# Patient Record
Sex: Male | Born: 1960
Health system: Southern US, Community
[De-identification: ages and names within clinical notes are randomized; demographics above are authoritative.]

## PROBLEM LIST (undated history)

## (undated) DIAGNOSIS — I10 Essential (primary) hypertension: Secondary | ICD-10-CM

## (undated) DIAGNOSIS — E785 Hyperlipidemia, unspecified: Secondary | ICD-10-CM

## (undated) HISTORY — PX: REVISION TOTAL HIP ARTHROPLASTY: SHX766

---

## 2013-06-14 DIAGNOSIS — M169 Osteoarthritis of hip, unspecified: Secondary | ICD-10-CM | POA: Insufficient documentation

## 2013-08-02 ENCOUNTER — Ambulatory Visit: Payer: Self-pay

## 2013-08-02 ENCOUNTER — Ambulatory Visit: Payer: BC Managed Care – PPO | Admitting: Physical Therapy

## 2013-08-10 ENCOUNTER — Ambulatory Visit: Payer: BC Managed Care – PPO | Attending: Orthopaedic Surgery | Admitting: Rehabilitation

## 2013-08-10 DIAGNOSIS — IMO0001 Reserved for inherently not codable concepts without codable children: Secondary | ICD-10-CM | POA: Insufficient documentation

## 2013-08-10 DIAGNOSIS — R262 Difficulty in walking, not elsewhere classified: Secondary | ICD-10-CM | POA: Insufficient documentation

## 2013-08-10 DIAGNOSIS — M25659 Stiffness of unspecified hip, not elsewhere classified: Secondary | ICD-10-CM | POA: Insufficient documentation

## 2013-08-10 DIAGNOSIS — M25559 Pain in unspecified hip: Secondary | ICD-10-CM | POA: Insufficient documentation

## 2013-08-15 ENCOUNTER — Ambulatory Visit: Payer: BC Managed Care – PPO | Admitting: Physical Therapy

## 2013-08-18 ENCOUNTER — Ambulatory Visit: Payer: BC Managed Care – PPO | Admitting: Rehabilitation

## 2013-08-22 ENCOUNTER — Ambulatory Visit: Payer: BC Managed Care – PPO | Admitting: Rehabilitation

## 2013-08-24 ENCOUNTER — Ambulatory Visit: Payer: BC Managed Care – PPO | Admitting: Rehabilitation

## 2013-08-25 ENCOUNTER — Ambulatory Visit: Payer: BC Managed Care – PPO | Admitting: Rehabilitation

## 2013-08-26 ENCOUNTER — Encounter: Payer: BC Managed Care – PPO | Admitting: Rehabilitation

## 2013-08-29 ENCOUNTER — Ambulatory Visit: Payer: BC Managed Care – PPO | Admitting: Rehabilitation

## 2013-08-31 ENCOUNTER — Ambulatory Visit: Payer: BC Managed Care – PPO | Admitting: Rehabilitation

## 2013-09-02 ENCOUNTER — Ambulatory Visit: Payer: BC Managed Care – PPO | Admitting: Rehabilitation

## 2013-09-02 ENCOUNTER — Encounter: Payer: BC Managed Care – PPO | Admitting: Rehabilitation

## 2013-09-05 ENCOUNTER — Ambulatory Visit: Payer: BC Managed Care – PPO | Attending: Orthopaedic Surgery | Admitting: Rehabilitation

## 2013-09-05 DIAGNOSIS — R262 Difficulty in walking, not elsewhere classified: Secondary | ICD-10-CM | POA: Insufficient documentation

## 2013-09-05 DIAGNOSIS — M25659 Stiffness of unspecified hip, not elsewhere classified: Secondary | ICD-10-CM | POA: Insufficient documentation

## 2013-09-05 DIAGNOSIS — IMO0001 Reserved for inherently not codable concepts without codable children: Secondary | ICD-10-CM | POA: Insufficient documentation

## 2013-09-05 DIAGNOSIS — M25559 Pain in unspecified hip: Secondary | ICD-10-CM | POA: Insufficient documentation

## 2013-09-07 ENCOUNTER — Ambulatory Visit: Payer: BC Managed Care – PPO | Admitting: Rehabilitation

## 2013-09-07 DIAGNOSIS — M25559 Pain in unspecified hip: Secondary | ICD-10-CM | POA: Diagnosis not present

## 2013-09-07 DIAGNOSIS — M25659 Stiffness of unspecified hip, not elsewhere classified: Secondary | ICD-10-CM | POA: Diagnosis not present

## 2013-09-07 DIAGNOSIS — R262 Difficulty in walking, not elsewhere classified: Secondary | ICD-10-CM | POA: Diagnosis not present

## 2013-09-07 DIAGNOSIS — IMO0001 Reserved for inherently not codable concepts without codable children: Secondary | ICD-10-CM | POA: Diagnosis present

## 2013-09-09 ENCOUNTER — Encounter: Payer: BC Managed Care – PPO | Admitting: Rehabilitation

## 2013-09-20 ENCOUNTER — Ambulatory Visit: Payer: BC Managed Care – PPO | Admitting: Rehabilitation

## 2013-09-20 DIAGNOSIS — IMO0001 Reserved for inherently not codable concepts without codable children: Secondary | ICD-10-CM | POA: Diagnosis not present

## 2013-09-22 ENCOUNTER — Ambulatory Visit: Payer: BC Managed Care – PPO | Admitting: Rehabilitation

## 2013-09-22 DIAGNOSIS — IMO0001 Reserved for inherently not codable concepts without codable children: Secondary | ICD-10-CM | POA: Diagnosis not present

## 2013-09-27 ENCOUNTER — Ambulatory Visit: Payer: BC Managed Care – PPO | Admitting: Rehabilitation

## 2013-09-27 DIAGNOSIS — IMO0001 Reserved for inherently not codable concepts without codable children: Secondary | ICD-10-CM | POA: Diagnosis not present

## 2013-09-29 ENCOUNTER — Ambulatory Visit: Payer: BC Managed Care – PPO | Admitting: Rehabilitation

## 2013-09-29 DIAGNOSIS — IMO0001 Reserved for inherently not codable concepts without codable children: Secondary | ICD-10-CM | POA: Diagnosis not present

## 2013-10-20 DIAGNOSIS — Z96642 Presence of left artificial hip joint: Secondary | ICD-10-CM

## 2017-04-21 DIAGNOSIS — I10 Essential (primary) hypertension: Secondary | ICD-10-CM | POA: Diagnosis present

## 2017-05-19 DIAGNOSIS — E7800 Pure hypercholesterolemia, unspecified: Secondary | ICD-10-CM | POA: Diagnosis present

## 2017-11-09 ENCOUNTER — Emergency Department (HOSPITAL_BASED_OUTPATIENT_CLINIC_OR_DEPARTMENT_OTHER)
Admission: EM | Admit: 2017-11-09 | Discharge: 2017-11-09 | Disposition: A | Discharge status: Home / Self Care | Payer: BLUE CROSS/BLUE SHIELD | Attending: Emergency Medicine | Admitting: Emergency Medicine

## 2017-11-09 ENCOUNTER — Emergency Department (HOSPITAL_BASED_OUTPATIENT_CLINIC_OR_DEPARTMENT_OTHER): Payer: BLUE CROSS/BLUE SHIELD

## 2017-11-09 ENCOUNTER — Encounter (HOSPITAL_BASED_OUTPATIENT_CLINIC_OR_DEPARTMENT_OTHER): Payer: Self-pay | Admitting: Emergency Medicine

## 2017-11-09 ENCOUNTER — Other Ambulatory Visit: Payer: Self-pay

## 2017-11-09 DIAGNOSIS — R079 Chest pain, unspecified: Secondary | ICD-10-CM | POA: Diagnosis not present

## 2017-11-09 DIAGNOSIS — R0602 Shortness of breath: Secondary | ICD-10-CM | POA: Diagnosis present

## 2017-11-09 DIAGNOSIS — I10 Essential (primary) hypertension: Secondary | ICD-10-CM | POA: Insufficient documentation

## 2017-11-09 DIAGNOSIS — Z79899 Other long term (current) drug therapy: Secondary | ICD-10-CM | POA: Diagnosis not present

## 2017-11-09 HISTORY — DX: Essential (primary) hypertension: I10

## 2017-11-09 LAB — CBC WITH DIFFERENTIAL/PLATELET
Basophils Absolute: 0 10*3/uL (ref 0.0–0.1)
Basophils Relative: 0 %
EOS ABS: 0 10*3/uL (ref 0.0–0.7)
EOS PCT: 0 %
HCT: 42.6 % (ref 39.0–52.0)
HEMOGLOBIN: 14.5 g/dL (ref 13.0–17.0)
Lymphocytes Relative: 18 %
Lymphs Abs: 2.5 10*3/uL (ref 0.7–4.0)
MCH: 29.5 pg (ref 26.0–34.0)
MCHC: 34 g/dL (ref 30.0–36.0)
MCV: 86.6 fL (ref 78.0–100.0)
MONOS PCT: 10 %
Monocytes Absolute: 1.3 10*3/uL — ABNORMAL HIGH (ref 0.1–1.0)
NEUTROS PCT: 72 %
Neutro Abs: 9.7 10*3/uL — ABNORMAL HIGH (ref 1.7–7.7)
PLATELETS: 253 10*3/uL (ref 150–400)
RBC: 4.92 MIL/uL (ref 4.22–5.81)
RDW: 13.7 % (ref 11.5–15.5)
WBC: 13.6 10*3/uL — AB (ref 4.0–10.5)

## 2017-11-09 LAB — BASIC METABOLIC PANEL
Anion gap: 10 (ref 5–15)
BUN: 17 mg/dL (ref 6–20)
CALCIUM: 9.3 mg/dL (ref 8.9–10.3)
CHLORIDE: 101 mmol/L (ref 98–111)
CO2: 24 mmol/L (ref 22–32)
CREATININE: 1.25 mg/dL — AB (ref 0.61–1.24)
GFR calc Af Amer: >60 mL/min (ref >60)
Glucose, Bld: 123 mg/dL — ABNORMAL HIGH (ref 70–99)
Potassium: 3.8 mmol/L (ref 3.5–5.1)
SODIUM: 135 mmol/L (ref 135–145)

## 2017-11-09 LAB — TROPONIN I

## 2017-11-09 MED ORDER — OMEPRAZOLE 20 MG PO CPDR: 20.0000 mg | DELAYED_RELEASE_CAPSULE | Freq: Every day | ORAL | 0 refills | Status: DC

## 2017-11-09 MED FILL — OMEPRAZOLE 20 MG CPDR: 20 | 14 days supply | Qty: 14 | Fill #0

## 2017-11-09 NOTE — ED Provider Notes (Signed)
[facility] EMERGENCY DEPARTMENT Provider Note   CSN: [id number] Arrival date & time: 11/09/17  [id number]     History   Chief Complaint Chief Complaint  Patient presents with  . Shortness of Breath  . Heartburn    HPI Darren Wood is a 57 y.o. male.  HPI Patient presents with chest pain.  Comes from work.  States he was climbing a ladder began to have pain in his chest going up to his neck.  Was nauseous.  Has had nausea on and off today.  States this is his first day back at work in 6 months.  States he had been on light work before this.  Has not done much activity before this.  States where he is working as hot was sweaty.  Pain lasted 10 minutes and is resolved now.  Was a pressure.  States he has had pains like this in the past when he is eaten food that he now does not eat.  States he did not eat that food today.  History of hypertension.  No previous cardiac work-up.  No family history of early cardiac disease. Past Medical History:  Diagnosis Date  . Hypertension     There are no active problems to display for this patient.   Past Surgical History:  Procedure Laterality Date  . REVISION TOTAL HIP ARTHROPLASTY          Home Medications    Prior to Admission medications   Medication Sig Start Date End Date Taking? Authorizing Provider  hydrochlorothiazide (HYDRODIURIL) 12.5 MG tablet Take 12.5 mg by mouth daily. 09/16/17   [provider]  losartan (COZAAR) 50 MG tablet Take 50 mg by mouth daily. 09/16/17   [provider]  omeprazole (PRILOSEC) 20 MG capsule Take 1 capsule (20 mg total) by mouth daily. 11/09/17   Benjiman CorePickering, Keiko Myricks, MD    Family History No family history on file.  Social History Social History   Tobacco Use  . Smoking status: Never Smoker  . Smokeless tobacco: Never Used  Substance Use Topics  . Alcohol use: Not on file  . Drug use: Not on file     Allergies   Patient has no known allergies.   Review of  Systems Review of Systems  Constitutional: Negative for fever.  HENT: Negative for congestion.   Respiratory: Positive for shortness of breath.   Cardiovascular: Positive for chest pain.  Gastrointestinal: Positive for nausea. Negative for vomiting.  Genitourinary: Negative for flank pain.  Musculoskeletal: Negative for back pain.  Neurological: Negative for weakness.  Hematological: Negative for adenopathy.  Psychiatric/Behavioral: Negative for confusion.     Physical Exam Updated Vital Signs BP 125/82   Pulse 80   Temp 98.2 F (36.8 C)   Resp (!) 22   Ht 6' (1.829 m)   Wt 120.2 kg (265 lb)   SpO2 100%   BMI 35.94 kg/m   Physical Exam  Constitutional: He appears well-developed.  HENT:  Head: Normocephalic.  Eyes: Pupils are equal, round, and reactive to light.  Cardiovascular: Normal rate and regular rhythm.  Pulmonary/Chest: Effort normal. He exhibits no tenderness.  Abdominal: There is no tenderness.  Neurological: He is alert.  Skin: Skin is warm. Capillary refill takes less than 2 seconds.  Psychiatric: He has a normal mood and affect.     ED Treatments / Results  Labs (all labs ordered are listed, but only abnormal results are displayed) Labs Reviewed  CBC WITH DIFFERENTIAL/PLATELET - Abnormal; Notable for  the following components:      Result Value   WBC 13.6 (*)    Neutro Abs 9.7 (*)    Monocytes Absolute 1.3 (*)    All other components within normal limits  BASIC METABOLIC PANEL - Abnormal; Notable for the following components:   Glucose, Bld 123 (*)    Creatinine, Ser 1.25 (*)    All other components within normal limits  TROPONIN I  TROPONIN I    EKG EKG Interpretation  Date/Time:  Monday November 09 2017 09:04:44 EDT Ventricular Rate:  98 PR Interval:    QRS Duration: 92 QT Interval:  331 QTC Calculation: 423 R Axis:   122 Text Interpretation:  Sinus rhythm Probable anteroseptal infarct, old No old tracing to compare Confirmed by  Benjiman Core [id number]) on 11/09/2017 9:09:08 AM   Radiology Dg Chest 2 View  Result Date: 11/09/2017 CLINICAL DATA:  Shortness of breath.  Chest pain. EXAM: CHEST - 2 VIEW COMPARISON:  None. FINDINGS: The heart size and mediastinal contours are within normal limits. Both lungs are clear. No pneumothorax or pleural effusion is noted. The visualized skeletal structures are unremarkable. IMPRESSION: No active cardiopulmonary disease. Electronically Signed   By: Lupita Raider, M.D.   On: 11/09/2017 09:33    Procedures Procedures (including critical care time)  Medications Ordered in ED Medications - No data to display   Initial Impression / Assessment and Plan / ED Course  I have reviewed the triage vital signs and the nursing notes.  Pertinent labs & imaging results that were available during my care of the patient were reviewed by me and considered in my medical decision making (see chart for details).     Patient with chest pain while at work.  Began with some exertion but did also feel like previous reflux he has had with certain foods.  EKG reassuring.  Enzymes negative x2.  Has primary care follow-up.  I think he will benefit from outpatient work-up but does not need to come in the hospital for it.  Final Clinical Impressions(s) / ED Diagnoses   Final diagnoses:  Chest pain, unspecified type    ED Discharge Orders        Ordered    omeprazole (PRILOSEC) 20 MG capsule  Daily     11/09/17 1259       Benjiman Core, MD 11/09/17 1559

## 2017-11-09 NOTE — ED Triage Notes (Signed)
Pt states he had been out to work for 6 months and went back to work today.  Pt states he was climbing up a ladder when he started feeling a burning sensation in his esophagus, some sob and some intermittent nausea.  Pt state he has some discomfort in his abdomen when nausea occurs.

## 2017-11-09 NOTE — ED Notes (Signed)
ED Provider at bedside. 

## 2017-11-18 DIAGNOSIS — E669 Obesity, unspecified: Secondary | ICD-10-CM | POA: Diagnosis present

## 2020-02-13 ENCOUNTER — Inpatient Hospital Stay (HOSPITAL_COMMUNITY)
Admission: EM | Admit: 2020-02-13 | Discharge: 2020-02-26 | DRG: 177 | Disposition: A | Discharge status: Home / Self Care | Payer: BC Managed Care – PPO | Attending: Internal Medicine | Admitting: Internal Medicine

## 2020-02-13 ENCOUNTER — Other Ambulatory Visit: Payer: Self-pay

## 2020-02-13 ENCOUNTER — Emergency Department (HOSPITAL_COMMUNITY): Payer: BC Managed Care – PPO

## 2020-02-13 ENCOUNTER — Encounter (HOSPITAL_COMMUNITY): Payer: Self-pay

## 2020-02-13 DIAGNOSIS — R0609 Other forms of dyspnea: Secondary | ICD-10-CM | POA: Diagnosis present

## 2020-02-13 DIAGNOSIS — R0602 Shortness of breath: Secondary | ICD-10-CM

## 2020-02-13 DIAGNOSIS — B370 Candidal stomatitis: Secondary | ICD-10-CM | POA: Diagnosis not present

## 2020-02-13 DIAGNOSIS — J9601 Acute respiratory failure with hypoxia: Secondary | ICD-10-CM | POA: Diagnosis present

## 2020-02-13 DIAGNOSIS — I10 Essential (primary) hypertension: Secondary | ICD-10-CM | POA: Diagnosis present

## 2020-02-13 DIAGNOSIS — J690 Pneumonitis due to inhalation of food and vomit: Secondary | ICD-10-CM | POA: Diagnosis not present

## 2020-02-13 DIAGNOSIS — J1282 Pneumonia due to coronavirus disease 2019: Secondary | ICD-10-CM | POA: Diagnosis present

## 2020-02-13 DIAGNOSIS — Z6832 Body mass index (BMI) 32.0-32.9, adult: Secondary | ICD-10-CM | POA: Diagnosis not present

## 2020-02-13 DIAGNOSIS — E669 Obesity, unspecified: Secondary | ICD-10-CM | POA: Diagnosis present

## 2020-02-13 DIAGNOSIS — Z96642 Presence of left artificial hip joint: Secondary | ICD-10-CM

## 2020-02-13 DIAGNOSIS — E7800 Pure hypercholesterolemia, unspecified: Secondary | ICD-10-CM | POA: Diagnosis present

## 2020-02-13 DIAGNOSIS — U099 Post covid-19 condition, unspecified: Secondary | ICD-10-CM | POA: Diagnosis present

## 2020-02-13 DIAGNOSIS — U071 COVID-19: Secondary | ICD-10-CM | POA: Diagnosis present

## 2020-02-13 DIAGNOSIS — E785 Hyperlipidemia, unspecified: Secondary | ICD-10-CM | POA: Diagnosis present

## 2020-02-13 DIAGNOSIS — R0902 Hypoxemia: Secondary | ICD-10-CM

## 2020-02-13 LAB — CBC WITH DIFFERENTIAL/PLATELET
Abs Immature Granulocytes: 0.05 10*3/uL (ref 0.00–0.07)
Basophils Absolute: 0 10*3/uL (ref 0.0–0.1)
Basophils Relative: 0 %
Eosinophils Absolute: 0 10*3/uL (ref 0.0–0.5)
Eosinophils Relative: 0 %
HCT: 43.3 % (ref 39.0–52.0)
Hemoglobin: 14.2 g/dL (ref 13.0–17.0)
Immature Granulocytes: 1 %
Lymphocytes Relative: 17 %
Lymphs Abs: 1.5 10*3/uL (ref 0.7–4.0)
MCH: 28.7 pg (ref 26.0–34.0)
MCHC: 32.8 g/dL (ref 30.0–36.0)
MCV: 87.7 fL (ref 80.0–100.0)
Monocytes Absolute: 1 10*3/uL (ref 0.1–1.0)
Monocytes Relative: 10 %
Neutro Abs: 6.6 10*3/uL (ref 1.7–7.7)
Neutrophils Relative %: 72 %
Platelets: 256 10*3/uL (ref 150–400)
RBC: 4.94 MIL/uL (ref 4.22–5.81)
RDW: 14.3 % (ref 11.5–15.5)
WBC: 9.1 10*3/uL (ref 4.0–10.5)
nRBC: 0 % (ref 0.0–0.2)

## 2020-02-13 LAB — PROCALCITONIN: Procalcitonin: 0.13 ng/mL

## 2020-02-13 LAB — COMPREHENSIVE METABOLIC PANEL
ALT: 35 U/L (ref 0–44)
AST: 59 U/L — ABNORMAL HIGH (ref 15–41)
Albumin: 3.4 g/dL — ABNORMAL LOW (ref 3.5–5.0)
Alkaline Phosphatase: 59 U/L (ref 38–126)
Anion gap: 13 (ref 5–15)
BUN: 32 mg/dL — ABNORMAL HIGH (ref 8–23)
CO2: 24 mmol/L (ref 22–32)
Calcium: 8.2 mg/dL — ABNORMAL LOW (ref 8.9–10.3)
Chloride: 100 mmol/L (ref 98–111)
Creatinine, Ser: 1.09 mg/dL (ref 0.61–1.24)
GFR, Estimated: >60 mL/min (ref >60)
Glucose, Bld: 117 mg/dL — ABNORMAL HIGH (ref 70–99)
Potassium: 3.9 mmol/L (ref 3.5–5.1)
Sodium: 137 mmol/L (ref 135–145)
Total Bilirubin: 0.8 mg/dL (ref 0.3–1.2)
Total Protein: 7.4 g/dL (ref 6.5–8.1)

## 2020-02-13 LAB — RESPIRATORY PANEL BY RT PCR (FLU A&B, COVID)
Influenza A by PCR: NEGATIVE
Influenza B by PCR: NEGATIVE
SARS Coronavirus 2 by RT PCR: POSITIVE — AB

## 2020-02-13 LAB — URINALYSIS, ROUTINE W REFLEX MICROSCOPIC
Bilirubin Urine: NEGATIVE
Glucose, UA: NEGATIVE mg/dL
Hgb urine dipstick: NEGATIVE
Ketones, ur: NEGATIVE mg/dL
Leukocytes,Ua: NEGATIVE
Nitrite: NEGATIVE
Protein, ur: NEGATIVE mg/dL
Specific Gravity, Urine: 1.029 (ref 1.005–1.030)
pH: 5 (ref 5.0–8.0)

## 2020-02-13 LAB — PROTIME-INR
INR: 1.1 (ref 0.8–1.2)
Prothrombin Time: 13.5 seconds (ref 11.4–15.2)

## 2020-02-13 LAB — C-REACTIVE PROTEIN: CRP: 16.5 mg/dL — ABNORMAL HIGH (ref <1.0)

## 2020-02-13 LAB — TRIGLYCERIDES: Triglycerides: 151 mg/dL — ABNORMAL HIGH (ref <150)

## 2020-02-13 LAB — FERRITIN: Ferritin: 1485 ng/mL — ABNORMAL HIGH (ref 24–336)

## 2020-02-13 LAB — FIBRINOGEN: Fibrinogen: 603 mg/dL — ABNORMAL HIGH (ref 210–475)

## 2020-02-13 LAB — D-DIMER, QUANTITATIVE: D-Dimer, Quant: 0.28 ug/mL-FEU (ref 0.00–0.50)

## 2020-02-13 LAB — LACTIC ACID, PLASMA: Lactic Acid, Venous: 1.4 mmol/L (ref 0.5–1.9)

## 2020-02-13 LAB — LACTATE DEHYDROGENASE: LDH: 446 U/L — ABNORMAL HIGH (ref 98–192)

## 2020-02-13 MED ORDER — ENOXAPARIN SODIUM 40 MG/0.4ML ~~LOC~~ SOLN
40.0000 mg | SUBCUTANEOUS | Status: DC
  Administered 2020-02-13 – 2020-02-21 (×9): 40 mg via SUBCUTANEOUS
  Filled 2020-02-13 (×9): qty 0.4

## 2020-02-13 MED ORDER — DEXAMETHASONE SODIUM PHOSPHATE 10 MG/ML IJ SOLN
10.0000 mg | Freq: Once | INTRAMUSCULAR | Status: AC
  Administered 2020-02-13: 10 mg via INTRAVENOUS
  Filled 2020-02-13: qty 1

## 2020-02-13 MED ORDER — SODIUM CHLORIDE 0.9 % IV SOLN
100.0000 mg | Freq: Every day | INTRAVENOUS | Status: AC
  Administered 2020-02-14 – 2020-02-17 (×4): 100 mg via INTRAVENOUS
  Filled 2020-02-13 (×4): qty 20

## 2020-02-13 MED ORDER — LACTATED RINGERS IV SOLN: INTRAVENOUS | Status: DC

## 2020-02-13 MED ORDER — DEXAMETHASONE SODIUM PHOSPHATE 10 MG/ML IJ SOLN: 6.0000 mg | INTRAMUSCULAR | Status: DC

## 2020-02-13 MED ORDER — ONDANSETRON HCL 4 MG PO TABS: 4.0000 mg | ORAL_TABLET | Freq: Four times a day (QID) | ORAL | Status: DC | PRN

## 2020-02-13 MED ORDER — HYDROCOD POLST-CPM POLST ER 10-8 MG/5ML PO SUER
5.0000 mL | Freq: Two times a day (BID) | ORAL | Status: DC | PRN
  Administered 2020-02-13 – 2020-02-17 (×3): 5 mL via ORAL
  Filled 2020-02-13 (×3): qty 5

## 2020-02-13 MED ORDER — GUAIFENESIN-DM 100-10 MG/5ML PO SYRP
10.0000 mL | ORAL_SOLUTION | ORAL | Status: DC | PRN
  Administered 2020-02-16: 10 mL via ORAL
  Filled 2020-02-13: qty 10

## 2020-02-13 MED ORDER — ENSURE ENLIVE PO LIQD
237.0000 mL | Freq: Two times a day (BID) | ORAL | Status: DC
  Administered 2020-02-14 – 2020-02-25 (×22): 237 mL via ORAL

## 2020-02-13 MED ORDER — SODIUM CHLORIDE 0.9 % IV SOLN
200.0000 mg | Freq: Once | INTRAVENOUS | Status: AC
  Administered 2020-02-13: 200 mg via INTRAVENOUS
  Filled 2020-02-13: qty 200

## 2020-02-13 MED ORDER — ACETAMINOPHEN 325 MG PO TABS
650.0000 mg | ORAL_TABLET | Freq: Four times a day (QID) | ORAL | Status: DC | PRN
  Filled 2020-02-13: qty 2

## 2020-02-13 MED ORDER — ONDANSETRON HCL 4 MG/2ML IJ SOLN
4.0000 mg | Freq: Four times a day (QID) | INTRAMUSCULAR | Status: DC | PRN
  Administered 2020-02-13: 4 mg via INTRAVENOUS
  Filled 2020-02-13: qty 2

## 2020-02-13 NOTE — H&P (Signed)
History and Physical    Darren Wood RFF:[id number] DOB: 1960-08-04 DOA: 02/13/2020  PCP: Loyal Jacobson, MD   Patient coming from: Home/EMS  Chief Complaint  Patient presents with  . Covid Positive  . Shortness of Breath     HPI: Darren Wood is a 59 y.o. male with medical history significant for hypertension, morbid obesity BMI 32, hypercholesterolemia, DJD, who reports he tested positive for Covid on 02/07/2020 and was managing at home brought to the ED by EMS with complaint of fatigue, body aches, malaise, shortness of breath along with mild cough and fever.  Symptom onset October 1 initially mild, dilated COVID-19/5, has been taking Tylenol for fever, has been having dyspnea mostly with activity.  Patient denies any chest pain neck pain abdominal pain lightheadedness dizziness but has been having mild nausea and few episodes of loose stool which are intermittent and non-bloody. He was given 1 L normal saline by EMS  ED Course: Blood pressure stable, tachypneic 35-47 upon presentation, pulse ox 72-83% on RA, HR 90s, Tmax 98.2.  Chest x-ray showed hazy and patchy opacities in the mid to lower lungs and..  The left lung base with some atelectatic changes, concerning for developing pneumonia in the setting of COVID-19 positivity.Routine labs showed elevated BUN 32 lactic acid normal 1.4, normal CBC. COVID-19 RT-PCR positive in ED.Blood culture were sent.  Patient was placed on nonrebreather and saturating in 97s, D1 remdesivir and Decadron.  Tachypnea improving in 22, no use of accessory muscle. Admission is requested for further management. On exam in ED, aaox4, not in distress,resting comfortably.  Of note he is unvaccinated for COVID-19 He is thinking of getting vaccine this time  Review of Systems: All systems were reviewed and were negative except as mentioned in HPI above. Negative for ABDOMEN PAIN Negative for chest pain Negative for Focal weakness  Past Medical History:    Diagnosis Date  . Hypertension     Past Surgical History:  Procedure Laterality Date  . REVISION TOTAL HIP ARTHROPLASTY       reports that he has never smoked. He has never used smokeless tobacco. No history on file for alcohol use and drug use.  No Known Allergies  History reviewed. No pertinent family history.   Prior to Admission medications   Medication Sig Start Date End Date Taking? Authorizing Provider  hydrochlorothiazide (HYDRODIURIL) 12.5 MG tablet Take 12.5 mg by mouth daily. 09/16/17   [provider]  losartan (COZAAR) 50 MG tablet Take 50 mg by mouth daily. 09/16/17   [provider]  omeprazole (PRILOSEC) 20 MG capsule Take 1 capsule (20 mg total) by mouth daily. 11/09/17   Benjiman Core, MD    Physical Exam: Vitals:   02/13/20 1730 02/13/20 1745 02/13/20 1811 02/13/20 1815  BP: 110/74 108/67  115/76  Pulse: 88 88 86 88  Resp: (!) 26 (!) 42 (!) 32 (!) 35  Temp:      TempSrc:      SpO2: 92% 96% 94% 90%  Weight:      Height:        General exam: AAOx3 ,Obese,NAD, weak appearing. HEENT:Oral mucosa moist, Ear/Nose WNL grossly, dentition normal. Respiratory system: bilaterally clear,no wheezing or crackles,no use of accessory muscle Cardiovascular system: S1 & S2 +, No JVD,. Gastrointestinal system: Abdomen soft, NT,ND, BS+ Nervous System:Alert, awake, moving extremities and grossly nonfocal Extremities: No edema, distal peripheral pulses palpable.  Skin: No rashes,no icterus. MSK: Normal muscle bulk,tone, power   Labs on Admission: I have  personally reviewed following labs and imaging studies  CBC: Recent Labs  Lab 02/13/20 1453  WBC 9.1  NEUTROABS 6.6  HGB 14.2  HCT 43.3  MCV 87.7  PLT 256   Basic Metabolic Panel: Recent Labs  Lab 02/13/20 1453  NA 137  K 3.9  CL 100  CO2 24  GLUCOSE 117*  BUN 32*  CREATININE 1.09  CALCIUM 8.2*   GFR: Estimated Creatinine Clearance: 91.2 mL/min (by C-G formula based on SCr of  1.09 mg/dL). Liver Function Tests: Recent Labs  Lab 02/13/20 1453  AST 59*  ALT 35  ALKPHOS 59  BILITOT 0.8  PROT 7.4  ALBUMIN 3.4*   No results for input(s): LIPASE, AMYLASE in the last 168 hours. No results for input(s): AMMONIA in the last 168 hours. Coagulation Profile: Recent Labs  Lab 02/13/20 1453  INR 1.1   Cardiac Enzymes: No results for input(s): CKTOTAL, CKMB, CKMBINDEX, TROPONINI in the last 168 hours. BNP (last 3 results) No results for input(s): PROBNP in the last 8760 hours. HbA1C: No results for input(s): HGBA1C in the last 72 hours. CBG: No results for input(s): GLUCAP in the last 168 hours. Lipid Profile: No results for input(s): CHOL, HDL, LDLCALC, TRIG, CHOLHDL, LDLDIRECT in the last 72 hours. Thyroid Function Tests: No results for input(s): TSH, T4TOTAL, FREET4, T3FREE, THYROIDAB in the last 72 hours. Anemia Panel: No results for input(s): VITAMINB12, FOLATE, FERRITIN, TIBC, IRON, RETICCTPCT in the last 72 hours. Urine analysis: No results found for: COLORURINE, APPEARANCEUR, LABSPEC, PHURINE, GLUCOSEU, HGBUR, BILIRUBINUR, KETONESUR, PROTEINUR, UROBILINOGEN, NITRITE, LEUKOCYTESUR  Radiological Exams on Admission: DG Chest Port 1 View  Result Date: 02/13/2020 CLINICAL DATA:  COVID-19 positive on 02/07/2020, cough, no nausea EXAM: PORTABLE CHEST 1 VIEW COMPARISON:  Radiograph 11/09/2017 FINDINGS: Some hazy and patchy opacities are present in the mid to lower lungs and periphery of the left lung base likely with some atelectatic changes as well given low lung volumes. No pneumothorax or visible effusion. The cardiomediastinal contours are unremarkable. No acute osseous or soft tissue abnormality. Degenerative changes are present in the imaged spine and shoulders. Telemetry leads overlie the chest. IMPRESSION: 1. Features concerning for developing pneumonia, in the setting of COVID-19 positivity. 2. Additionally, the lung volumes are low with atelectatic  changes as well. Electronically Signed   By: Kreg Shropshire M.D.   On: 02/13/2020 15:19     Assessment/Plan  Pneumonia due to COVID-19 virus/Acute respiratory failure with hypoxia: We will admit the patient, start her on receiving Decadron, DVT prophylaxis, supplemental oxygen.  Inflammatory markers including CRP D-dimer LDH are pending and monitor serially including lfts.  Keep on airborne precaution.  In case of further worsening of hypoxia/clinical status will consider additional therapies. He agrees with current treatment plan. COVID-19 Labs-pending inflammatory markers No results for input(s): DDIMER, FERRITIN, LDH, CRP in the last 72 hours.  Lab Results  Component Value Date   SARSCOV2NAA POSITIVE (A) 02/13/2020   Essential hypertension: BP is controlled we will continue to hold his HCTZ and losartan.  Nausea and mild episode of diarrhea:Add gentle IV fluids OVERNIGHT as he has elevated BUN but renal function overall stable. hctz on hold  DJD/History of total hip arthroplasty, left:prn tylenol  Obesity W/ BMI 32:will benefit with weight loss once acute issues resolve.  Pure hypercholesterolemia:not on meds  The treatment plan and use of medications and known side effects were discussed with patient/family, they were clearly explained that there is no proven definitive treatment for COVID-19 infection, any  medications used here are based on published clinical articles/anecdotal data which are not peer-reviewed or randomized control trials.  Complete risks and long-term side effects are unknown, however in the best clinical judgment they seem to be of some clinical benefit rather than medical risks.  Patient/family agree with the treatment plan and want to receive the given medications.  Severity of Illness: * I certify that at the point of admission it is my clinical judgment that the patient will require inpatient hospital care spanning beyond 2 midnights from the point of admission  due to high intensity of service, high risk for further deterioration and high frequency of surveillance required in the setting of acute hypoxic respiratory failure and COVID-19 infection    DVT prophylaxis: enoxaparin (LOVENOX) injection 40 mg Start: 02/13/20 1830 Code Status:   Code Status: Full Code  Family Communication: Admission, patients condition and plan of care including tests being ordered have been discussed with the patient who indicate understanding and agree with the plan and Code Status.  Consults called:   Lanae Boast MD [facility]  If 7PM-7AM, please contact night-coverage [email or web address]  02/13/2020, 6:18 PM

## 2020-02-13 NOTE — ED Provider Notes (Signed)
[facility]-EMERGENCY DEPT Provider Note   CSN: [id number] Arrival date & time: 02/13/20  1415     History Chief Complaint  Patient presents with  . Covid Positive  . Shortness of Breath    Darren Wood is a 59 y.o. male.  HPI  Patient is a 59 year old male with past medical history of hypertension on hydrochlorothiazide and today with fatigue, malaise, shortness of breath, body aches, mild cough and fevers.  He states that his symptoms began on October 1 relatively mild seem to have worsened since that time.  He states no abrupt changes.  He denies any chest pain states that he does not feel significantly short of breath unless he exerts himself.  He has been taking Tylenol for fever which has been useful last dose was 2 hours ago.  He has never taking hydrochlorothiazide at the direction of his primary care doctor.  He states he was tested +10/5 however states he has no way to show this test result because he does not have his phone with him because he was brought by EMS.  Denies any lightheadedness, dizziness, chest pain, neck pain, abdominal pain.  He has had some mild nausea and a couple loose stools however he states that these have been intermittent and nonbloody.  No unilateral leg swelling or history of VTE.     Past Medical History:  Diagnosis Date  . Hypertension     There are no problems to display for this patient.   Past Surgical History:  Procedure Laterality Date  . REVISION TOTAL HIP ARTHROPLASTY         History reviewed. No pertinent family history.  Social History   Tobacco Use  . Smoking status: Never Smoker  . Smokeless tobacco: Never Used  Substance Use Topics  . Alcohol use: Not on file  . Drug use: Not on file    Home Medications Prior to Admission medications   Medication Sig Start Date End Date Taking? Authorizing Provider  hydrochlorothiazide (HYDRODIURIL) 12.5 MG tablet Take 12.5 mg by mouth daily. 09/16/17    [provider]  losartan (COZAAR) 50 MG tablet Take 50 mg by mouth daily. 09/16/17   [provider]  omeprazole (PRILOSEC) 20 MG capsule Take 1 capsule (20 mg total) by mouth daily. 11/09/17   Benjiman Core, MD    Allergies    Patient has no known allergies.  Review of Systems   Review of Systems  Constitutional: Positive for fatigue and fever. Negative for chills.  HENT: Negative for congestion.   Eyes: Negative for pain.  Respiratory: Positive for cough and shortness of breath.   Cardiovascular: Negative for chest pain and leg swelling.  Gastrointestinal: Positive for diarrhea and nausea. Negative for abdominal pain and vomiting.  Genitourinary: Negative for dysuria.  Musculoskeletal: Positive for myalgias.  Skin: Negative for rash.  Neurological: Negative for dizziness and headaches.    Physical Exam Updated Vital Signs BP 121/82 (BP Location: Right Arm)   Pulse 97   Temp 98.2 F (36.8 C) (Oral)   Resp (!) 35   SpO2 91%   Physical Exam Vitals and nursing note reviewed.  Constitutional:      General: He is not in acute distress.    Appearance: He is obese.  HENT:     Head: Normocephalic and atraumatic.     Nose: Nose normal.  Eyes:     General: No scleral icterus. Cardiovascular:     Rate and Rhythm: Normal rate and regular  rhythm.     Pulses: Normal pulses.     Heart sounds: Normal heart sounds.     Comments: Pulses bilateral radial and DP pulses are symmetric.  No significant tachycardia heart rate is approximately 80-90. Pulmonary:     Effort: Pulmonary effort is normal. No respiratory distress.     Breath sounds: Rales present. No wheezing.     Comments: Mild tachypnea with respiratory rate of approximately 30.  No significant accessory muscle usage.  Crackles diffusely auscultated in all fields. Pt SpO2 88% on 6L [place]. Changed to 10L NRB. SpO2 now 92-94%. Abdominal:     Palpations: Abdomen is soft.     Tenderness: There is no abdominal  tenderness. There is no guarding or rebound.  Musculoskeletal:     Cervical back: Normal range of motion.     Right lower leg: No edema.     Left lower leg: No edema.  Skin:    General: Skin is warm and dry.     Capillary Refill: Capillary refill takes less than 2 seconds.  Neurological:     Mental Status: He is alert. Mental status is at baseline.  Psychiatric:        Mood and Affect: Mood normal.        Behavior: Behavior normal.     ED Results / Procedures / Treatments   Labs (all labs ordered are listed, but only abnormal results are displayed) Labs Reviewed  CULTURE, BLOOD (ROUTINE X 2)  CULTURE, BLOOD (ROUTINE X 2)  RESPIRATORY PANEL BY RT PCR (FLU A&B, COVID)  CBC WITH DIFFERENTIAL/PLATELET  PROTIME-INR  COMPREHENSIVE METABOLIC PANEL  LACTIC ACID, PLASMA  LACTIC ACID, PLASMA  URINALYSIS, ROUTINE W REFLEX MICROSCOPIC    EKG None  Radiology DG Chest Port 1 View  Result Date: 02/13/2020 CLINICAL DATA:  COVID-19 positive on 02/07/2020, cough, no nausea EXAM: PORTABLE CHEST 1 VIEW COMPARISON:  Radiograph 11/09/2017 FINDINGS: Some hazy and patchy opacities are present in the mid to lower lungs and periphery of the left lung base likely with some atelectatic changes as well given low lung volumes. No pneumothorax or visible effusion. The cardiomediastinal contours are unremarkable. No acute osseous or soft tissue abnormality. Degenerative changes are present in the imaged spine and shoulders. Telemetry leads overlie the chest. IMPRESSION: 1. Features concerning for developing pneumonia, in the setting of COVID-19 positivity. 2. Additionally, the lung volumes are low with atelectatic changes as well. Electronically Signed   By: Kreg Shropshire M.D.   On: 02/13/2020 15:19    Procedures .Critical Care Performed by: Gailen Shelter, PA Authorized by: Gailen Shelter, PA   Critical care provider statement:    Critical care time (minutes):  35   Critical care time was  exclusive of:  Separately billable procedures and treating other patients and teaching time   Critical care was necessary to treat or prevent imminent or life-threatening deterioration of the following conditions:  Respiratory failure   Critical care was time spent personally by me on the following activities:  Discussions with consultants, evaluation of patient's response to treatment, examination of patient, review of old charts, re-evaluation of patient's condition, pulse oximetry, ordering and review of radiographic studies, ordering and review of laboratory studies and ordering and performing treatments and interventions   I assumed direction of critical care for this patient from another provider in my specialty: no   Comments:     Hypoxic respiratory failure secondary to COVID-19 infection.   (including critical care time)  Medications  Ordered in ED Medications  dexamethasone (DECADRON) injection 10 mg (has no administration in time range)    ED Course  I have reviewed the triage vital signs and the nursing notes.  Pertinent labs & imaging results that were available during my care of the patient were reviewed by me and considered in my medical decision making (see chart for details).  Patient is unvaccinated 59 year old male with past medical history hypertension presented today for increasing worsening shortness of breath.  He also has fatigue and exertional fatigue and weakness.  Physical exam is notable for significant crackles in all fields.  He is also significantly hypoxic.  Requiring significant oxygen.  His chest x-ray shows evidence of Covid pneumonia.  No focal infiltrate and he has no leukocytosis and is afebrile although he did take Tylenol earlier today.  Procalcitonin has not resulted yet.  I communicated my work-up and results to hospitalist who admit patient for hypoxic respiratory failure secondary to COVID-19.  Clinical Course as of Feb 12 1726  Mon Feb 13, 2020  1702  CMP notable for mildly elevated BUN likely secondary to poor p.o. intake.  Calcium mildly low at 8.2.  Very mild AST transaminitis.  Comprehensive metabolic panel(!) [WF]  1703 CBC without leukocytosis or anemia or leukopenia.  CBC with Differential [WF]  1703 Lactic acid within normal notes.  Coags within normal notes.   [WF]  1703 EKG without any significant changes from prior.   [WF]  1703 Chest x-ray reviewed by myself agree with radiology read is consistent with COVID-19.  Has had positive test at outside facility.   [WF]  1725 Discussed with Dr. Jonathon Bellows of hospitalist service who will admit patient for hypoxic respiratory failure.  Patient has already received remdesivir and Decadron.   [WF]    Clinical Course User Index [WF] Gailen Shelter, [place]   Patient initially found to be hypoxic in the mid 80s on 6 L nasal cannula.  I placed patient on nonrebreather at 10 L.  Now seems to be satting in the mid to high 90s improvement in tachypnea.  MDM Rules/Calculators/A&P                          Darren Wood was evaluated in Emergency Department on 02/13/2020 for the symptoms described in the history of present illness. He was evaluated in the context of the global COVID-19 pandemic, which necessitated consideration that the patient might be at risk for infection with the SARS-CoV-2 virus that causes COVID-19. Institutional protocols and algorithms that pertain to the evaluation of patients at risk for COVID-19 are in a state of rapid change based on information released by regulatory bodies including the CDC and federal and state organizations. These policies and algorithms were followed during the patient's care in the ED.  Final Clinical Impression(s) / ED Diagnoses Final diagnoses:  SOB (shortness of breath)    Rx / DC Orders ED Discharge Orders    None       Gailen Shelter, [place] 02/13/20 1729    Benjiman Core, MD 02/14/20 0010

## 2020-02-13 NOTE — ED Triage Notes (Signed)
Pt arrived via [facility]. Pt tested COVID+ on 10/5. Pt has had a mild cough. Pt reports nausea, but no vomiting for the past 3 days. Pt also reports feeling more weak when sitting and standing. EMS checked orthostatics but were negative.   Vitals: O2: 72-83% on RA Currently on 15L NRB at 99% CBG: 134 RR: 30-40  EMS gave 1000 mL NS  20G right hand

## 2020-02-13 NOTE — ED Notes (Signed)
ED TO INPATIENT HANDOFF REPORT  Name/Age/Gender Darren Wood 59 y.o. male  Code Status    Code Status Orders  (From admission, onward)         Start     Ordered   02/13/20 1817  Full code  Continuous        02/13/20 1817        Code Status History    This patient has a current code status but no historical code status.   Advance Care Planning Activity      Home/SNF/Other Home  Chief Complaint Pneumonia due to COVID-19 virus [U07.1, J12.82]  Level of Care/Admitting Diagnosis ED Disposition    ED Disposition Condition Comment   Admit  Hospital Area: [facility] [[id number]]  Level of Care: Progressive [102]  Admit to Progressive based on following criteria: RESPIRATORY PROBLEMS hypoxemic/hypercapnic respiratory failure that is responsive to NIPPV (BiPAP) or High Flow Nasal Cannula (6-80 lpm). Frequent assessment/intervention, no > Q2 hrs < Q4 hrs, to maintain oxygenation and pulmonary hygiene.  May admit patient to [facility] or [facility] if equivalent level of care is available:: No  Covid Evaluation: Confirmed COVID Positive  Diagnosis: Pneumonia due to COVID-19 virus [[id number]]  Admitting Physician: Lanae Boast [[id number]]  Attending Physician: Lanae Boast [[id number]]  Estimated length of stay: 3 - 4 days  Certification:: I certify this patient will need inpatient services for at least 2 midnights       Medical History Past Medical History:  Diagnosis Date  . Hypertension     Allergies No Known Allergies  IV Location/Drains/Wounds Patient Lines/Drains/Airways Status    Active Line/Drains/Airways    Name Placement date Placement time Site Days   Peripheral IV 02/13/20 Left Antecubital 02/13/20  1452  Antecubital  less than 1   Peripheral IV 02/13/20 Right Hand 02/13/20  --  Hand  less than 1          Labs/Imaging Results for orders placed or performed during the hospital encounter of 02/13/20 (from the past 48 hour(s))   Comprehensive metabolic panel     Status: Abnormal   Collection Time: 02/13/20  2:53 PM  Result Value Ref Range   Sodium 137 135 - 145 mmol/L   Potassium 3.9 3.5 - 5.1 mmol/L   Chloride 100 98 - 111 mmol/L   CO2 24 22 - 32 mmol/L   Glucose, Bld 117 (H) 70 - 99 mg/dL    Comment: Glucose reference range applies only to samples taken after fasting for at least 8 hours.   BUN 32 (H) 8 - 23 mg/dL   Creatinine, Ser 2.13 0.61 - 1.24 mg/dL   Calcium 8.2 (L) 8.9 - 10.3 mg/dL   Total Protein 7.4 6.5 - 8.1 g/dL   Albumin 3.4 (L) 3.5 - 5.0 g/dL   AST 59 (H) 15 - 41 U/L   ALT 35 0 - 44 U/L   Alkaline Phosphatase 59 38 - 126 U/L   Total Bilirubin 0.8 0.3 - 1.2 mg/dL   GFR, Estimated >08 >65 mL/min   Anion gap 13 5 - 15    Comment: Performed at [facility], [address] [place], [place] [address]  CBC with Differential     Status: None   Collection Time: 02/13/20  2:53 PM  Result Value Ref Range   WBC 9.1 4.0 - 10.5 K/uL   RBC 4.94 4.22 - 5.81 MIL/uL   Hemoglobin 14.2 13.0 - 17.0 g/dL   HCT 62.9 39 -  52 %   MCV 87.7 80.0 - 100.0 fL   MCH 28.7 26.0 - 34.0 pg   MCHC 32.8 30.0 - 36.0 g/dL   RDW 68.1 27.5 - 17.0 %   Platelets 256 150 - 400 K/uL   nRBC 0.0 0.0 - 0.2 %   Neutrophils Relative % 72 %   Neutro Abs 6.6 1.7 - 7.7 K/uL   Lymphocytes Relative 17 %   Lymphs Abs 1.5 0.7 - 4.0 K/uL   Monocytes Relative 10 %   Monocytes Absolute 1.0 0.1 - 1.0 K/uL   Eosinophils Relative 0 %   Eosinophils Absolute 0.0 0 - 0 K/uL   Basophils Relative 0 %   Basophils Absolute 0.0 0 - 0 K/uL   Immature Granulocytes 1 %   Abs Immature Granulocytes 0.05 0.00 - 0.07 K/uL    Comment: Performed at [facility], [address], [place], [place] [address]  Protime-INR     Status: None   Collection Time: 02/13/20  2:53 PM  Result Value Ref Range   Prothrombin Time 13.5 11.4 - 15.2 seconds   INR 1.1 0.8 - 1.2    Comment: (NOTE) INR goal varies based on device and  disease states. Performed at [facility], [address] [place], [place] [address]   Lactic acid, plasma     Status: None   Collection Time: 02/13/20  2:55 PM  Result Value Ref Range   Lactic Acid, Venous 1.4 0.5 - 1.9 mmol/L    Comment: Performed at [facility], [address], [place], [place] [address]  Respiratory Panel by RT PCR (Flu A&B, Covid) - Nasopharyngeal Swab     Status: Abnormal   Collection Time: 02/13/20  4:16 PM   Specimen: Nasopharyngeal Swab  Result Value Ref Range   SARS Coronavirus 2 by RT PCR POSITIVE (A) NEGATIVE    Comment: RESULT CALLED TO, READ BACK BY AND VERIFIED WITH: ZULETTA,K RN @1731  ON 02/13/20 JACKSON,K (NOTE) SARS-CoV-2 target nucleic acids are DETECTED.  SARS-CoV-2 RNA is generally detectable in upper respiratory specimens  during the acute phase of infection. Positive results are indicative of the presence of the identified virus, but do not rule out bacterial infection or co-infection with other pathogens not detected by the test. Clinical correlation with patient history and other diagnostic information is necessary to determine patient infection status. The expected result is Negative.  Fact Sheet for Patients:  04/14/20  Fact Sheet for Healthcare Providers: https://www.moore.com/  This test is not yet approved or cleared by the https://www.young.biz/ FDA and  has been authorized for detection and/or diagnosis of SARS-CoV-2 by FDA under an Emergency Use Authorization (EUA).  This EUA will remain in effect (meaning this test c an be used) for the duration of  the COVID-19 declaration under Section 564(b)(1) of the Act, 21 U.S.C. section 360bbb-3(b)(1), unless the authorization is terminated or revoked sooner.      Influenza A by PCR NEGATIVE NEGATIVE   Influenza B by PCR NEGATIVE NEGATIVE    Comment: (NOTE) The Xpert Xpress SARS-CoV-2/FLU/RSV assay  is intended as an aid in  the diagnosis of influenza from Nasopharyngeal swab specimens and  should not be used as a sole basis for treatment. Nasal washings and  aspirates are unacceptable for Xpert Xpress SARS-CoV-2/FLU/RSV  testing.  Fact Sheet for Patients: [place]  Fact Sheet for Healthcare Providers: https://www.moore.com/  This test is not yet approved or cleared by the https://www.young.biz/ and  has been authorized for detection and/or diagnosis of SARS-CoV-2 by  FDA under an Emergency Use Authorization (EUA). This EUA will remain  in effect (meaning this test can be used) for the duration of the  Covid-19 declaration under Section 564(b)(1) of the Act, 21  U.S.C. section 360bbb-3(b)(1), unless the authorization is  terminated or revoked. Performed at [facility], [address] [place], [place] [address]   C-reactive protein     Status: Abnormal   Collection Time: 02/13/20  5:09 PM  Result Value Ref Range   CRP 16.5 (H) <1.0 mg/dL    Comment: Performed at [facility], [address] [place], [place] [address]  D-dimer, quantitative     Status: None   Collection Time: 02/13/20  5:09 PM  Result Value Ref Range   D-Dimer, Quant 0.28 0.00 - 0.50 ug/mL-FEU    Comment: (NOTE) At the manufacturer cut-off value of 0.5 g/mL FEU, this assay has a negative predictive value of 95-100%.This assay is intended for use in conjunction with a clinical pretest probability (PTP) assessment model to exclude pulmonary embolism (PE) and deep venous thrombosis (DVT) in outpatients suspected of PE or DVT. Results should be correlated with clinical presentation. Performed at [facility], [address], [place], [place] [address]   Ferritin     Status: Abnormal   Collection Time: 02/13/20  5:09 PM  Result Value Ref Range   Ferritin 1,485 (H) 24 - 336 ng/mL    Comment: Performed  at [facility], [address], [place], [place] [address]  Fibrinogen     Status: Abnormal   Collection Time: 02/13/20  5:09 PM  Result Value Ref Range   Fibrinogen 603 (H) 210 - 475 mg/dL    Comment: Performed at [facility], [address] [place], [place] [address]  Procalcitonin     Status: None   Collection Time: 02/13/20  5:09 PM  Result Value Ref Range   Procalcitonin 0.13 ng/mL    Comment:        Interpretation: PCT (Procalcitonin) <= 0.5 ng/mL: Systemic infection (sepsis) is not likely. Local bacterial infection is possible. (NOTE)       Sepsis PCT Algorithm           Lower Respiratory Tract                                      Infection PCT Algorithm    ----------------------------     ----------------------------         PCT < 0.25 ng/mL                PCT < 0.10 ng/mL          Strongly encourage             Strongly discourage   discontinuation of antibiotics    initiation of antibiotics    ----------------------------     -----------------------------       PCT 0.25 - 0.50 ng/mL            PCT 0.10 - 0.25 ng/mL               OR       >80% decrease in PCT            Discourage initiation of  antibiotics      Encourage discontinuation           of antibiotics    ----------------------------     -----------------------------         PCT >= 0.50 ng/mL              PCT 0.26 - 0.50 ng/mL               AND        <80% decrease in PCT             Encourage initiation of                                             antibiotics       Encourage continuation           of antibiotics    ----------------------------     -----------------------------        PCT >= 0.50 ng/mL                  PCT > 0.50 ng/mL               AND         increase in PCT                  Strongly encourage                                      initiation of antibiotics    Strongly encourage escalation           of  antibiotics                                     -----------------------------                                           PCT <= 0.25 ng/mL                                                 OR                                        > 80% decrease in PCT                                      Discontinue / Do not initiate                                             antibiotics  Performed at [facility], [address] [place], [place] [id number]   Lactate dehydrogenase     Status: Abnormal   Collection  Time: 02/13/20  5:50 PM  Result Value Ref Range   LDH 446 (H) 98 - 192 U/L    Comment: Performed at [facility], [address], [place], [place] [address]  Urinalysis, Routine w reflex microscopic Urine, Clean Catch     Status: None   Collection Time: 02/13/20  6:00 PM  Result Value Ref Range   Color, Urine YELLOW YELLOW   APPearance CLEAR CLEAR   Specific Gravity, Urine 1.029 1.005 - 1.030   pH 5.0 5.0 - 8.0   Glucose, UA NEGATIVE NEGATIVE mg/dL   Hgb urine dipstick NEGATIVE NEGATIVE   Bilirubin Urine NEGATIVE NEGATIVE   Ketones, ur NEGATIVE NEGATIVE mg/dL   Protein, ur NEGATIVE NEGATIVE mg/dL   Nitrite NEGATIVE NEGATIVE   Leukocytes,Ua NEGATIVE NEGATIVE    Comment: Performed at [facility], [address], [place], [place] [address]   DG Chest Port 1 View  Result Date: 02/13/2020 CLINICAL DATA:  COVID-19 positive on 02/07/2020, cough, no nausea EXAM: PORTABLE CHEST 1 VIEW COMPARISON:  Radiograph 11/09/2017 FINDINGS: Some hazy and patchy opacities are present in the mid to lower lungs and periphery of the left lung base likely with some atelectatic changes as well given low lung volumes. No pneumothorax or visible effusion. The cardiomediastinal contours are unremarkable. No acute osseous or soft tissue abnormality. Degenerative changes are present in the imaged spine and shoulders. Telemetry leads overlie the chest. IMPRESSION:  1. Features concerning for developing pneumonia, in the setting of COVID-19 positivity. 2. Additionally, the lung volumes are low with atelectatic changes as well. Electronically Signed   By: Kreg Shropshire M.D.   On: 02/13/2020 15:19    Pending Labs Unresulted Labs (From admission, onward)          Start     Ordered   02/20/20 0500  Creatinine, serum  (enoxaparin (LOVENOX)    CrCl >/= 30 ml/min)  Weekly,   R     Comments: while on enoxaparin therapy    02/13/20 1817   02/14/20 0500  HIV Antibody (routine testing w rflx)  (HIV Antibody (Routine testing w reflex) panel)  Tomorrow morning,   R        02/13/20 1817   02/14/20 0500  Comprehensive metabolic panel  Daily,   R      02/13/20 1817   02/14/20 0500  C-reactive protein  Daily,   R      02/13/20 1817   02/14/20 0500  D-dimer, quantitative (not at [facility])  Daily,   R      02/13/20 1817   02/14/20 0500  Ferritin  Daily,   R      02/13/20 1817   02/13/20 1710  Triglycerides  Add-on,   AD        02/13/20 1710   02/13/20 1453  Culture, blood (Routine x 2)  BLOOD CULTURE X 2,   STAT      02/13/20 1452          Vitals/Pain Today's Vitals   02/13/20 1830 02/13/20 1845 02/13/20 1900 02/13/20 1915  BP: 108/68 120/83 111/70 111/70  Pulse: 88 89 90 89  Resp: (!) 46 (!) 45 (!) 42 (!) 41  Temp:      TempSrc:      SpO2: 95% 97% 97% 96%  Weight:      Height:        Isolation Precautions Airborne and Contact precautions  Medications Medications  remdesivir 200 mg in sodium chloride 0.9% 250  mL IVPB (0 mg Intravenous Stopped 02/13/20 1748)    Followed by  remdesivir 100 mg in sodium chloride 0.9 % 100 mL IVPB (has no administration in time range)  enoxaparin (LOVENOX) injection 40 mg (has no administration in time range)  lactated ringers infusion (has no administration in time range)  dexamethasone (DECADRON) injection 6 mg (has no administration in time range)  guaiFENesin-dextromethorphan (ROBITUSSIN DM) 100-10 MG/5ML syrup 10  mL (has no administration in time range)  chlorpheniramine-HYDROcodone (TUSSIONEX) 10-8 MG/5ML suspension 5 mL (has no administration in time range)  acetaminophen (TYLENOL) tablet 650 mg (has no administration in time range)  ondansetron (ZOFRAN) tablet 4 mg (has no administration in time range)    Or  ondansetron (ZOFRAN) injection 4 mg (has no administration in time range)  dexamethasone (DECADRON) injection 10 mg (10 mg Intravenous Given 02/13/20 1610)    Mobility walks

## 2020-02-13 NOTE — Plan of Care (Signed)
  Knowledge of General Education information will improve (Education:)    Ability to manage health-related needs will improve (Health Behavior/Discharge Planning:)    Ability to maintain clinical measurements within normal limits will improve (Clinical Measurements:)    Will remain free from infection (Clinical Measurements:)    Diagnostic test results will improve (Clinical Measurements:)    Respiratory complications will improve (Clinical Measurements:)    Cardiovascular complication will be avoided (Clinical Measurements:)    Risk for activity intolerance will decrease (Activity:)    Adequate nutrition will be maintained (Nutrition:)    Will not experience complications related to urinary retention (Elimination:)    General experience of comfort will improve (Pain Managment:)    Ability to remain free from injury will improve (Safety:)    Risk for impaired skin integrity will decrease (Skin Integrity:)

## 2020-02-14 DIAGNOSIS — U071 COVID-19: Secondary | ICD-10-CM | POA: Diagnosis not present

## 2020-02-14 DIAGNOSIS — J1282 Pneumonia due to coronavirus disease 2019: Secondary | ICD-10-CM | POA: Diagnosis not present

## 2020-02-14 LAB — COMPREHENSIVE METABOLIC PANEL
ALT: 40 U/L (ref 0–44)
AST: 57 U/L — ABNORMAL HIGH (ref 15–41)
Albumin: 3 g/dL — ABNORMAL LOW (ref 3.5–5.0)
Alkaline Phosphatase: 55 U/L (ref 38–126)
Anion gap: 13 (ref 5–15)
BUN: 29 mg/dL — ABNORMAL HIGH (ref 8–23)
CO2: 22 mmol/L (ref 22–32)
Calcium: 8.4 mg/dL — ABNORMAL LOW (ref 8.9–10.3)
Chloride: 102 mmol/L (ref 98–111)
Creatinine, Ser: 0.88 mg/dL (ref 0.61–1.24)
GFR, Estimated: >60 mL/min (ref >60)
Glucose, Bld: 139 mg/dL — ABNORMAL HIGH (ref 70–99)
Potassium: 4.4 mmol/L (ref 3.5–5.1)
Sodium: 137 mmol/L (ref 135–145)
Total Bilirubin: 0.8 mg/dL (ref 0.3–1.2)
Total Protein: 7.1 g/dL (ref 6.5–8.1)

## 2020-02-14 LAB — FERRITIN: Ferritin: 1483 ng/mL — ABNORMAL HIGH (ref 24–336)

## 2020-02-14 LAB — CULTURE, BLOOD (ROUTINE X 2): Culture: NO GROWTH

## 2020-02-14 LAB — C-REACTIVE PROTEIN: CRP: 16.9 mg/dL — ABNORMAL HIGH (ref <1.0)

## 2020-02-14 LAB — D-DIMER, QUANTITATIVE: D-Dimer, Quant: 0.43 ug/mL-FEU (ref 0.00–0.50)

## 2020-02-14 LAB — HIV ANTIBODY (ROUTINE TESTING W REFLEX): HIV Screen 4th Generation wRfx: NONREACTIVE

## 2020-02-14 MED ORDER — PROSOURCE PLUS PO LIQD
30.0000 mL | Freq: Two times a day (BID) | ORAL | Status: DC
  Administered 2020-02-14 – 2020-02-25 (×20): 30 mL via ORAL
  Filled 2020-02-14 (×20): qty 30

## 2020-02-14 MED ORDER — BARICITINIB 2 MG PO TABS: 4.0000 mg | ORAL_TABLET | Freq: Every day | ORAL | Status: DC

## 2020-02-14 MED ORDER — FUROSEMIDE 10 MG/ML IJ SOLN
20.0000 mg | Freq: Once | INTRAMUSCULAR | Status: AC
  Administered 2020-02-14: 20 mg via INTRAVENOUS
  Filled 2020-02-14: qty 2

## 2020-02-14 MED ORDER — METHYLPREDNISOLONE SODIUM SUCC 125 MG IJ SOLR
55.0000 mg | Freq: Two times a day (BID) | INTRAMUSCULAR | Status: DC
  Administered 2020-02-14: 55 mg via INTRAVENOUS
  Filled 2020-02-14: qty 2

## 2020-02-14 MED ORDER — TOCILIZUMAB 400 MG/20ML IV SOLN
800.0000 mg | Freq: Once | INTRAVENOUS | Status: AC
  Administered 2020-02-14: 800 mg via INTRAVENOUS
  Filled 2020-02-14: qty 40

## 2020-02-14 MED ORDER — METHYLPREDNISOLONE SODIUM SUCC 125 MG IJ SOLR
1.0000 mg/kg | Freq: Two times a day (BID) | INTRAMUSCULAR | Status: DC
  Administered 2020-02-14 – 2020-02-22 (×16): 109.375 mg via INTRAVENOUS
  Filled 2020-02-14 (×16): qty 2

## 2020-02-14 NOTE — Plan of Care (Signed)
  Problem: Education: Goal: Knowledge of General Education information will improve Description: Including pain rating scale, medication(s)/side effects and non-pharmacologic comfort measures Outcome: Progressing   Problem: Health Behavior/Discharge Planning: Goal: Ability to manage health-related needs will improve Outcome: Progressing   Problem: Clinical Measurements: Goal: Ability to maintain clinical measurements within normal limits will improve Outcome: Progressing Goal: Will remain free from infection Outcome: Progressing Goal: Diagnostic test results will improve Outcome: Progressing Goal: Respiratory complications will improve Outcome: Progressing Goal: Cardiovascular complication will be avoided Outcome: Progressing   Problem: Activity: Goal: Risk for activity intolerance will decrease Outcome: Progressing   Problem: Nutrition: Goal: Adequate nutrition will be maintained Outcome: Progressing   Problem: Elimination: Goal: Will not experience complications related to urinary retention Outcome: Progressing   Problem: Pain Managment: Goal: General experience of comfort will improve Outcome: Progressing   Problem: Safety: Goal: Ability to remain free from injury will improve Outcome: Progressing   Problem: Skin Integrity: Goal: Risk for impaired skin integrity will decrease Outcome: Progressing   

## 2020-02-14 NOTE — Progress Notes (Signed)
Initial Nutrition Assessment  DOCUMENTATION CODES:   Obesity unspecified  INTERVENTION:  - continue Ensure Enlive po BID, each supplement provides 350 kcal and 20 grams of protein - will Will order 30 ml Prosource Plus BID, each supplement provides 100 kcal and 15 grams protein.  - recommend liberalizing diet from Heart Healthy to Regular.   NUTRITION DIAGNOSIS:   Increased nutrient needs related to acute illness, catabolic illness (COVID-19 infection) as evidenced by estimated needs.  GOAL:   Patient will meet greater than or equal to 90% of their needs  MONITOR:   PO intake, Supplement acceptance, Labs, Weight trends  REASON FOR ASSESSMENT:   Malnutrition Screening Tool  ASSESSMENT:   59 y.o. male with medical history of HTN, obesity, hypercholesterolemia, and DJD. He tested positive for COVID-19 on 02/07/20. He presented to the ED from home via EMS due to fatigue, body aches, malaise, SOB, mild cough, and fever. He was also having intermittent mild nausea and a few episodes of non-bloody loose stools.  He ate 25% of breakfast this AM. Patient reports decreased appetite/desire to eat since onset of symptoms ~2 weeks ago.   He feels he has lost weight during this time frame but is unsure of how much he may have lost. Weight yesterday was documented as both 241 lb and 243 lb. PTA, the most recently documented weight was on 07/26/19 at [facility] when he weighed 262 lb. This indicates 21 lb weight loss (8% body weight).   Ensure Enlive was ordered BID starting this AM and patient did accept bottle offered earlier this AM; unsure of how much has been/will be consumed.   Per notes: - well controlled BP at the time of admission with HCTZ and losartan being held  - acute respiratory failure with hypoxia d/t COVID-19 PNA    Labs reviewed; BUN: 29 mg/dl, Ca: 8.4 mg/dl. Medications reviewed; 55 mg solu-medrol BID, 200 mg IV remdesivir x1 dose 10/11, 100 mg IV remdesivir x1 dose/day x4  days (10/12-10/15).   Diet Order:   Diet Order            Diet Heart Room service appropriate? Yes; Fluid consistency: Thin  Diet effective now                 EDUCATION NEEDS:   No education needs have been identified at this time  Skin:  Skin Assessment: Reviewed RN Assessment  Last BM:  PTA/unknown  Height:   Ht Readings from Last 1 Encounters:  02/13/20 6' (1.829 m)    Weight:   Wt Readings from Last 1 Encounters:  02/13/20 109.3 kg    Estimated Nutritional Needs:  Kcal:  2100-2300 kcal Protein:  110-120 grams Fluid:  >/= 2.2 L/day     Trenton Gammon, MS, RD, LDN, CNSC Inpatient Clinical Dietitian RD pager # available in [facility]  After hours/weekend pager # available in [facility]

## 2020-02-14 NOTE — Progress Notes (Signed)
[facility] Progress Note  Patient: Darren Wood    TGY:[id number]  DOA: 02/13/2020     Date of Service: the patient was seen and examined on 02/14/2020  Brief hospital course: Past medical history of HTN, morbid obesity, hypercholesterolemia, DJD.  Presents with complaints of cough and shortness of breath.  Tested positive for COVID-19 on 02/07/2020.  Not vaccinated for COVID-19. Currently plan is supportive care for hypoxia and treatment for COVID-19.  Assessment and Plan: 1.  Acute hypoxic respiratory failure.  POA. Acute COVID-19 Viral Pneumonia CXR: hazy bilateral peripheral opacities Oxygen requirement: 72% on room air on admission.  On 15 L NRB currently, unable to tolerate high flow nasal cannula.  May require heated high flow nasal cannula if oxygenation worsens. Patient remains at very high risk for decompensation given his severe hypoxia dropping to 85 to 82% even on nonrebreather CRP: 16.5-16.9 Remdesivir: Started on 02/13/2020 Steroids: Started on IV Decadron on admission currently on IV Solu-Medrol 1 mg/kg twice daily. Baricitinib/Actemra(off-label use): Ordered 1 dose of Actemra on 02/14/2020. The investigational nature of this medication was discussed with the patient/HCPOA and they choose to proceed as the potential benefits are felt to outweigh risks at this time.  Antibiotics: Currently not indicated Vitamin C and Zinc: Continue DVT Prophylaxis: enoxaparin (LOVENOX) injection 40 mg Start: 02/13/20 1830  1 dose of IV Lasix.  Prone positioning and incentive spirometer use recommended.  Patient is a side sleeper recommended to at least spend time on the side.  Overall plan: High risk for decompensation.  May require transfer to stepdown unit.  Continue progressive care.  The treatment plan and use of medications and known side effects were discussed with patient/family. It was clearly explained that Complete risks and long-term side effects are unknown, however in  the best clinical judgment they seem to be of some clinical benefit rather than medical risks. Patient/family agree with the treatment plan and want to receive these treatments as indicated.   2.  Essential hypertension Blood pressure currently stable without medication. We will be holding HCTZ as well as losartan.  3.  Obesity New sleep apnea assessment Body mass index is 32.68 kg/m.  Per patient wife reports he snores heavily. Per patient he breathes through his mouth while he sleeping.  But denies having any daytime sleepiness or waking up with cardiac situation. For now outpatient sleep study once medically ready.  4.  Nausea, poor p.o. intake Currently resolved. Monitor. Discontinue IV fluids.  Rather we will give him IV Lasix. Advance diet.  5.  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 Continue to monitor.  Pain control.  Body mass index is 32.68 kg/m.  Nutrition Problem: Increased nutrient needs Etiology: acute illness, catabolic illness (COVID-19 infection) Interventions: Interventions: Ensure Enlive (each supplement provides 350kcal and 20 grams of protein), Other (Comment) (Prosource Plus)      Diet: Regular diet low-sodium DVT Prophylaxis:   enoxaparin (LOVENOX) injection 40 mg Start: 02/13/20 1830    Advance goals of care discussion: Full code  Family Communication: no family was present at bedside, at the time of interview.  The pt provided permission to discuss medical plan with the family. Opportunity was given to ask question and all questions were answered satisfactorily.   Disposition:  Status is: Inpatient  Remains inpatient appropriate because:Hemodynamically unstable  Dispo: The patient is from: Home              Anticipated d/c is to: Home  Anticipated d/c date is: > 3 days              Patient currently is not medically stable to d/c.   Subjective: Severe shortness of breath reported.  No nausea no vomiting.  Minimal exertion  causes hypoxia.  Currently taking deep breath causes cough.  No fever no chills.  No chest pain.  Physical Exam:  General: Appear in marked distress, no Rash; Oral Mucosa Clear, moist. no Abnormal Neck Mass Or lumps, Conjunctiva normal  Cardiovascular: S1 and S2 Present, no Murmur, Respiratory: increased respiratory effort, Bilateral Air entry present and bilateral  Crackles, no wheezes Abdomen: Bowel Sound present, Soft and no tenderness Extremities: trace Pedal edema Neurology: alert and oriented to time, place, and person affect appropriate. no new focal deficit Gait not checked due to patient safety concerns  Vitals:   02/14/20 0452 02/14/20 0938 02/14/20 1107 02/14/20 1401  BP:   (!) 131/97 (!) 141/84  Pulse:   86 88  Resp:   20 20  Temp:   97.9 F (36.6 C) 97.6 F (36.4 C)  TempSrc:   Oral Oral  SpO2: 90% (!) 85% 92% (!) 82%  Weight:      Height:        Intake/Output Summary (Last 24 hours) at 02/14/2020 1552 Last data filed at 02/14/2020 1030 Gross per 24 hour  Intake 240 ml  Output --  Net 240 ml   Filed Weights   02/13/20 1622 02/13/20 2017  Weight: 110.2 kg 109.3 kg    Data Reviewed: I have personally reviewed and interpreted daily labs, tele strips, imagings as discussed above. I reviewed all nursing notes, pharmacy notes, vitals, pertinent old records I have discussed plan of care as described above with RN and patient/family.  CBC: Recent Labs  Lab 02/13/20 1453  WBC 9.1  NEUTROABS 6.6  HGB 14.2  HCT 43.3  MCV 87.7  PLT 256   Basic Metabolic Panel: Recent Labs  Lab 02/13/20 1453 02/14/20 0521  NA 137 137  K 3.9 4.4  CL 100 102  CO2 24 22  GLUCOSE 117* 139*  BUN 32* 29*  CREATININE 1.09 0.88  CALCIUM 8.2* 8.4*    Studies: No results found.  Scheduled Meds:  (feeding supplement) PROSource Plus  30 mL Oral BID BM   baricitinib  4 mg Oral Daily   enoxaparin (LOVENOX) injection  40 mg Subcutaneous Q24H   feeding supplement  (ENSURE ENLIVE)  237 mL Oral BID BM   furosemide  20 mg Intravenous Once   methylPREDNISolone (SOLU-MEDROL) injection  1 mg/kg Intravenous Q12H   Continuous Infusions:  remdesivir 100 mg in NS 100 mL 100 mg (02/14/20 1026)   PRN Meds: acetaminophen, chlorpheniramine-HYDROcodone, guaiFENesin-dextromethorphan, ondansetron **OR** ondansetron (ZOFRAN) IV  Time spent: 35 minutes  Author: Lynden Oxford, MD [facility] 02/14/2020 3:52 PM  To reach On-call, see care teams to locate the attending and reach out via [email or web address]. Between 7PM-7AM, please contact night-coverage If you still have difficulty reaching the attending provider, please page the [facility] (Director on Call) for [facility] on amion for assistance.

## 2020-02-14 NOTE — TOC Progression Note (Signed)
Transition of Care [facility]) - Progression Note    Patient Details  Name: Darren Wood MRN: [id number] Date of Birth: 01/28/1961  Transition of Care [facility]) CM/SW Contact  Geni Bers, RN Phone Number: 02/14/2020, 3:38 PM  Clinical Narrative:     Pt from home [facility] will continue to follow for discharge needs.   Expected Discharge Plan: Home/Self Care Barriers to Discharge: No Barriers Identified  Expected Discharge Plan and Services Expected Discharge Plan: Home/Self Care                                               Social Determinants of Health (SDOH) Interventions    Readmission Risk Interventions No flowsheet data found.

## 2020-02-15 DIAGNOSIS — Z96642 Presence of left artificial hip joint: Secondary | ICD-10-CM | POA: Diagnosis not present

## 2020-02-15 DIAGNOSIS — I10 Essential (primary) hypertension: Secondary | ICD-10-CM

## 2020-02-15 DIAGNOSIS — E669 Obesity, unspecified: Secondary | ICD-10-CM

## 2020-02-15 DIAGNOSIS — J9601 Acute respiratory failure with hypoxia: Secondary | ICD-10-CM

## 2020-02-15 DIAGNOSIS — U071 COVID-19: Secondary | ICD-10-CM | POA: Diagnosis not present

## 2020-02-15 LAB — CULTURE, BLOOD (ROUTINE X 2): Special Requests: ADEQUATE

## 2020-02-15 LAB — D-DIMER, QUANTITATIVE: D-Dimer, Quant: 0.29 ug/mL-FEU (ref 0.00–0.50)

## 2020-02-15 LAB — COMPREHENSIVE METABOLIC PANEL
ALT: 44 U/L (ref 0–44)
AST: 52 U/L — ABNORMAL HIGH (ref 15–41)
Albumin: 3.1 g/dL — ABNORMAL LOW (ref 3.5–5.0)
Alkaline Phosphatase: 53 U/L (ref 38–126)
Anion gap: 11 (ref 5–15)
BUN: 42 mg/dL — ABNORMAL HIGH (ref 8–23)
CO2: 22 mmol/L (ref 22–32)
Calcium: 8.4 mg/dL — ABNORMAL LOW (ref 8.9–10.3)
Chloride: 103 mmol/L (ref 98–111)
Creatinine, Ser: 0.99 mg/dL (ref 0.61–1.24)
GFR, Estimated: >60 mL/min (ref >60)
Glucose, Bld: 154 mg/dL — ABNORMAL HIGH (ref 70–99)
Potassium: 4.5 mmol/L (ref 3.5–5.1)
Sodium: 136 mmol/L (ref 135–145)
Total Bilirubin: 0.5 mg/dL (ref 0.3–1.2)
Total Protein: 6.9 g/dL (ref 6.5–8.1)

## 2020-02-15 LAB — FERRITIN: Ferritin: 1383 ng/mL — ABNORMAL HIGH (ref 24–336)

## 2020-02-15 LAB — C-REACTIVE PROTEIN: CRP: 7.5 mg/dL — ABNORMAL HIGH (ref <1.0)

## 2020-02-15 NOTE — Plan of Care (Signed)
  Problem: Education: Goal: Knowledge of General Education information will improve Description: Including pain rating scale, medication(s)/side effects and non-pharmacologic comfort measures Outcome: Progressing   Problem: Health Behavior/Discharge Planning: Goal: Ability to manage health-related needs will improve Outcome: Progressing   Problem: Clinical Measurements: Goal: Ability to maintain clinical measurements within normal limits will improve Outcome: Progressing Goal: Will remain free from infection Outcome: Progressing Goal: Diagnostic test results will improve Outcome: Progressing Goal: Respiratory complications will improve Outcome: Progressing Goal: Cardiovascular complication will be avoided Outcome: Progressing   Problem: Activity: Goal: Risk for activity intolerance will decrease Outcome: Progressing   Problem: Nutrition: Goal: Adequate nutrition will be maintained Outcome: Progressing   Problem: Elimination: Goal: Will not experience complications related to urinary retention Outcome: Progressing   Problem: Pain Managment: Goal: General experience of comfort will improve Outcome: Progressing   Problem: Safety: Goal: Ability to remain free from injury will improve Outcome: Progressing   Problem: Skin Integrity: Goal: Risk for impaired skin integrity will decrease Outcome: Progressing   

## 2020-02-15 NOTE — Progress Notes (Signed)
RN at bedside with Dr. Elvera Lennox. RN changed pt's pulse ox probe to his ear from his finger. Pt O2 sats 100% with 15L NRB. RN changed pt to HFNC 9L and pt sats remained 95-96%. Pt instructed to call RN with needs or shortness of breath. Pt encouraged to use incentive spirometer as much as possible today. Pt verbalized understanding.

## 2020-02-15 NOTE — Progress Notes (Signed)
PROGRESS NOTE  Darren Wood WPY:[id number] DOB: [birth year] DOA: 02/13/2020 PCP: Darren Jacobson, MD   LOS: 2 days   Brief Narrative / Interim history: 59 year old male with history of obesity, hypertension, hyperlipidemia, came into the hospital with cough, shortness of breath.  He has been having symptoms for the past 10 days, tested positive for Covid on 10/5, but over the last 3 to 4 days prior to admission he has experienced worsening shortness of breath.  He was hypoxic to 72% on room air on admission requiring 15 L nonrebreather.  Subjective / 24h Interval events: Sitting in chair, overall feels a little bit better and appreciates that his breathing is better.  No chest pain, no abdominal pain, no nausea or vomiting.  Assessment & Plan:  Principal Problem Acute Hypoxic Respiratory Failure due to Covid-19 Viral Illness -Continue supportive treatment, on 15 L nonrebreather this morning but when I was in the room able to be weaned off to 8 L -Started on remdesivir, Decadron, continue -Status post Actemra on 10/12 -Wean off oxygen as tolerated, prone if able, incentive spirometry, flutter valve  -Low threshold to move to stepdown, high risk for decompensation   COVID-19 Labs  Recent Labs    02/13/20 1709 02/13/20 1750 02/14/20 0521 02/15/20 0610  DDIMER 0.28  --  0.43 0.29  FERRITIN 1,485*  --  1,483* 1,383*  LDH  --  446*  --   --   CRP 16.5*  --  16.9* 7.5*    Lab Results  Component Value Date   SARSCOV2NAA POSITIVE (A) 02/13/2020    Active Problems Essential hypertension -Currently normotensive, his home medications are on hold, resume as indicated  Obesity -BMI 32, patient would need sleep study as an outpatient, wife reports he snores heavily    Scheduled Meds: . (feeding supplement) PROSource Plus  30 mL Oral BID BM  . enoxaparin (LOVENOX) injection  40 mg Subcutaneous Q24H  . feeding supplement (ENSURE ENLIVE)  237 mL Oral BID BM  . methylPREDNISolone  (SOLU-MEDROL) injection  1 mg/kg Intravenous Q12H   Continuous Infusions: . remdesivir 100 mg in NS 100 mL 100 mg (02/15/20 1004)   PRN Meds:.acetaminophen, chlorpheniramine-HYDROcodone, guaiFENesin-dextromethorphan, ondansetron **OR** ondansetron (ZOFRAN) IV  DVT prophylaxis: Lovenox Code Status: Full code Family Communication: Darren Wood, wife, [phone]  Status is: Inpatient  Remains inpatient appropriate because:Inpatient level of care appropriate due to severity of illness   Dispo: The patient is from: Home              Anticipated d/c is to: Home              Anticipated d/c date is: 2 days              Patient currently is not medically stable to d/c.  Consultants:  None   Procedures:  None   Microbiology: none  Antibacterials: None    Objective: Vitals:   02/15/20 0541 02/15/20 0750 02/15/20 0900 02/15/20 0934  BP: 135/86 122/82  (!) 141/81  Pulse: 77 82  86  Resp: (!) 24 (!) 27  (!) 29  Temp: 97.8 F (36.6 C) 97.8 F (36.6 C)  97.7 F (36.5 C)  TempSrc:  Oral  Oral  SpO2: 97% 90% 96% 93%  Weight:      Height:        Intake/Output Summary (Last 24 hours) at 02/15/2020 1140 Last data filed at 02/15/2020 1000 Gross per 24 hour  Intake 440 ml  Output 850 ml  Net -  410 ml   Filed Weights   February 28, 2020 1622 02-28-2020 2017  Weight: 110.2 kg 109.3 kg    Examination:  Constitutional: Sitting in the chair, no apparent distress Eyes: no scleral icterus ENMT: Mucous membranes are moist.  Neck: normal, supple Respiratory: Diffuse bibasilar rhonchi, no wheezing, increased respiratory effort, tachypneic Cardiovascular: Regular rate and rhythm, no murmurs / rubs / gallops. No LE edema. Abdomen: non distended, no tenderness. Bowel sounds positive.  Musculoskeletal: no clubbing / cyanosis.  Skin: no rashes Neurologic: Nonfocal, equal strength  Data Reviewed: I have independently reviewed following labs and imaging studies   CBC: Recent Labs  Lab  2020/02/28 1453  WBC 9.1  NEUTROABS 6.6  HGB 14.2  HCT 43.3  MCV 87.7  PLT 256   Basic Metabolic Panel: Recent Labs  Lab Feb 28, 2020 1453 02/14/20 0521 02/15/20 0610  NA 137 137 136  K 3.9 4.4 4.5  CL 100 102 103  CO2 24 22 22   GLUCOSE 117* 139* 154*  BUN 32* 29* 42*  CREATININE 1.09 0.88 0.99  CALCIUM 8.2* 8.4* 8.4*   GFR: Estimated Creatinine Clearance: 100.1 mL/min (by C-G formula based on SCr of 0.99 mg/dL). Liver Function Tests: Recent Labs  Lab 2020-02-28 1453 02/14/20 0521 02/15/20 0610  AST 59* 57* 52*  ALT 35 40 44  ALKPHOS 59 55 53  BILITOT 0.8 0.8 0.5  PROT 7.4 7.1 6.9  ALBUMIN 3.4* 3.0* 3.1*   No results for input(s): LIPASE, AMYLASE in the last 168 hours. No results for input(s): AMMONIA in the last 168 hours. Coagulation Profile: Recent Labs  Lab February 28, 2020 1453  INR 1.1   Cardiac Enzymes: No results for input(s): CKTOTAL, CKMB, CKMBINDEX, TROPONINI in the last 168 hours. BNP (last 3 results) No results for input(s): PROBNP in the last 8760 hours. HbA1C: No results for input(s): HGBA1C in the last 72 hours. CBG: No results for input(s): GLUCAP in the last 168 hours. Lipid Profile: Recent Labs    2020-02-28 1710  TRIG 151*   Thyroid Function Tests: No results for input(s): TSH, T4TOTAL, FREET4, T3FREE, THYROIDAB in the last 72 hours. Anemia Panel: Recent Labs    02/14/20 0521 02/15/20 0610  FERRITIN 1,483* 1,383*   Urine analysis:    Component Value Date/Time   COLORURINE YELLOW 02-28-2020 1800   APPEARANCEUR CLEAR Feb 28, 2020 1800   LABSPEC 1.029 February 28, 2020 1800   PHURINE 5.0 28-Feb-2020 1800   GLUCOSEU NEGATIVE 2020/02/28 1800   HGBUR NEGATIVE 02-28-2020 1800   BILIRUBINUR NEGATIVE 2020-02-28 1800   KETONESUR NEGATIVE 28-Feb-2020 1800   PROTEINUR NEGATIVE 2020/02/28 1800   NITRITE NEGATIVE Feb 28, 2020 1800   LEUKOCYTESUR NEGATIVE Feb 28, 2020 1800   Sepsis Labs: Invalid input(s): PROCALCITONIN, LACTICIDVEN  Recent Results (from the  past 240 hour(s))  Culture, blood (Routine x 2)     Status: None (Preliminary result)   Collection Time: 02-28-20  2:53 PM   Specimen: BLOOD  Result Value Ref Range Status   Specimen Description   Final    BLOOD LEFT ANTECUBITAL Performed at [facility], [address] [place], [place]    Special Requests   Final    BOTTLES DRAWN AEROBIC AND ANAEROBIC Blood Culture adequate volume Performed at [facility], [address], [place], [place]    Culture   Final    NO GROWTH 2 DAYS Performed at [facility], [address] [place], [place]    Report Status PENDING  Incomplete  Respiratory Panel  by RT PCR (Flu A&B, Covid) - Nasopharyngeal Swab     Status: Abnormal   Collection Time: 02/13/20  4:16 PM   Specimen: Nasopharyngeal Swab  Result Value Ref Range Status   SARS Coronavirus 2 by RT PCR POSITIVE (A) NEGATIVE Final    Comment: RESULT CALLED TO, READ BACK BY AND VERIFIED WITH: ZULETTA,K RN @1731  ON 02/13/20 JACKSON,K (NOTE) SARS-CoV-2 target nucleic acids are DETECTED.  SARS-CoV-2 RNA is generally detectable in upper respiratory specimens  during the acute phase of infection. Positive results are indicative of the presence of the identified virus, but do not rule out bacterial infection or co-infection with other pathogens not detected by the test. Clinical correlation with patient history and other diagnostic information is necessary to determine patient infection status. The expected result is Negative.  Fact Sheet for Patients:  04/14/20  Fact Sheet for Healthcare Providers: https://www.moore.com/  This test is not yet approved or cleared by the https://www.young.biz/ FDA and  has been authorized for detection and/or diagnosis of SARS-CoV-2 by FDA under an Emergency Use Authorization (EUA).  This EUA will remain in effect (meaning this test c an be  used) for the duration of  the COVID-19 declaration under Section 564(b)(1) of the Act, 21 U.S.C. section 360bbb-3(b)(1), unless the authorization is terminated or revoked sooner.      Influenza A by PCR NEGATIVE NEGATIVE Final   Influenza B by PCR NEGATIVE NEGATIVE Final    Comment: (NOTE) The Xpert Xpress SARS-CoV-2/FLU/RSV assay is intended as an aid in  the diagnosis of influenza from Nasopharyngeal swab specimens and  should not be used as a sole basis for treatment. Nasal washings and  aspirates are unacceptable for Xpert Xpress SARS-CoV-2/FLU/RSV  testing.  Fact Sheet for Patients: [place]  Fact Sheet for Healthcare Providers: https://www.moore.com/  This test is not yet approved or cleared by the https://www.young.biz/ FDA and  has been authorized for detection and/or diagnosis of SARS-CoV-2 by  FDA under an Emergency Use Authorization (EUA). This EUA will remain  in effect (meaning this test can be used) for the duration of the  Covid-19 declaration under Section 564(b)(1) of the Act, 21  U.S.C. section 360bbb-3(b)(1), unless the authorization is  terminated or revoked. Performed at [facility], [address], [place], [place]   Culture, blood (Routine x 2)     Status: None (Preliminary result)   Collection Time: 02/13/20  4:30 PM   Specimen: BLOOD  Result Value Ref Range Status   Specimen Description   Final    BLOOD RIGHT ARM Performed at [facility], [address], [place], [place]    Special Requests   Final    BOTTLES DRAWN AEROBIC AND ANAEROBIC Blood Culture results may not be optimal due to an inadequate volume of blood received in culture bottles Performed at [facility], [address] [place], [place]    Culture   Final    NO GROWTH 2 DAYS Performed at [facility], [address] [place], [place]      Report Status PENDING  Incomplete      Radiology Studies: DG Chest Port 1 View  Result Date: 02/13/2020 CLINICAL DATA:  COVID-19 positive on 02/07/2020, cough, no nausea EXAM: PORTABLE CHEST 1 VIEW COMPARISON:  Radiograph 11/09/2017 FINDINGS: Some hazy and patchy opacities are present in the mid to lower lungs and periphery of the left lung base likely with some atelectatic  changes as well given low lung volumes. No pneumothorax or visible effusion. The cardiomediastinal contours are unremarkable. No acute osseous or soft tissue abnormality. Degenerative changes are present in the imaged spine and shoulders. Telemetry leads overlie the chest. IMPRESSION: 1. Features concerning for developing pneumonia, in the setting of COVID-19 positivity. 2. Additionally, the lung volumes are low with atelectatic changes as well. Electronically Signed   By: Kreg ShropshirePrice  DeHay M.D.   On: 02/13/2020 15:19    Pamella Pertostin Margareth Kanner, MD, PhD [facility]  Between 7 am - 7 pm I am available, please contact me via Amion or Securechat  Between 7 pm - 7 am I am not available, please contact night coverage MD/APP via Amion

## 2020-02-16 DIAGNOSIS — Z96642 Presence of left artificial hip joint: Secondary | ICD-10-CM | POA: Diagnosis not present

## 2020-02-16 DIAGNOSIS — J9601 Acute respiratory failure with hypoxia: Secondary | ICD-10-CM | POA: Diagnosis not present

## 2020-02-16 DIAGNOSIS — U071 COVID-19: Secondary | ICD-10-CM | POA: Diagnosis not present

## 2020-02-16 DIAGNOSIS — I10 Essential (primary) hypertension: Secondary | ICD-10-CM | POA: Diagnosis not present

## 2020-02-16 LAB — D-DIMER, QUANTITATIVE: D-Dimer, Quant: 0.3 ug/mL-FEU (ref 0.00–0.50)

## 2020-02-16 LAB — COMPREHENSIVE METABOLIC PANEL
ALT: 60 U/L — ABNORMAL HIGH (ref 0–44)
AST: 54 U/L — ABNORMAL HIGH (ref 15–41)
Albumin: 3.2 g/dL — ABNORMAL LOW (ref 3.5–5.0)
Alkaline Phosphatase: 49 U/L (ref 38–126)
Anion gap: 14 (ref 5–15)
BUN: 41 mg/dL — ABNORMAL HIGH (ref 8–23)
CO2: 22 mmol/L (ref 22–32)
Calcium: 8.8 mg/dL — ABNORMAL LOW (ref 8.9–10.3)
Chloride: 104 mmol/L (ref 98–111)
Creatinine, Ser: 0.9 mg/dL (ref 0.61–1.24)
GFR, Estimated: >60 mL/min (ref >60)
Glucose, Bld: 158 mg/dL — ABNORMAL HIGH (ref 70–99)
Potassium: 4.4 mmol/L (ref 3.5–5.1)
Sodium: 140 mmol/L (ref 135–145)
Total Bilirubin: 0.8 mg/dL (ref 0.3–1.2)
Total Protein: 7.1 g/dL (ref 6.5–8.1)

## 2020-02-16 LAB — C-REACTIVE PROTEIN: CRP: 3.3 mg/dL — ABNORMAL HIGH (ref <1.0)

## 2020-02-16 LAB — FERRITIN: Ferritin: 1300 ng/mL — ABNORMAL HIGH (ref 24–336)

## 2020-02-16 NOTE — Progress Notes (Signed)
PROGRESS NOTE  Darren Wood WUJ:[id number] DOB: 10-03-1960 DOA: 02/13/2020 PCP: Loyal Jacobson, MD   LOS: 3 days   Brief Narrative / Interim history: 59 year old male with history of obesity, hypertension, hyperlipidemia, came into the hospital with cough, shortness of breath.  He has been having symptoms for the past 10 days, tested positive for Covid on 10/5, but over the last 3 to 4 days prior to admission he has experienced worsening shortness of breath.  He was hypoxic to 72% on room air on admission requiring 15 L nonrebreather.  Subjective / 24h Interval events: Sitting in chair, overall feels a little bit better and appreciates that his breathing is better.  No chest pain, no abdominal pain, no nausea or vomiting.  Assessment & Plan:  Principal Problem Acute Hypoxic Respiratory Failure due to Covid-19 Viral Illness -Continue supportive treatment, on 15 L and one-point with able to be weaned off to 6 L this morning. -Started on remdesivir, Decadron, continue, last dose of remdesivir will be 10/15 -Status post Actemra on 10/12 -Wean off oxygen as tolerated, prone if able, incentive spirometry, flutter valve  -Remains stable, transfer to MedSurg   COVID-19 Labs  Recent Labs    02/13/20 1709 02/13/20 1750 02/14/20 0521 02/15/20 0610 02/16/20 0456  DDIMER   < >  --  0.43 0.29 0.30  FERRITIN   < >  --  1,483* 1,383* 1,300*  LDH  --  446*  --   --   --   CRP   < >  --  16.9* 7.5* 3.3*   < > = values in this interval not displayed.    Lab Results  Component Value Date   SARSCOV2NAA POSITIVE (A) 02/13/2020    Active Problems Essential hypertension -Currently normotensive, his home medications are on hold, resume as indicated  Obesity -BMI 32, patient would need sleep study as an outpatient, wife reports he snores heavily   Scheduled Meds: . (feeding supplement) PROSource Plus  30 mL Oral BID BM  . enoxaparin (LOVENOX) injection  40 mg Subcutaneous Q24H  . feeding  supplement  237 mL Oral BID BM  . methylPREDNISolone (SOLU-MEDROL) injection  1 mg/kg Intravenous Q12H   Continuous Infusions: . remdesivir 100 mg in NS 100 mL 100 mg (02/16/20 0815)   PRN Meds:.acetaminophen, chlorpheniramine-HYDROcodone, guaiFENesin-dextromethorphan, ondansetron **OR** ondansetron (ZOFRAN) IV  DVT prophylaxis: Lovenox Code Status: Full code Family Communication: Deveron Shamoon, wife, [phone]  Status is: Inpatient  Remains inpatient appropriate because:Inpatient level of care appropriate due to severity of illness   Dispo: The patient is from: Home              Anticipated d/c is to: Home              Anticipated d/c date is: 2 days              Patient currently is not medically stable to d/c.  Consultants:  None   Procedures:  None   Microbiology: none  Antibacterials: None    Objective: Vitals:   02/15/20 2151 02/16/20 0200 02/16/20 0525 02/16/20 0748  BP: 140/90 (!) 145/77 124/83 139/84  Pulse: 93 89 85 91  Resp: (!) 28 (!) 21 (!) 21 18  Temp: 98.1 F (36.7 C) 98.3 F (36.8 C) 98.5 F (36.9 C) 98.2 F (36.8 C)  TempSrc: Oral Oral Oral Oral  SpO2: 94% 98% 91%   Weight:      Height:        Intake/Output Summary (Last  24 hours) at 02/16/2020 1057 Last data filed at 02/16/2020 0530 Gross per 24 hour  Intake 960 ml  Output 1175 ml  Net -215 ml   Filed Weights   02/13/20 1622 02/13/20 2017  Weight: 110.2 kg 109.3 kg    Examination:  Constitutional: In bed, no apparent distress Eyes: No scleral icterus ENMT: Moist mucous membranes Neck: normal, supple Respiratory: Faint rhonchi at the bases, no wheezing, increased respiratory effort, tachypneic Cardiovascular: Regular rate and rhythm, no murmurs, no peripheral edema Abdomen: Soft, nontender, nondistended, bowel sounds positive Musculoskeletal: no clubbing / cyanosis.  Skin: No rashes seen Neurologic: No focal deficits  Data Reviewed: I have independently reviewed following  labs and imaging studies   CBC: Recent Labs  Lab 02/13/20 1453  WBC 9.1  NEUTROABS 6.6  HGB 14.2  HCT 43.3  MCV 87.7  PLT 256   Basic Metabolic Panel: Recent Labs  Lab 02/13/20 1453 02/14/20 0521 02/15/20 0610 02/16/20 0456  NA 137 137 136 140  K 3.9 4.4 4.5 4.4  CL 100 102 103 104  CO2 24 22 22 22   GLUCOSE 117* 139* 154* 158*  BUN 32* 29* 42* 41*  CREATININE 1.09 0.88 0.99 0.90  CALCIUM 8.2* 8.4* 8.4* 8.8*   GFR: Estimated Creatinine Clearance: 110.1 mL/min (by C-G formula based on SCr of 0.9 mg/dL). Liver Function Tests: Recent Labs  Lab 02/13/20 1453 02/14/20 0521 02/15/20 0610 02/16/20 0456  AST 59* 57* 52* 54*  ALT 35 40 44 60*  ALKPHOS 59 55 53 49  BILITOT 0.8 0.8 0.5 0.8  PROT 7.4 7.1 6.9 7.1  ALBUMIN 3.4* 3.0* 3.1* 3.2*   No results for input(s): LIPASE, AMYLASE in the last 168 hours. No results for input(s): AMMONIA in the last 168 hours. Coagulation Profile: Recent Labs  Lab 02/13/20 1453  INR 1.1   Cardiac Enzymes: No results for input(s): CKTOTAL, CKMB, CKMBINDEX, TROPONINI in the last 168 hours. BNP (last 3 results) No results for input(s): PROBNP in the last 8760 hours. HbA1C: No results for input(s): HGBA1C in the last 72 hours. CBG: No results for input(s): GLUCAP in the last 168 hours. Lipid Profile: Recent Labs    02/13/20 1710  TRIG 151*   Thyroid Function Tests: No results for input(s): TSH, T4TOTAL, FREET4, T3FREE, THYROIDAB in the last 72 hours. Anemia Panel: Recent Labs    02/15/20 0610 02/16/20 0456  FERRITIN 1,383* 1,300*   Urine analysis:    Component Value Date/Time   COLORURINE YELLOW 02/13/2020 1800   APPEARANCEUR CLEAR 02/13/2020 1800   LABSPEC 1.029 02/13/2020 1800   PHURINE 5.0 02/13/2020 1800   GLUCOSEU NEGATIVE 02/13/2020 1800   HGBUR NEGATIVE 02/13/2020 1800   BILIRUBINUR NEGATIVE 02/13/2020 1800   KETONESUR NEGATIVE 02/13/2020 1800   PROTEINUR NEGATIVE 02/13/2020 1800   NITRITE NEGATIVE  02/13/2020 1800   LEUKOCYTESUR NEGATIVE 02/13/2020 1800   Sepsis Labs: Invalid input(s): PROCALCITONIN, LACTICIDVEN  Recent Results (from the past 240 hour(s))  Culture, blood (Routine x 2)     Status: None (Preliminary result)   Collection Time: 02/13/20  2:53 PM   Specimen: BLOOD  Result Value Ref Range Status   Specimen Description   Final    BLOOD LEFT ANTECUBITAL Performed at [facility], [address] [place], [place]    Special Requests   Final    BOTTLES DRAWN AEROBIC AND ANAEROBIC Blood Culture adequate volume Performed at [facility], [address], [place], [place]  Culture   Final    NO GROWTH 2 DAYS Performed at [facility], [address] [place], [place] [address]    Report Status PENDING  Incomplete  Respiratory Panel by RT PCR (Flu A&B, Covid) - Nasopharyngeal Swab     Status: Abnormal   Collection Time: 02/13/20  4:16 PM   Specimen: Nasopharyngeal Swab  Result Value Ref Range Status   SARS Coronavirus 2 by RT PCR POSITIVE (A) NEGATIVE Final    Comment: RESULT CALLED TO, READ BACK BY AND VERIFIED WITH: ZULETTA,K RN @1731  ON 02/13/20 JACKSON,K (NOTE) SARS-CoV-2 target nucleic acids are DETECTED.  SARS-CoV-2 RNA is generally detectable in upper respiratory specimens  during the acute phase of infection. Positive results are indicative of the presence of the identified virus, but do not rule out bacterial infection or co-infection with other pathogens not detected by the test. Clinical correlation with patient history and other diagnostic information is necessary to determine patient infection status. The expected result is Negative.  Fact Sheet for Patients:  04/14/20  Fact Sheet for Healthcare Providers: https://www.moore.com/  This test is not yet approved or cleared by the https://www.young.biz/ FDA and  has been authorized for  detection and/or diagnosis of SARS-CoV-2 by FDA under an Emergency Use Authorization (EUA).  This EUA will remain in effect (meaning this test c an be used) for the duration of  the COVID-19 declaration under Section 564(b)(1) of the Act, 21 U.S.C. section 360bbb-3(b)(1), unless the authorization is terminated or revoked sooner.      Influenza A by PCR NEGATIVE NEGATIVE Final   Influenza B by PCR NEGATIVE NEGATIVE Final    Comment: (NOTE) The Xpert Xpress SARS-CoV-2/FLU/RSV assay is intended as an aid in  the diagnosis of influenza from Nasopharyngeal swab specimens and  should not be used as a sole basis for treatment. Nasal washings and  aspirates are unacceptable for Xpert Xpress SARS-CoV-2/FLU/RSV  testing.  Fact Sheet for Patients: [place]  Fact Sheet for Healthcare Providers: https://www.moore.com/  This test is not yet approved or cleared by the https://www.young.biz/ FDA and  has been authorized for detection and/or diagnosis of SARS-CoV-2 by  FDA under an Emergency Use Authorization (EUA). This EUA will remain  in effect (meaning this test can be used) for the duration of the  Covid-19 declaration under Section 564(b)(1) of the Act, 21  U.S.C. section 360bbb-3(b)(1), unless the authorization is  terminated or revoked. Performed at [facility], [address], [place], [place]   Culture, blood (Routine x 2)     Status: None (Preliminary result)   Collection Time: 02/13/20  4:30 PM   Specimen: BLOOD  Result Value Ref Range Status   Specimen Description   Final    BLOOD RIGHT ARM Performed at [facility], [address] [place], [place]    Special Requests   Final    BOTTLES DRAWN AEROBIC AND ANAEROBIC Blood Culture results may not be optimal due to an inadequate volume of blood received in culture bottles Performed at [facility], [address], [place], [place]    Culture   Final    NO GROWTH 2 DAYS Performed at [facility], [address], [place], [place]    Report Status PENDING  Incomplete      Radiology Studies: No results found.  [id number], MD, PhD [facility]  Between 7 am - 7 pm I am  available, please contact me via Amion or Securechat  Between 7 pm - 7 am I am not available, please contact night coverage MD/APP via Amion

## 2020-02-16 NOTE — Plan of Care (Signed)

## 2020-02-16 NOTE — Progress Notes (Signed)
Pt transfer to 1524 alert and oriented times 4, V.S stable . Transfer via wheelchair.

## 2020-02-16 NOTE — Progress Notes (Signed)
Report giving to [facility] on 5 west rm 1524.

## 2020-02-17 DIAGNOSIS — U071 COVID-19: Secondary | ICD-10-CM | POA: Diagnosis not present

## 2020-02-17 DIAGNOSIS — J9601 Acute respiratory failure with hypoxia: Secondary | ICD-10-CM | POA: Diagnosis not present

## 2020-02-17 DIAGNOSIS — I10 Essential (primary) hypertension: Secondary | ICD-10-CM | POA: Diagnosis not present

## 2020-02-17 DIAGNOSIS — Z96642 Presence of left artificial hip joint: Secondary | ICD-10-CM | POA: Diagnosis not present

## 2020-02-17 LAB — COMPREHENSIVE METABOLIC PANEL
ALT: 72 U/L — ABNORMAL HIGH (ref 0–44)
AST: 51 U/L — ABNORMAL HIGH (ref 15–41)
Albumin: 3.2 g/dL — ABNORMAL LOW (ref 3.5–5.0)
Alkaline Phosphatase: 55 U/L (ref 38–126)
Anion gap: 14 (ref 5–15)
BUN: 39 mg/dL — ABNORMAL HIGH (ref 8–23)
CO2: 23 mmol/L (ref 22–32)
Calcium: 8.9 mg/dL (ref 8.9–10.3)
Chloride: 106 mmol/L (ref 98–111)
Creatinine, Ser: 0.99 mg/dL (ref 0.61–1.24)
GFR, Estimated: >60 mL/min (ref >60)
Glucose, Bld: 173 mg/dL — ABNORMAL HIGH (ref 70–99)
Potassium: 4.6 mmol/L (ref 3.5–5.1)
Sodium: 143 mmol/L (ref 135–145)
Total Bilirubin: 0.7 mg/dL (ref 0.3–1.2)
Total Protein: 6.6 g/dL (ref 6.5–8.1)

## 2020-02-17 LAB — FERRITIN: Ferritin: 1227 ng/mL — ABNORMAL HIGH (ref 24–336)

## 2020-02-17 LAB — C-REACTIVE PROTEIN: CRP: 1.5 mg/dL — ABNORMAL HIGH (ref <1.0)

## 2020-02-17 LAB — D-DIMER, QUANTITATIVE: D-Dimer, Quant: 0.6 ug/mL-FEU — ABNORMAL HIGH (ref 0.00–0.50)

## 2020-02-17 MED ORDER — FUROSEMIDE 10 MG/ML IJ SOLN
40.0000 mg | Freq: Once | INTRAMUSCULAR | Status: AC
  Administered 2020-02-17: 40 mg via INTRAVENOUS
  Filled 2020-02-17: qty 4

## 2020-02-17 NOTE — Progress Notes (Signed)
SATURATION QUALIFICATIONS: (This note is used to comply with regulatory documentation for home oxygen)  Patient Saturations on Room Air at Rest = 86%  Patient Saturations on Room Air while Ambulating = 84%  Patient Saturations on 5 Liters of oxygen while Ambulating = 92%  Please briefly explain why patient needs home oxygen: Patient desats with exertion and becomes short of breath. Patient will need oxygen at home to keep O2 sats above 90%.

## 2020-02-17 NOTE — Progress Notes (Signed)
   02/17/20 2023  Assess: MEWS Score  Temp 98 F (36.7 C)  BP (!) 134/94  Pulse Rate 87  Resp (!) 26  SpO2 (!) 84 %  Assess: MEWS Score  MEWS Temp 0  MEWS Systolic 0  MEWS Pulse 0  MEWS RR 2  MEWS LOC 0  MEWS Score 2  MEWS Score Color Yellow  Assess: if the MEWS score is Yellow or Red  Were vital signs taken at a resting state? Yes (NT states pt had just got off the phone.)  Focused Assessment No change from prior assessment  Early Detection of Sepsis Score *See Row Information* Low  MEWS guidelines implemented *See Row Information* No, vital signs rechecked  Treat  MEWS Interventions Other (Comment) (Notified APP [facility])  Notify: Charge Nurse/RN  Name of Charge Nurse/RN Notified Debbie, RN  Date Charge Nurse/RN Notified 02/17/20  Time Charge Nurse/RN Notified 2039  Notify: Provider  Provider Name/Title [facility]  Date Provider Notified 02/17/20  Time Provider Notified 2035  Notification Type Page  Notification Reason Other (Comment) (Change in MEWS score d/t respirations)  Response No new orders    Pt assessed and was observed in NAD, resting in bed comfortably. Pt denies feeling SOB or having any difficulty breathing. O2 Saturations improved to 92% on 5L HFNC. Notified APP [facility].

## 2020-02-17 NOTE — Progress Notes (Signed)
PROGRESS NOTE  Darren Wood FWY:[id number] DOB: [birth year] DOA: 02/13/2020 PCP: Loyal Jacobson, MD   LOS: 4 days   Brief Narrative / Interim history: 59 year old male with history of obesity, hypertension, hyperlipidemia, came into the hospital with cough, shortness of breath.  He has been having symptoms for the past 10 days, tested positive for Covid on 10/5, but over the last 3 to 4 days prior to admission he has experienced worsening shortness of breath.  He was hypoxic to 72% on room air on admission requiring 15 L nonrebreather.  Subjective / 24h Interval events: Overall feels stronger.  Still short of breath with walking however he has not been doing much.  Will ambulate more today.  Assessment & Plan: Principal Problem Acute Hypoxic Respiratory Failure due to Covid-19 Viral Illness -Continue supportive treatment, on 15 L and one-point with able to be weaned off, continues to improve and he is on 4 L this morning -Status post 5 days of remdesivir, continue steroids -Status post Actemra on 10/12 -Wean off oxygen as tolerated, prone if able, incentive spirometry, flutter valve  -Ambulate more today, anticipate discharge 24-48 hours based on how he does   COVID-19 Labs  Recent Labs    02/15/20 0610 02/16/20 0456 02/17/20 0428  DDIMER 0.29 0.30 0.60*  FERRITIN 1,383* 1,300* 1,227*  CRP 7.5* 3.3* 1.5*    Lab Results  Component Value Date   SARSCOV2NAA POSITIVE (A) 02/13/2020    Active Problems Essential hypertension -Remains normotensive, hold home medications  Obesity -BMI 32, patient would need sleep study as an outpatient, wife reports he snores heavily   Scheduled Meds: . (feeding supplement) PROSource Plus  30 mL Oral BID BM  . enoxaparin (LOVENOX) injection  40 mg Subcutaneous Q24H  . feeding supplement  237 mL Oral BID BM  . methylPREDNISolone (SOLU-MEDROL) injection  1 mg/kg Intravenous Q12H   Continuous Infusions: . remdesivir 100 mg in NS 100 mL 100  mg (02/17/20 1033)   PRN Meds:.acetaminophen, chlorpheniramine-HYDROcodone, guaiFENesin-dextromethorphan, ondansetron **OR** ondansetron (ZOFRAN) IV  DVT prophylaxis: Lovenox Code Status: Full code Family Communication: Leonid Manus, wife, [phone]  Status is: Inpatient  Remains inpatient appropriate because:Inpatient level of care appropriate due to severity of illness   Dispo: The patient is from: Home              Anticipated d/c is to: Home              Anticipated d/c date is: 2 days              Patient currently is not medically stable to d/c.  Consultants:  None   Procedures:  None   Microbiology: none  Antibacterials: None    Objective: Vitals:   02/16/20 1800 02/16/20 2009 02/17/20 0009 02/17/20 0529  BP:  116/73 127/78 (!) 156/87  Pulse:  84 79 86  Resp:  (!) 21 (!) 24 (!) 24  Temp:  98.2 F (36.8 C) 98.5 F (36.9 C) 97.8 F (36.6 C)  TempSrc:  Oral Oral Oral  SpO2: 100% 95% 99% 99%  Weight:      Height:        Intake/Output Summary (Last 24 hours) at 02/17/2020 1039 Last data filed at 02/17/2020 0557 Gross per 24 hour  Intake 1420 ml  Output 800 ml  Net 620 ml   Filed Weights   02/13/20 1622 02/13/20 2017  Weight: 110.2 kg 109.3 kg    Examination:  Constitutional: In bed, feels comfortable Eyes: No scleral icterus  ENMT: Moist mucous membranes Neck: normal, supple Respiratory: Faint rhonchi at the bases, no wheezing, increased respiratory effort, tachypneic Cardiovascular: Regular rate and rhythm, no murmurs, no edema Abdomen: Soft, NT, ND, positive bowel sounds Musculoskeletal: no clubbing / cyanosis.  Skin: No rashes appreciated Neurologic: Nonfocal, equal strength  Data Reviewed: I have independently reviewed following labs and imaging studies   CBC: Recent Labs  Lab 02/13/20 1453  WBC 9.1  NEUTROABS 6.6  HGB 14.2  HCT 43.3  MCV 87.7  PLT 256   Basic Metabolic Panel: Recent Labs  Lab 02/13/20 1453 02/14/20 0521  02/15/20 0610 02/16/20 0456 02/17/20 0428  NA 137 137 136 140 143  K 3.9 4.4 4.5 4.4 4.6  CL 100 102 103 104 106  CO2 24 22 22 22 23   GLUCOSE 117* 139* 154* 158* 173*  BUN 32* 29* 42* 41* 39*  CREATININE 1.09 0.88 0.99 0.90 0.99  CALCIUM 8.2* 8.4* 8.4* 8.8* 8.9   GFR: Estimated Creatinine Clearance: 100.1 mL/min (by C-G formula based on SCr of 0.99 mg/dL). Liver Function Tests: Recent Labs  Lab 02/13/20 1453 02/14/20 0521 02/15/20 0610 02/16/20 0456 02/17/20 0428  AST 59* 57* 52* 54* 51*  ALT 35 40 44 60* 72*  ALKPHOS 59 55 53 49 55  BILITOT 0.8 0.8 0.5 0.8 0.7  PROT 7.4 7.1 6.9 7.1 6.6  ALBUMIN 3.4* 3.0* 3.1* 3.2* 3.2*   No results for input(s): LIPASE, AMYLASE in the last 168 hours. No results for input(s): AMMONIA in the last 168 hours. Coagulation Profile: Recent Labs  Lab 02/13/20 1453  INR 1.1   Cardiac Enzymes: No results for input(s): CKTOTAL, CKMB, CKMBINDEX, TROPONINI in the last 168 hours. BNP (last 3 results) No results for input(s): PROBNP in the last 8760 hours. HbA1C: No results for input(s): HGBA1C in the last 72 hours. CBG: No results for input(s): GLUCAP in the last 168 hours. Lipid Profile: No results for input(s): CHOL, HDL, LDLCALC, TRIG, CHOLHDL, LDLDIRECT in the last 72 hours. Thyroid Function Tests: No results for input(s): TSH, T4TOTAL, FREET4, T3FREE, THYROIDAB in the last 72 hours. Anemia Panel: Recent Labs    02/16/20 0456 02/17/20 0428  FERRITIN 1,300* 1,227*   Urine analysis:    Component Value Date/Time   COLORURINE YELLOW 02/13/2020 1800   APPEARANCEUR CLEAR 02/13/2020 1800   LABSPEC 1.029 02/13/2020 1800   PHURINE 5.0 02/13/2020 1800   GLUCOSEU NEGATIVE 02/13/2020 1800   HGBUR NEGATIVE 02/13/2020 1800   BILIRUBINUR NEGATIVE 02/13/2020 1800   KETONESUR NEGATIVE 02/13/2020 1800   PROTEINUR NEGATIVE 02/13/2020 1800   NITRITE NEGATIVE 02/13/2020 1800   LEUKOCYTESUR NEGATIVE 02/13/2020 1800   Sepsis Labs: Invalid  input(s): PROCALCITONIN, LACTICIDVEN  Recent Results (from the past 240 hour(s))  Culture, blood (Routine x 2)     Status: None (Preliminary result)   Collection Time: 02/13/20  2:53 PM   Specimen: BLOOD  Result Value Ref Range Status   Specimen Description   Final    BLOOD LEFT ANTECUBITAL Performed at [facility], [address], [place], [place]    Special Requests   Final    BOTTLES DRAWN AEROBIC AND ANAEROBIC Blood Culture adequate volume Performed at [facility], [address], [place], [place]    Culture   Final    NO GROWTH 4 DAYS Performed at [facility], [address], [place], [place]    Report Status PENDING  Incomplete  Respiratory Panel by RT  PCR (Flu A&B, Covid) - Nasopharyngeal Swab     Status: Abnormal   Collection Time: 02/13/20  4:16 PM   Specimen: Nasopharyngeal Swab  Result Value Ref Range Status   SARS Coronavirus 2 by RT PCR POSITIVE (A) NEGATIVE Final    Comment: RESULT CALLED TO, READ BACK BY AND VERIFIED WITH: ZULETTA,K RN @1731  ON 02/13/20 JACKSON,K (NOTE) SARS-CoV-2 target nucleic acids are DETECTED.  SARS-CoV-2 RNA is generally detectable in upper respiratory specimens  during the acute phase of infection. Positive results are indicative of the presence of the identified virus, but do not rule out bacterial infection or co-infection with other pathogens not detected by the test. Clinical correlation with patient history and other diagnostic information is necessary to determine patient infection status. The expected result is Negative.  Fact Sheet for Patients:  04/14/20  Fact Sheet for Healthcare Providers: https://www.moore.com/  This test is not yet approved or cleared by the https://www.young.biz/ FDA and  has been authorized for detection and/or diagnosis of SARS-CoV-2 by FDA under an Emergency Use Authorization  (EUA).  This EUA will remain in effect (meaning this test c an be used) for the duration of  the COVID-19 declaration under Section 564(b)(1) of the Act, 21 U.S.C. section 360bbb-3(b)(1), unless the authorization is terminated or revoked sooner.      Influenza A by PCR NEGATIVE NEGATIVE Final   Influenza B by PCR NEGATIVE NEGATIVE Final    Comment: (NOTE) The Xpert Xpress SARS-CoV-2/FLU/RSV assay is intended as an aid in  the diagnosis of influenza from Nasopharyngeal swab specimens and  should not be used as a sole basis for treatment. Nasal washings and  aspirates are unacceptable for Xpert Xpress SARS-CoV-2/FLU/RSV  testing.  Fact Sheet for Patients: [place]  Fact Sheet for Healthcare Providers: https://www.moore.com/  This test is not yet approved or cleared by the https://www.young.biz/ FDA and  has been authorized for detection and/or diagnosis of SARS-CoV-2 by  FDA under an Emergency Use Authorization (EUA). This EUA will remain  in effect (meaning this test can be used) for the duration of the  Covid-19 declaration under Section 564(b)(1) of the Act, 21  U.S.C. section 360bbb-3(b)(1), unless the authorization is  terminated or revoked. Performed at [facility], [address], [place], [place]   Culture, blood (Routine x 2)     Status: None (Preliminary result)   Collection Time: 02/13/20  4:30 PM   Specimen: BLOOD  Result Value Ref Range Status   Specimen Description   Final    BLOOD RIGHT ARM Performed at [facility], [address], [place], [place]    Special Requests   Final    BOTTLES DRAWN AEROBIC AND ANAEROBIC Blood Culture results may not be optimal due to an inadequate volume of blood received in culture bottles Performed at [facility], [address], [place], [place]    Culture   Final    NO GROWTH 4 DAYS Performed at  [facility], [address] [place], [place]    Report Status PENDING  Incomplete      Radiology Studies: No results found.  [id number], MD, PhD [facility]  Between 7 am - 7 pm I am available, please contact me via Amion or Securechat  Between 7 pm - 7 am I am not available, please contact night coverage MD/APP via Amion

## 2020-02-18 DIAGNOSIS — U071 COVID-19: Secondary | ICD-10-CM | POA: Diagnosis not present

## 2020-02-18 DIAGNOSIS — J9601 Acute respiratory failure with hypoxia: Secondary | ICD-10-CM | POA: Diagnosis not present

## 2020-02-18 DIAGNOSIS — I10 Essential (primary) hypertension: Secondary | ICD-10-CM | POA: Diagnosis not present

## 2020-02-18 DIAGNOSIS — Z96642 Presence of left artificial hip joint: Secondary | ICD-10-CM | POA: Diagnosis not present

## 2020-02-18 LAB — COMPREHENSIVE METABOLIC PANEL
ALT: 67 U/L — ABNORMAL HIGH (ref 0–44)
AST: 38 U/L (ref 15–41)
Albumin: 3.4 g/dL — ABNORMAL LOW (ref 3.5–5.0)
Alkaline Phosphatase: 61 U/L (ref 38–126)
Anion gap: 16 — ABNORMAL HIGH (ref 5–15)
BUN: 45 mg/dL — ABNORMAL HIGH (ref 8–23)
CO2: 25 mmol/L (ref 22–32)
Calcium: 9.4 mg/dL (ref 8.9–10.3)
Chloride: 105 mmol/L (ref 98–111)
Creatinine, Ser: 1.04 mg/dL (ref 0.61–1.24)
GFR, Estimated: >60 mL/min (ref >60)
Glucose, Bld: 169 mg/dL — ABNORMAL HIGH (ref 70–99)
Potassium: 4.5 mmol/L (ref 3.5–5.1)
Sodium: 146 mmol/L — ABNORMAL HIGH (ref 135–145)
Total Bilirubin: 0.7 mg/dL (ref 0.3–1.2)
Total Protein: 7.1 g/dL (ref 6.5–8.1)

## 2020-02-18 LAB — CULTURE, BLOOD (ROUTINE X 2): Culture: NO GROWTH

## 2020-02-18 LAB — D-DIMER, QUANTITATIVE: D-Dimer, Quant: 0.69 ug/mL-FEU — ABNORMAL HIGH (ref 0.00–0.50)

## 2020-02-18 LAB — C-REACTIVE PROTEIN: CRP: 0.9 mg/dL (ref <1.0)

## 2020-02-18 LAB — FERRITIN: Ferritin: 1066 ng/mL — ABNORMAL HIGH (ref 24–336)

## 2020-02-18 MED ORDER — FUROSEMIDE 10 MG/ML IJ SOLN
40.0000 mg | Freq: Once | INTRAMUSCULAR | Status: AC
  Administered 2020-02-18: 40 mg via INTRAVENOUS
  Filled 2020-02-18: qty 4

## 2020-02-18 NOTE — Progress Notes (Signed)
PROGRESS NOTE  Darren Wood JJO:[id number] DOB: 10-08-1960 DOA: 02/13/2020 PCP: Loyal Jacobson, MD   LOS: 5 days   Brief Narrative / Interim history: 59 year old male with history of obesity, hypertension, hyperlipidemia, came into the hospital with cough, shortness of breath.  He has been having symptoms for the past 10 days, tested positive for Covid on 10/5, but over the last 3 to 4 days prior to admission he has experienced worsening shortness of breath.  He was hypoxic to 72% on room air on admission requiring 15 L nonrebreather.  Subjective / 24h Interval events: Feels better, still short of breath, satting into the 80s on 5 L with walking.  Feels well at rest.  No chest pain  Assessment & Plan: Principal Problem Acute Hypoxic Respiratory Failure due to Covid-19 Viral Illness -Continue supportive treatment, on 15 L and one-point with able to be weaned off, continues to improve he is on 5 L at rest -Status post 5 days of remdesivir, continue steroids -Status post Actemra on 10/12 -Wean off oxygen as tolerated, prone if able, incentive spirometry, flutter valve  -Continue to ambulate more, still desaturating pretty significantly with walking, not ready for discharge just yet   COVID-19 Labs  Recent Labs    02/16/20 0456 02/17/20 0428 02/18/20 0601  DDIMER 0.30 0.60* 0.69*  FERRITIN 1,300* 1,227* 1,066*  CRP 3.3* 1.5* 0.9    Lab Results  Component Value Date   SARSCOV2NAA POSITIVE (A) 02/13/2020    Active Problems Essential hypertension -Remains normotensive, hold home medications  Obesity -BMI 32, patient would need sleep study as an outpatient, wife reports he snores heavily   Scheduled Meds: . (feeding supplement) PROSource Plus  30 mL Oral BID BM  . enoxaparin (LOVENOX) injection  40 mg Subcutaneous Q24H  . feeding supplement  237 mL Oral BID BM  . methylPREDNISolone (SOLU-MEDROL) injection  1 mg/kg Intravenous Q12H   Continuous Infusions:  PRN  Meds:.acetaminophen, chlorpheniramine-HYDROcodone, guaiFENesin-dextromethorphan, ondansetron **OR** ondansetron (ZOFRAN) IV  DVT prophylaxis: Lovenox Code Status: Full code Family Communication: Shamell Suarez, wife, [phone]  Status is: Inpatient  Remains inpatient appropriate because:Inpatient level of care appropriate due to severity of illness  Dispo: The patient is from: Home              Anticipated d/c is to: Home              Anticipated d/c date is: 2 days              Patient currently is not medically stable to d/c.  Consultants:  None   Procedures:  None   Microbiology: none  Antibacterials: None    Objective: Vitals:   02/17/20 2242 02/18/20 0024 02/18/20 0416 02/18/20 0847  BP:  (!) 154/97 (!) 143/75 (!) 157/77  Pulse: 80 84 81 93  Resp:  (!) 26 (!) 24 20  Temp:  97.8 F (36.6 C) 97.8 F (36.6 C) (!) 97.1 F (36.2 C)  TempSrc:  Oral Oral   SpO2: (!) 89% 95% 90% (!) 78%  Weight:      Height:        Intake/Output Summary (Last 24 hours) at 02/18/2020 1103 Last data filed at 02/18/2020 1000 Gross per 24 hour  Intake 240 ml  Output 1450 ml  Net -1210 ml   Filed Weights   02/13/20 1622 02/13/20 2017  Weight: 110.2 kg 109.3 kg    Examination:  Constitutional: Comfortable, in bed Eyes: No icterus ENMT: Moist mucous membranes Neck: normal, supple  Respiratory: Faint rhonchi, no wheezing, tachypneic at times Cardiovascular: Regular rate and rhythm, no murmurs, no edema Abdomen: Soft, nontender, nondistended, bowel sounds positive Musculoskeletal: no clubbing / cyanosis.  Skin: No rashes seen Neurologic: No focal deficits, equal strength  Data Reviewed: I have independently reviewed following labs and imaging studies   CBC: Recent Labs  Lab 02/13/20 1453  WBC 9.1  NEUTROABS 6.6  HGB 14.2  HCT 43.3  MCV 87.7  PLT 256   Basic Metabolic Panel: Recent Labs  Lab 02/14/20 0521 02/15/20 0610 02/16/20 0456 02/17/20 0428 02/18/20 0601   NA 137 136 140 143 146*  K 4.4 4.5 4.4 4.6 4.5  CL 102 103 104 106 105  CO2 22 22 22 23 25   GLUCOSE 139* 154* 158* 173* 169*  BUN 29* 42* 41* 39* 45*  CREATININE 0.88 0.99 0.90 0.99 1.04  CALCIUM 8.4* 8.4* 8.8* 8.9 9.4   GFR: Estimated Creatinine Clearance: 95.3 mL/min (by C-G formula based on SCr of 1.04 mg/dL). Liver Function Tests: Recent Labs  Lab 02/14/20 0521 02/15/20 0610 02/16/20 0456 02/17/20 0428 02/18/20 0601  AST 57* 52* 54* 51* 38  ALT 40 44 60* 72* 67*  ALKPHOS 55 53 49 55 61  BILITOT 0.8 0.5 0.8 0.7 0.7  PROT 7.1 6.9 7.1 6.6 7.1  ALBUMIN 3.0* 3.1* 3.2* 3.2* 3.4*   No results for input(s): LIPASE, AMYLASE in the last 168 hours. No results for input(s): AMMONIA in the last 168 hours. Coagulation Profile: Recent Labs  Lab 02/13/20 1453  INR 1.1   Cardiac Enzymes: No results for input(s): CKTOTAL, CKMB, CKMBINDEX, TROPONINI in the last 168 hours. BNP (last 3 results) No results for input(s): PROBNP in the last 8760 hours. HbA1C: No results for input(s): HGBA1C in the last 72 hours. CBG: No results for input(s): GLUCAP in the last 168 hours. Lipid Profile: No results for input(s): CHOL, HDL, LDLCALC, TRIG, CHOLHDL, LDLDIRECT in the last 72 hours. Thyroid Function Tests: No results for input(s): TSH, T4TOTAL, FREET4, T3FREE, THYROIDAB in the last 72 hours. Anemia Panel: Recent Labs    02/17/20 0428 02/18/20 0601  FERRITIN 1,227* 1,066*   Urine analysis:    Component Value Date/Time   COLORURINE YELLOW 02/13/2020 1800   APPEARANCEUR CLEAR 02/13/2020 1800   LABSPEC 1.029 02/13/2020 1800   PHURINE 5.0 02/13/2020 1800   GLUCOSEU NEGATIVE 02/13/2020 1800   HGBUR NEGATIVE 02/13/2020 1800   BILIRUBINUR NEGATIVE 02/13/2020 1800   KETONESUR NEGATIVE 02/13/2020 1800   PROTEINUR NEGATIVE 02/13/2020 1800   NITRITE NEGATIVE 02/13/2020 1800   LEUKOCYTESUR NEGATIVE 02/13/2020 1800   Sepsis Labs: Invalid input(s): PROCALCITONIN, LACTICIDVEN  Recent  Results (from the past 240 hour(s))  Culture, blood (Routine x 2)     Status: None   Collection Time: 02/13/20  2:53 PM   Specimen: BLOOD  Result Value Ref Range Status   Specimen Description   Final    BLOOD LEFT ANTECUBITAL Performed at [facility], [address], [place], [place]    Special Requests   Final    BOTTLES DRAWN AEROBIC AND ANAEROBIC Blood Culture adequate volume Performed at [facility], [address] [place], [place]    Culture   Final    NO GROWTH 5 DAYS Performed at [facility], [address], [place], [place]    Report Status 02/18/2020 FINAL  Final  Respiratory Panel by RT PCR (Flu A&B, Covid) - Nasopharyngeal Swab  Status: Abnormal   Collection Time: 02/13/20  4:16 PM   Specimen: Nasopharyngeal Swab  Result Value Ref Range Status   SARS Coronavirus 2 by RT PCR POSITIVE (A) NEGATIVE Final    Comment: RESULT CALLED TO, READ BACK BY AND VERIFIED WITH: ZULETTA,K RN @1731  ON 02/13/20 JACKSON,K (NOTE) SARS-CoV-2 target nucleic acids are DETECTED.  SARS-CoV-2 RNA is generally detectable in upper respiratory specimens  during the acute phase of infection. Positive results are indicative of the presence of the identified virus, but do not rule out bacterial infection or co-infection with other pathogens not detected by the test. Clinical correlation with patient history and other diagnostic information is necessary to determine patient infection status. The expected result is Negative.  Fact Sheet for Patients:  04/14/20  Fact Sheet for Healthcare Providers: https://www.moore.com/  This test is not yet approved or cleared by the https://www.young.biz/ FDA and  has been authorized for detection and/or diagnosis of SARS-CoV-2 by FDA under an Emergency Use Authorization (EUA).  This EUA will remain in effect (meaning this test c an be  used) for the duration of  the COVID-19 declaration under Section 564(b)(1) of the Act, 21 U.S.C. section 360bbb-3(b)(1), unless the authorization is terminated or revoked sooner.      Influenza A by PCR NEGATIVE NEGATIVE Final   Influenza B by PCR NEGATIVE NEGATIVE Final    Comment: (NOTE) The Xpert Xpress SARS-CoV-2/FLU/RSV assay is intended as an aid in  the diagnosis of influenza from Nasopharyngeal swab specimens and  should not be used as a sole basis for treatment. Nasal washings and  aspirates are unacceptable for Xpert Xpress SARS-CoV-2/FLU/RSV  testing.  Fact Sheet for Patients: [place]  Fact Sheet for Healthcare Providers: https://www.moore.com/  This test is not yet approved or cleared by the https://www.young.biz/ FDA and  has been authorized for detection and/or diagnosis of SARS-CoV-2 by  FDA under an Emergency Use Authorization (EUA). This EUA will remain  in effect (meaning this test can be used) for the duration of the  Covid-19 declaration under Section 564(b)(1) of the Act, 21  U.S.C. section 360bbb-3(b)(1), unless the authorization is  terminated or revoked. Performed at [facility], [address] [place], [place]   Culture, blood (Routine x 2)     Status: None   Collection Time: 02/13/20  4:30 PM   Specimen: BLOOD  Result Value Ref Range Status   Specimen Description   Final    BLOOD RIGHT ARM Performed at [facility], [address] [place], [place]    Special Requests   Final    BOTTLES DRAWN AEROBIC AND ANAEROBIC Blood Culture results may not be optimal due to an inadequate volume of blood received in culture bottles Performed at [facility], [address] [place], [place]    Culture   Final    NO GROWTH 5 DAYS Performed at [facility], [address] [place], [place]    Report Status  02/18/2020 FINAL  Final      Radiology Studies: No results found.  02/20/2020, MD, PhD [facility]  Between 7 am - 7 pm I am available, please contact me via Amion or Securechat  Between 7 pm - 7 am I am not available, please contact night coverage MD/APP via Amion

## 2020-02-18 NOTE — Progress Notes (Addendum)
SATURATION QUALIFICATIONS: (This note is used to comply with regulatory documentation for home oxygen)  Patient Saturations on Room Air at Rest = 85%  Patient Saturations on Room Air while Ambulating = 79-81%  Patient Saturations on 5L Liters of oxygen while Ambulating = 87-88 %  During assessment patient becomes short of breath and tachypeniec on ambulation at RA. Saturation drops down to 79-81%. Restarted oxygen via nasal canula at 5L, saturation improved and reached to 87-88%. Pt. Unable to tolerate ambulation at room air without being short of breath. Will continue to monitor and reassess. MD was notified.

## 2020-02-19 DIAGNOSIS — I10 Essential (primary) hypertension: Secondary | ICD-10-CM | POA: Diagnosis not present

## 2020-02-19 DIAGNOSIS — J9601 Acute respiratory failure with hypoxia: Secondary | ICD-10-CM | POA: Diagnosis not present

## 2020-02-19 DIAGNOSIS — U071 COVID-19: Secondary | ICD-10-CM | POA: Diagnosis not present

## 2020-02-19 DIAGNOSIS — Z96642 Presence of left artificial hip joint: Secondary | ICD-10-CM | POA: Diagnosis not present

## 2020-02-19 NOTE — Progress Notes (Signed)
This nurse will continue to encourage patient to sit up in reclining chair today and will keep encouraging fluids.

## 2020-02-19 NOTE — Progress Notes (Signed)
PROGRESS NOTE  Keyon Liller NWG:[id number] DOB: [birth year] DOA: 02/13/2020 PCP: Loyal Jacobson, MD   LOS: 6 days   Brief Narrative / Interim history: 59 year old male with history of obesity, hypertension, hyperlipidemia, came into the hospital with cough, shortness of breath.  He has been having symptoms for the past 10 days, tested positive for Covid on 10/5, but over the last 3 to 4 days prior to admission he has experienced worsening shortness of breath.  He was hypoxic to 72% on room air on admission requiring 15 L nonrebreather.  Subjective / 24h Interval events: Felt lightheaded yesterday when he ambulated.  Had some nausea and one episode of vomiting this morning.  Assessment & Plan: Principal Problem Acute Hypoxic Respiratory Failure due to Covid-19 Viral Illness -Continue supportive treatment, on 15 L and one-point with able to be weaned off, remains on 5 L at rest but requiring 6-7 with activity -Status post 5 days of remdesivir -Continue steroids -Status post Actemra on 10/12 -Wean off oxygen as tolerated, prone if able, incentive spirometry, flutter valve  -Encourage ambulation   COVID-19 Labs  Recent Labs    02/17/20 0428 02/18/20 0601  DDIMER 0.60* 0.69*  FERRITIN 1,227* 1,066*  CRP 1.5* 0.9    Lab Results  Component Value Date   SARSCOV2NAA POSITIVE (A) 02/13/2020    Active Problems Nausea, vomiting -X1 today, patient refused antiemetics.  Monitor.  Essential hypertension -Remains normotensive, hold home medications  Obesity -BMI 32, patient would need sleep study as an outpatient, wife reports he snores heavily   Scheduled Meds:  (feeding supplement) PROSource Plus  30 mL Oral BID BM   enoxaparin (LOVENOX) injection  40 mg Subcutaneous Q24H   feeding supplement  237 mL Oral BID BM   methylPREDNISolone (SOLU-MEDROL) injection  1 mg/kg Intravenous Q12H   Continuous Infusions:  PRN Meds:.acetaminophen, chlorpheniramine-HYDROcodone,  guaiFENesin-dextromethorphan, ondansetron **OR** ondansetron (ZOFRAN) IV  DVT prophylaxis: Lovenox Code Status: Full code Family Communication: Mathew Postiglione, wife, [phone]  Status is: Inpatient  Remains inpatient appropriate because:Inpatient level of care appropriate due to severity of illness  Dispo: The patient is from: Home              Anticipated d/c is to: Home              Anticipated d/c date is: 2 days              Patient currently is not medically stable to d/c.  Consultants:  None   Procedures:  None   Microbiology: none  Antibacterials: None    Objective: Vitals:   02/18/20 1415 02/18/20 2059 02/18/20 2100 02/19/20 0508  BP: (!) 156/95 (!) 122/92  135/84  Pulse: (!) 105 89  91  Resp: 14 (!) 22  (!) 24  Temp: (!) 97.5 F (36.4 C) 97.6 F (36.4 C)  97.8 F (36.6 C)  TempSrc:  Oral  Oral  SpO2: (!) 83% (!) 86% 93% 97%  Weight:      Height:        Intake/Output Summary (Last 24 hours) at 02/19/2020 1107 Last data filed at 02/19/2020 0900 Gross per 24 hour  Intake 480 ml  Output 1400 ml  Net -920 ml   Filed Weights   02/13/20 1622 02/13/20 2017  Weight: 110.2 kg 109.3 kg    Examination:  Constitutional: In bed, no significant distress Eyes: No scleral icterus ENMT: Moist mucous membranes Neck: normal, supple Respiratory: Diminished at the bases, no wheezing, no crackles, Cardiovascular: Regular rate  and rhythm, no murmurs, no peripheral edema Abdomen: Soft, NT, ND, bowel sounds positive Musculoskeletal: no clubbing / cyanosis.  Skin: No rashes seen Neurologic: Nonfocal, equal strength  Data Reviewed: I have independently reviewed following labs and imaging studies   CBC: Recent Labs  Lab 02/13/20 1453  WBC 9.1  NEUTROABS 6.6  HGB 14.2  HCT 43.3  MCV 87.7  PLT 256   Basic Metabolic Panel: Recent Labs  Lab 02/14/20 0521 02/15/20 0610 02/16/20 0456 02/17/20 0428 02/18/20 0601  NA 137 136 140 143 146*  K 4.4 4.5 4.4  4.6 4.5  CL 102 103 104 106 105  CO2 22 22 22 23 25   GLUCOSE 139* 154* 158* 173* 169*  BUN 29* 42* 41* 39* 45*  CREATININE 0.88 0.99 0.90 0.99 1.04  CALCIUM 8.4* 8.4* 8.8* 8.9 9.4   GFR: Estimated Creatinine Clearance: 95.3 mL/min (by C-G formula based on SCr of 1.04 mg/dL). Liver Function Tests: Recent Labs  Lab 02/14/20 0521 02/15/20 0610 02/16/20 0456 02/17/20 0428 02/18/20 0601  AST 57* 52* 54* 51* 38  ALT 40 44 60* 72* 67*  ALKPHOS 55 53 49 55 61  BILITOT 0.8 0.5 0.8 0.7 0.7  PROT 7.1 6.9 7.1 6.6 7.1  ALBUMIN 3.0* 3.1* 3.2* 3.2* 3.4*   No results for input(s): LIPASE, AMYLASE in the last 168 hours. No results for input(s): AMMONIA in the last 168 hours. Coagulation Profile: Recent Labs  Lab 02/13/20 1453  INR 1.1   Cardiac Enzymes: No results for input(s): CKTOTAL, CKMB, CKMBINDEX, TROPONINI in the last 168 hours. BNP (last 3 results) No results for input(s): PROBNP in the last 8760 hours. HbA1C: No results for input(s): HGBA1C in the last 72 hours. CBG: No results for input(s): GLUCAP in the last 168 hours. Lipid Profile: No results for input(s): CHOL, HDL, LDLCALC, TRIG, CHOLHDL, LDLDIRECT in the last 72 hours. Thyroid Function Tests: No results for input(s): TSH, T4TOTAL, FREET4, T3FREE, THYROIDAB in the last 72 hours. Anemia Panel: Recent Labs    02/17/20 0428 02/18/20 0601  FERRITIN 1,227* 1,066*   Urine analysis:    Component Value Date/Time   COLORURINE YELLOW 02/13/2020 1800   APPEARANCEUR CLEAR 02/13/2020 1800   LABSPEC 1.029 02/13/2020 1800   PHURINE 5.0 02/13/2020 1800   GLUCOSEU NEGATIVE 02/13/2020 1800   HGBUR NEGATIVE 02/13/2020 1800   BILIRUBINUR NEGATIVE 02/13/2020 1800   KETONESUR NEGATIVE 02/13/2020 1800   PROTEINUR NEGATIVE 02/13/2020 1800   NITRITE NEGATIVE 02/13/2020 1800   LEUKOCYTESUR NEGATIVE 02/13/2020 1800   Sepsis Labs: Invalid input(s): PROCALCITONIN, LACTICIDVEN  Recent Results (from the past 240 hour(s))    Culture, blood (Routine x 2)     Status: None   Collection Time: 02/13/20  2:53 PM   Specimen: BLOOD  Result Value Ref Range Status   Specimen Description   Final    BLOOD LEFT ANTECUBITAL Performed at [facility], [address] [place], [place]    Special Requests   Final    BOTTLES DRAWN AEROBIC AND ANAEROBIC Blood Culture adequate volume Performed at [facility], [address] [place], [place]    Culture   Final    NO GROWTH 5 DAYS Performed at [facility], [address], [place], [place]    Report Status 02/18/2020 FINAL  Final  Respiratory Panel by RT PCR (Flu A&B, Covid) - Nasopharyngeal Swab     Status: Abnormal   Collection Time: 02/13/20  4:16 PM  Specimen: Nasopharyngeal Swab  Result Value Ref Range Status   SARS Coronavirus 2 by RT PCR POSITIVE (A) NEGATIVE Final    Comment: RESULT CALLED TO, READ BACK BY AND VERIFIED WITH: ZULETTA,K RN @1731  ON 02/13/20 JACKSON,K (NOTE) SARS-CoV-2 target nucleic acids are DETECTED.  SARS-CoV-2 RNA is generally detectable in upper respiratory specimens  during the acute phase of infection. Positive results are indicative of the presence of the identified virus, but do not rule out bacterial infection or co-infection with other pathogens not detected by the test. Clinical correlation with patient history and other diagnostic information is necessary to determine patient infection status. The expected result is Negative.  Fact Sheet for Patients:  04/14/20  Fact Sheet for Healthcare Providers: https://www.moore.com/  This test is not yet approved or cleared by the https://www.young.biz/ FDA and  has been authorized for detection and/or diagnosis of SARS-CoV-2 by FDA under an Emergency Use Authorization (EUA).  This EUA will remain in effect (meaning this test c an be used) for the duration of  the  COVID-19 declaration under Section 564(b)(1) of the Act, 21 U.S.C. section 360bbb-3(b)(1), unless the authorization is terminated or revoked sooner.      Influenza A by PCR NEGATIVE NEGATIVE Final   Influenza B by PCR NEGATIVE NEGATIVE Final    Comment: (NOTE) The Xpert Xpress SARS-CoV-2/FLU/RSV assay is intended as an aid in  the diagnosis of influenza from Nasopharyngeal swab specimens and  should not be used as a sole basis for treatment. Nasal washings and  aspirates are unacceptable for Xpert Xpress SARS-CoV-2/FLU/RSV  testing.  Fact Sheet for Patients: [place]  Fact Sheet for Healthcare Providers: https://www.moore.com/  This test is not yet approved or cleared by the https://www.young.biz/ FDA and  has been authorized for detection and/or diagnosis of SARS-CoV-2 by  FDA under an Emergency Use Authorization (EUA). This EUA will remain  in effect (meaning this test can be used) for the duration of the  Covid-19 declaration under Section 564(b)(1) of the Act, 21  U.S.C. section 360bbb-3(b)(1), unless the authorization is  terminated or revoked. Performed at [facility], [address], [place], [place]   Culture, blood (Routine x 2)     Status: None   Collection Time: 02/13/20  4:30 PM   Specimen: BLOOD  Result Value Ref Range Status   Specimen Description   Final    BLOOD RIGHT ARM Performed at [facility], [address], [place], [place]    Special Requests   Final    BOTTLES DRAWN AEROBIC AND ANAEROBIC Blood Culture results may not be optimal due to an inadequate volume of blood received in culture bottles Performed at [facility], [address], [place], [place]    Culture   Final    NO GROWTH 5 DAYS Performed at [facility], [address], [place], [place]    Report Status 02/18/2020 FINAL  Final       Radiology Studies: No results found.  02/20/2020, MD, PhD [facility]  Between 7 am - 7 pm I am available, please contact me via Amion or Securechat  Between 7 pm - 7 am I am not available, please contact night coverage MD/APP via Amion

## 2020-02-20 ENCOUNTER — Inpatient Hospital Stay (HOSPITAL_COMMUNITY): Payer: BC Managed Care – PPO

## 2020-02-20 DIAGNOSIS — I10 Essential (primary) hypertension: Secondary | ICD-10-CM | POA: Diagnosis not present

## 2020-02-20 DIAGNOSIS — U071 COVID-19: Secondary | ICD-10-CM | POA: Diagnosis not present

## 2020-02-20 DIAGNOSIS — J9601 Acute respiratory failure with hypoxia: Secondary | ICD-10-CM | POA: Diagnosis not present

## 2020-02-20 DIAGNOSIS — Z96642 Presence of left artificial hip joint: Secondary | ICD-10-CM | POA: Diagnosis not present

## 2020-02-20 LAB — COMPREHENSIVE METABOLIC PANEL
ALT: 47 U/L — ABNORMAL HIGH (ref 0–44)
AST: 31 U/L (ref 15–41)
Albumin: 3.4 g/dL — ABNORMAL LOW (ref 3.5–5.0)
Alkaline Phosphatase: 63 U/L (ref 38–126)
Anion gap: 13 (ref 5–15)
BUN: 40 mg/dL — ABNORMAL HIGH (ref 8–23)
CO2: 24 mmol/L (ref 22–32)
Calcium: 8.9 mg/dL (ref 8.9–10.3)
Chloride: 103 mmol/L (ref 98–111)
Creatinine, Ser: 1.08 mg/dL (ref 0.61–1.24)
GFR, Estimated: >60 mL/min (ref >60)
Glucose, Bld: 201 mg/dL — ABNORMAL HIGH (ref 70–99)
Potassium: 4.8 mmol/L (ref 3.5–5.1)
Sodium: 140 mmol/L (ref 135–145)
Total Bilirubin: 0.8 mg/dL (ref 0.3–1.2)
Total Protein: 6.8 g/dL (ref 6.5–8.1)

## 2020-02-20 LAB — MAGNESIUM: Magnesium: 2.5 mg/dL — ABNORMAL HIGH (ref 1.7–2.4)

## 2020-02-20 LAB — CBC
HCT: 48.3 % (ref 39.0–52.0)
Hemoglobin: 16.3 g/dL (ref 13.0–17.0)
MCH: 29.4 pg (ref 26.0–34.0)
MCHC: 33.7 g/dL (ref 30.0–36.0)
MCV: 87 fL (ref 80.0–100.0)
Platelets: 457 10*3/uL — ABNORMAL HIGH (ref 150–400)
RBC: 5.55 MIL/uL (ref 4.22–5.81)
RDW: 14.8 % (ref 11.5–15.5)
WBC: 33.2 10*3/uL — ABNORMAL HIGH (ref 4.0–10.5)
nRBC: 0 % (ref 0.0–0.2)

## 2020-02-20 LAB — D-DIMER, QUANTITATIVE: D-Dimer, Quant: 0.95 ug/mL-FEU — ABNORMAL HIGH (ref 0.00–0.50)

## 2020-02-20 LAB — C-REACTIVE PROTEIN: CRP: 0.5 mg/dL (ref <1.0)

## 2020-02-20 MED ORDER — SODIUM CHLORIDE 0.9 % IV SOLN
3.0000 g | Freq: Four times a day (QID) | INTRAVENOUS | Status: DC
  Administered 2020-02-20 – 2020-02-22 (×9): 3 g via INTRAVENOUS
  Filled 2020-02-20 (×5): qty 3
  Filled 2020-02-20: qty 8
  Filled 2020-02-20 (×2): qty 3
  Filled 2020-02-20 (×2): qty 8

## 2020-02-20 NOTE — Progress Notes (Signed)
Pharmacy Antibiotic Note  Darren Wood is a 59 y.o. male presented to the ED on 02/13/2020 with COVID-19.  Pharmacy is consulted start unasyn for aspiration pna.  Plan: - unasyn 3gm IV q6h - Pharmacy will sign off for abx consult.  Reconsult [place] if need further assistance.  ______________________________________  Height: 6' (182.9 cm) Weight: 109.3 kg (240 lb 15.4 oz) IBW/kg (Calculated) : 77.6  Temp (24hrs), Avg:98.1 F (36.7 C), Min:97.9 F (36.6 C), Max:98.4 F (36.9 C)  Recent Labs  Lab 02/13/20 1453 02/13/20 1455 02/14/20 0521 02/15/20 0610 02/16/20 0456 02/17/20 0428 02/18/20 0601 02/20/20 0431  WBC 9.1  --   --   --   --   --   --  33.2*  CREATININE 1.09  --    < > 0.99 0.90 0.99 1.04 1.08  LATICACIDVEN  --  1.4  --   --   --   --   --   --    < > = values in this interval not displayed.    Estimated Creatinine Clearance: 91.7 mL/min (by C-G formula based on SCr of 1.08 mg/dL).    No Known Allergies   Thank you for allowing pharmacy to be a part of this patient's care.  Lucia Gaskins 02/20/2020 11:15 AM

## 2020-02-20 NOTE — Progress Notes (Signed)
PROGRESS NOTE  Darren Wood IRS:[id number] DOB: [birth year] DOA: 02/13/2020 PCP: Loyal Jacobson, MD   LOS: 7 days   Brief Narrative / Interim history: 59 year old male with history of obesity, hypertension, hyperlipidemia, came into the hospital with cough, shortness of breath.  He has been having symptoms for the past 10 days, tested positive for Covid on 10/5, but over the last 3 to 4 days prior to admission he has experienced worsening shortness of breath.  He was hypoxic to 72% on room air on admission requiring 15 L nonrebreather.  Subjective / 24h Interval events: Felt lightheaded yesterday when he ambulated.  Had some nausea and one episode of vomiting this morning.  Assessment & Plan: Principal Problem Acute Hypoxic Respiratory Failure due to Covid-19 Viral Illness -Continue supportive treatment, on 15 L and one-point with able to be weaned off, continues to remain on 5 L at rest and requiring more than 6 with activity -Status post 5 days of remdesivir -Continue steroids -Status post Actemra on 10/12 -Wean off oxygen as tolerated, prone if able, incentive spirometry, flutter valve  -Encourage ambulation -Start Unasyn for concern for aspiration pneumonia   COVID-19 Labs  Recent Labs    02/18/20 0601 02/20/20 0431  DDIMER 0.69* 0.95*  FERRITIN 1,066*  --   CRP 0.9 0.5    Lab Results  Component Value Date   SARSCOV2NAA POSITIVE (A) 02/13/2020    Active Problems Nausea, vomiting, probable aspiration pneumonia -Happened on 10/17.  Patient reports he thinks he may have aspirated and had a cough afterwards.  WBC increased to 33K today.  Start empiric Unasyn.  Essential hypertension -Remains normotensive, hold home medications  Obesity -BMI 32, patient would need sleep study as an outpatient, wife reports he snores heavily   Scheduled Meds: . (feeding supplement) PROSource Plus  30 mL Oral BID BM  . enoxaparin (LOVENOX) injection  40 mg Subcutaneous Q24H  . feeding  supplement  237 mL Oral BID BM  . methylPREDNISolone (SOLU-MEDROL) injection  1 mg/kg Intravenous Q12H   Continuous Infusions:  PRN Meds:.acetaminophen, chlorpheniramine-HYDROcodone, guaiFENesin-dextromethorphan, ondansetron **OR** ondansetron (ZOFRAN) IV  DVT prophylaxis: Lovenox Code Status: Full code Family Communication: Travious Vanover, wife, [phone]  Status is: Inpatient  Remains inpatient appropriate because:Inpatient level of care appropriate due to severity of illness  Dispo: The patient is from: Home              Anticipated d/c is to: Home              Anticipated d/c date is: 2 days              Patient currently is not medically stable to d/c.  Consultants:  None   Procedures:  None   Microbiology: none  Antibacterials: None    Objective: Vitals:   02/19/20 0508 02/19/20 1402 02/19/20 2021 02/20/20 0424  BP: 135/84 (!) 159/102 (!) 141/102 (!) 145/107  Pulse: 91 95 98 91  Resp: (!) 24 20 (!) 24 (!) 24  Temp: 97.8 F (36.6 C) 98.4 F (36.9 C) 97.9 F (36.6 C) 97.9 F (36.6 C)  TempSrc: Oral  Oral Oral  SpO2: 97% (!) 83% (!) 89% 95%  Weight:      Height:        Intake/Output Summary (Last 24 hours) at 02/20/2020 1113 Last data filed at 02/20/2020 0903 Gross per 24 hour  Intake 0 ml  Output 1650 ml  Net -1650 ml   Filed Weights   02/13/20 1622 02/13/20 2017  Weight: 110.2 kg 109.3 kg    Examination:  Constitutional: In bed, no distress Eyes: No icterus ENMT: mmm Neck: normal, supple Respiratory: Diminished at the bases, no wheezing, no crackles Cardiovascular: Regular rate and rhythm, no murmurs appreciated.  No peripheral edema Abdomen: Soft, nontender, nondistended, bowel sounds positive Musculoskeletal: no clubbing / cyanosis.  Skin: No rashes seen Neurologic: No focal deficits  Data Reviewed: I have independently reviewed following labs and imaging studies   CBC: Recent Labs  Lab 02/13/20 1453 02/20/20 0431  WBC 9.1 33.2*    NEUTROABS 6.6  --   HGB 14.2 16.3  HCT 43.3 48.3  MCV 87.7 87.0  PLT 256 457*   Basic Metabolic Panel: Recent Labs  Lab 02/15/20 0610 02/16/20 0456 02/17/20 0428 02/18/20 0601 02/20/20 0431  NA 136 140 143 146* 140  K 4.5 4.4 4.6 4.5 4.8  CL 103 104 106 105 103  CO2 22 22 23 25 24   GLUCOSE 154* 158* 173* 169* 201*  BUN 42* 41* 39* 45* 40*  CREATININE 0.99 0.90 0.99 1.04 1.08  CALCIUM 8.4* 8.8* 8.9 9.4 8.9  MG  --   --   --   --  2.5*   GFR: Estimated Creatinine Clearance: 91.7 mL/min (by C-G formula based on SCr of 1.08 mg/dL). Liver Function Tests: Recent Labs  Lab 02/15/20 0610 02/16/20 0456 02/17/20 0428 02/18/20 0601 02/20/20 0431  AST 52* 54* 51* 38 31  ALT 44 60* 72* 67* 47*  ALKPHOS 53 49 55 61 63  BILITOT 0.5 0.8 0.7 0.7 0.8  PROT 6.9 7.1 6.6 7.1 6.8  ALBUMIN 3.1* 3.2* 3.2* 3.4* 3.4*   No results for input(s): LIPASE, AMYLASE in the last 168 hours. No results for input(s): AMMONIA in the last 168 hours. Coagulation Profile: Recent Labs  Lab 02/13/20 1453  INR 1.1   Cardiac Enzymes: No results for input(s): CKTOTAL, CKMB, CKMBINDEX, TROPONINI in the last 168 hours. BNP (last 3 results) No results for input(s): PROBNP in the last 8760 hours. HbA1C: No results for input(s): HGBA1C in the last 72 hours. CBG: No results for input(s): GLUCAP in the last 168 hours. Lipid Profile: No results for input(s): CHOL, HDL, LDLCALC, TRIG, CHOLHDL, LDLDIRECT in the last 72 hours. Thyroid Function Tests: No results for input(s): TSH, T4TOTAL, FREET4, T3FREE, THYROIDAB in the last 72 hours. Anemia Panel: Recent Labs    02/18/20 0601  FERRITIN 1,066*   Urine analysis:    Component Value Date/Time   COLORURINE YELLOW 02/13/2020 1800   APPEARANCEUR CLEAR 02/13/2020 1800   LABSPEC 1.029 02/13/2020 1800   PHURINE 5.0 02/13/2020 1800   GLUCOSEU NEGATIVE 02/13/2020 1800   HGBUR NEGATIVE 02/13/2020 1800   BILIRUBINUR NEGATIVE 02/13/2020 1800   KETONESUR  NEGATIVE 02/13/2020 1800   PROTEINUR NEGATIVE 02/13/2020 1800   NITRITE NEGATIVE 02/13/2020 1800   LEUKOCYTESUR NEGATIVE 02/13/2020 1800   Sepsis Labs: Invalid input(s): PROCALCITONIN, LACTICIDVEN  Recent Results (from the past 240 hour(s))  Culture, blood (Routine x 2)     Status: None   Collection Time: 02/13/20  2:53 PM   Specimen: BLOOD  Result Value Ref Range Status   Specimen Description   Final    BLOOD LEFT ANTECUBITAL Performed at [facility], [address] [place], [place]    Special Requests   Final    BOTTLES DRAWN AEROBIC AND ANAEROBIC Blood Culture adequate volume Performed at [facility], [address], [place], [place]  Culture   Final    NO GROWTH 5 DAYS Performed at [facility], [address] [place], [place] [address]    Report Status 02/18/2020 FINAL  Final  Respiratory Panel by RT PCR (Flu A&B, Covid) - Nasopharyngeal Swab     Status: Abnormal   Collection Time: 02/13/20  4:16 PM   Specimen: Nasopharyngeal Swab  Result Value Ref Range Status   SARS Coronavirus 2 by RT PCR POSITIVE (A) NEGATIVE Final    Comment: RESULT CALLED TO, READ BACK BY AND VERIFIED WITH: ZULETTA,K RN @1731  ON 02/13/20 JACKSON,K (NOTE) SARS-CoV-2 target nucleic acids are DETECTED.  SARS-CoV-2 RNA is generally detectable in upper respiratory specimens  during the acute phase of infection. Positive results are indicative of the presence of the identified virus, but do not rule out bacterial infection or co-infection with other pathogens not detected by the test. Clinical correlation with patient history and other diagnostic information is necessary to determine patient infection status. The expected result is Negative.  Fact Sheet for Patients:  https://www.moore.com/https://www.fda.gov/media/142436/download  Fact Sheet for Healthcare Providers: https://www.young.biz/https://www.fda.gov/media/142435/download  This test is not yet approved or  cleared by the [place] FDA and  has been authorized for detection and/or diagnosis of SARS-CoV-2 by FDA under an Emergency Use Authorization (EUA).  This EUA will remain in effect (meaning this test c an be used) for the duration of  the COVID-19 declaration under Section 564(b)(1) of the Act, 21 U.S.C. section 360bbb-3(b)(1), unless the authorization is terminated or revoked sooner.      Influenza A by PCR NEGATIVE NEGATIVE Final   Influenza B by PCR NEGATIVE NEGATIVE Final    Comment: (NOTE) The Xpert Xpress SARS-CoV-2/FLU/RSV assay is intended as an aid in  the diagnosis of influenza from Nasopharyngeal swab specimens and  should not be used as a sole basis for treatment. Nasal washings and  aspirates are unacceptable for Xpert Xpress SARS-CoV-2/FLU/RSV  testing.  Fact Sheet for Patients: https://www.moore.com/https://www.fda.gov/media/142436/download  Fact Sheet for Healthcare Providers: https://www.young.biz/https://www.fda.gov/media/142435/download  This test is not yet approved or cleared by the [place] FDA and  has been authorized for detection and/or diagnosis of SARS-CoV-2 by  FDA under an Emergency Use Authorization (EUA). This EUA will remain  in effect (meaning this test can be used) for the duration of the  Covid-19 declaration under Section 564(b)(1) of the Act, 21  U.S.C. section 360bbb-3(b)(1), unless the authorization is  terminated or revoked. Performed at [facility], [address] [place], [place] [address]   Culture, blood (Routine x 2)     Status: None   Collection Time: 02/13/20  4:30 PM   Specimen: BLOOD  Result Value Ref Range Status   Specimen Description   Final    BLOOD RIGHT ARM Performed at [facility], [address] [place], [place] [address]    Special Requests   Final    BOTTLES DRAWN AEROBIC AND ANAEROBIC Blood Culture results may not be optimal due to an inadequate volume of blood received in culture bottles Performed at  [facility], [address] [place], [place] [address]    Culture   Final    NO GROWTH 5 DAYS Performed at [facility], [address] [place], [place] [address]    Report Status 02/18/2020 FINAL  Final      Radiology Studies: DG CHEST PORT 1 VIEW  Result Date: 02/20/2020 CLINICAL DATA:  Hypoxia.  COVID positive EXAM: PORTABLE CHEST  1 VIEW COMPARISON:  02/13/2020 FINDINGS: Stable mild bilateral infiltrate and low lung volumes. No visible effusion or pneumothorax. Stable heart size. IMPRESSION: Stable low volume chest with mild infiltrates. Electronically Signed   By: Marnee Spring M.D.   On: 02/20/2020 07:57    Pamella Pert, MD, PhD [facility]  Between 7 am - 7 pm I am available, please contact me via Amion or Securechat  Between 7 pm - 7 am I am not available, please contact night coverage MD/APP via Amion

## 2020-02-20 NOTE — Progress Notes (Addendum)
Patient seems more tired today.  Pt educated on the benefit of movement and encouraged to prone/get up into chair for meals. Will continue to provide supportive education to encourage motivation and mobility.

## 2020-02-21 DIAGNOSIS — Z96642 Presence of left artificial hip joint: Secondary | ICD-10-CM | POA: Diagnosis not present

## 2020-02-21 DIAGNOSIS — U071 COVID-19: Secondary | ICD-10-CM | POA: Diagnosis not present

## 2020-02-21 DIAGNOSIS — J9601 Acute respiratory failure with hypoxia: Secondary | ICD-10-CM | POA: Diagnosis not present

## 2020-02-21 DIAGNOSIS — I10 Essential (primary) hypertension: Secondary | ICD-10-CM | POA: Diagnosis not present

## 2020-02-21 LAB — CBC
HCT: 48.7 % (ref 39.0–52.0)
Hemoglobin: 16.4 g/dL (ref 13.0–17.0)
MCH: 29.6 pg (ref 26.0–34.0)
MCHC: 33.7 g/dL (ref 30.0–36.0)
MCV: 87.9 fL (ref 80.0–100.0)
Platelets: 432 10*3/uL — ABNORMAL HIGH (ref 150–400)
RBC: 5.54 MIL/uL (ref 4.22–5.81)
RDW: 14.8 % (ref 11.5–15.5)
WBC: 39 10*3/uL — ABNORMAL HIGH (ref 4.0–10.5)
nRBC: 0 % (ref 0.0–0.2)

## 2020-02-21 LAB — COMPREHENSIVE METABOLIC PANEL
ALT: 59 U/L — ABNORMAL HIGH (ref 0–44)
AST: 29 U/L (ref 15–41)
Albumin: 3.3 g/dL — ABNORMAL LOW (ref 3.5–5.0)
Alkaline Phosphatase: 56 U/L (ref 38–126)
Anion gap: 12 (ref 5–15)
BUN: 38 mg/dL — ABNORMAL HIGH (ref 8–23)
CO2: 23 mmol/L (ref 22–32)
Calcium: 8.9 mg/dL (ref 8.9–10.3)
Chloride: 105 mmol/L (ref 98–111)
Creatinine, Ser: 0.97 mg/dL (ref 0.61–1.24)
GFR, Estimated: >60 mL/min (ref >60)
Glucose, Bld: 159 mg/dL — ABNORMAL HIGH (ref 70–99)
Potassium: 4.7 mmol/L (ref 3.5–5.1)
Sodium: 140 mmol/L (ref 135–145)
Total Bilirubin: 1.2 mg/dL (ref 0.3–1.2)
Total Protein: 6.6 g/dL (ref 6.5–8.1)

## 2020-02-21 LAB — D-DIMER, QUANTITATIVE: D-Dimer, Quant: 0.84 ug/mL-FEU — ABNORMAL HIGH (ref 0.00–0.50)

## 2020-02-21 LAB — PROCALCITONIN: Procalcitonin: <0.1 ng/mL

## 2020-02-21 MED ORDER — HYDRALAZINE HCL 20 MG/ML IJ SOLN
5.0000 mg | Freq: Once | INTRAMUSCULAR | Status: AC
  Administered 2020-02-21: 5 mg via INTRAVENOUS
  Filled 2020-02-21: qty 1

## 2020-02-21 MED ORDER — LOSARTAN POTASSIUM 25 MG PO TABS
25.0000 mg | ORAL_TABLET | Freq: Every day | ORAL | Status: DC
  Administered 2020-02-21 – 2020-02-26 (×6): 25 mg via ORAL
  Filled 2020-02-21 (×6): qty 1

## 2020-02-21 NOTE — Progress Notes (Signed)
Nutrition Follow-up  DOCUMENTATION CODES:   Obesity unspecified  INTERVENTION:   -Ensure Enlive po BID, each supplement provides 350 kcal and 20 grams of protein (may be replaced by Ensure Plus as Ensure Enlive on backorder)  -Prosource Plus PO BID, each provides 100 kcals and 15g protein  NUTRITION DIAGNOSIS:   Increased nutrient needs related to acute illness, catabolic illness (COVID-19 infection) as evidenced by estimated needs.  GOAL:   Patient will meet greater than or equal to 90% of their needs  MONITOR:   PO intake, Supplement acceptance, Labs, Weight trends  ASSESSMENT:   59 y.o. male with medical history of HTN, obesity, hypercholesterolemia, and DJD. He tested positive for COVID-19 on 02/07/20. He presented to the ED from home via EMS due to fatigue, body aches, malaise, SOB, mild cough, and fever. He was also having intermittent mild nausea and a few episodes of non-bloody loose stools.  COVID-19 + since 10/11.  PO intakes have been variable, 0-100% of meal completions over the past few days. Pt was having N/V on 10/17.  Pt has been drinking supplements, Ensure mostly and taking at least 1 Prosource daily.  Admission weight: 240 lbs. No new weights for admission.  Medications reviewed. Labs reviewed: Elevated Mg  Diet Order:   Diet Order            Diet 2 gram sodium Room service appropriate? Yes; Fluid consistency: Thin  Diet effective now                 EDUCATION NEEDS:   No education needs have been identified at this time  Skin:  Skin Assessment: Reviewed RN Assessment  Last BM:  PTA/unknown  Height:   Ht Readings from Last 1 Encounters:  02/13/20 6' (1.829 m)    Weight:   Wt Readings from Last 1 Encounters:  02/13/20 109.3 kg   BMI:  Body mass index is 32.68 kg/m.  Estimated Nutritional Needs:   Kcal:  2100-2300 kcal  Protein:  110-120 grams  Fluid:  >/= 2.2 L/day  Tilda Franco, MS, RD, LDN Inpatient Clinical  Dietitian Contact information available via Amion

## 2020-02-21 NOTE — Progress Notes (Signed)
Patient walked 126ft on 8L O2 via [place], patient desats to 84% while ambulating. Patient is in reclining chair 90% on 5L via [place]. Will try to walk patient again later this afternoon. Also, encouraging PO fluid intake, lab was unable to complete blood work. MD aware.

## 2020-02-21 NOTE — Progress Notes (Signed)
PROGRESS NOTE  Aram CandelaMaurice Oberhaus WUJ:[id number] DOB: 03/17/1961 DOA: 02/13/2020 PCP: Loyal JacobsonKalish, Michael, MD   LOS: 8 days   Brief Narrative / Interim history: 59 year old male with history of obesity, hypertension, hyperlipidemia, came into the hospital with cough, shortness of breath.  He has been having symptoms for the past 10 days, tested positive for Covid on 10/5, but over the last 3 to 4 days prior to admission he has experienced worsening shortness of breath.  He was hypoxic to 72% on room air on admission requiring 15 L nonrebreather.  Subjective / 24h Interval events: On his abdomen when I entered the room, denies any shortness of breath and states that he feels well.  Assessment & Plan: Principal Problem Acute Hypoxic Respiratory Failure due to Covid-19 Viral Illness -Continue supportive treatment, on 15 L and one-point with able to be weaned off, he continues to remain on 5 L at rest -Status post 5 days of remdesivir, continue steroids, status post Actemra 10/12 -Wean off oxygen as tolerated, prone if able, incentive spirometry, flutter valve  -Encourage ambulation -On Unasyn for concern for aspiration pneumonia since 10/18   COVID-19 Labs  Recent Labs    02/20/20 0431 02/21/20 1043  DDIMER 0.95* 0.84*  CRP 0.5  --     Lab Results  Component Value Date   SARSCOV2NAA POSITIVE (A) 02/13/2020    Active Problems Nausea, vomiting, probable aspiration pneumonia, elevated WBC -Happened on 10/17.  Patient reports he thinks he may have aspirated and had a cough afterwards.  -White count continues to go up today, obtain procalcitonin.  Likely that steroids are contributing also.  He is afebrile.  Continue to monitor clinically  Essential hypertension -Remains normotensive, hold home medications  Obesity -BMI 32, patient would need sleep study as an outpatient, wife reports he snores heavily   Scheduled Meds: . (feeding supplement) PROSource Plus  30 mL Oral BID BM  .  enoxaparin (LOVENOX) injection  40 mg Subcutaneous Q24H  . feeding supplement  237 mL Oral BID BM  . methylPREDNISolone (SOLU-MEDROL) injection  1 mg/kg Intravenous Q12H   Continuous Infusions: . ampicillin-sulbactam (UNASYN) IV 3 g (02/21/20 0628)   PRN Meds:.acetaminophen, chlorpheniramine-HYDROcodone, guaiFENesin-dextromethorphan, ondansetron **OR** ondansetron (ZOFRAN) IV  DVT prophylaxis: Lovenox Code Status: Full code Family Communication: Carlene CoriaDeidre Maffei, wife, [phone]  Status is: Inpatient  Remains inpatient appropriate because:Inpatient level of care appropriate due to severity of illness  Dispo: The patient is from: Home              Anticipated d/c is to: Home              Anticipated d/c date is: 2 days              Patient currently is not medically stable to d/c.  Consultants:  None   Procedures:  None   Microbiology: none  Antibacterials: None    Objective: Vitals:   02/20/20 1403 02/20/20 1936 02/21/20 0332 02/21/20 0358  BP: 135/90 (!) 136/91 (!) 157/145 (!) 145/78  Pulse: 94 (!) 103 91 87  Resp: 20 18 (!) 21 (!) 21  Temp: 98.4 F (36.9 C) 98.3 F (36.8 C) 98.2 F (36.8 C) 98.8 F (37.1 C)  TempSrc: Oral Oral Oral Oral  SpO2: 91% 95% (!) 86% 90%  Weight:      Height:        Intake/Output Summary (Last 24 hours) at 02/21/2020 1124 Last data filed at 02/20/2020 2153 Gross per 24 hour  Intake 390 ml  Output 1100 ml  Net -710 ml   Filed Weights   02/13/20 1622 02/13/20 2017  Weight: 110.2 kg 109.3 kg    Examination:  Constitutional: No distress, in bed, prone ENMT: Moist mucous membranes Neck: normal, supple Respiratory: Diminished at the bases, no crackles, no wheezing Cardiovascular: Regular rate and rhythm, no murmurs, no edema Abdomen: Soft, NT, ND, bowel sounds positive Musculoskeletal: no clubbing / cyanosis.  Skin: No rashes appreciated Neurologic: Nonfocal, equal strength  Data Reviewed: I have independently reviewed  following labs and imaging studies   CBC: Recent Labs  Lab 02/20/20 0431 02/21/20 1043  WBC 33.2* 39.0*  HGB 16.3 16.4  HCT 48.3 48.7  MCV 87.0 87.9  PLT 457* 432*   Basic Metabolic Panel: Recent Labs  Lab 02/16/20 0456 02/17/20 0428 02/18/20 0601 02/20/20 0431 02/21/20 1043  NA 140 143 146* 140 140  K 4.4 4.6 4.5 4.8 4.7  CL 104 106 105 103 105  CO2 22 23 25 24 23   GLUCOSE 158* 173* 169* 201* 159*  BUN 41* 39* 45* 40* 38*  CREATININE 0.90 0.99 1.04 1.08 0.97  CALCIUM 8.8* 8.9 9.4 8.9 8.9  MG  --   --   --  2.5*  --    GFR: Estimated Creatinine Clearance: 102.1 mL/min (by C-G formula based on SCr of 0.97 mg/dL). Liver Function Tests: Recent Labs  Lab 02/16/20 0456 02/17/20 0428 02/18/20 0601 02/20/20 0431 02/21/20 1043  AST 54* 51* 38 31 29  ALT 60* 72* 67* 47* 59*  ALKPHOS 49 55 61 63 56  BILITOT 0.8 0.7 0.7 0.8 1.2  PROT 7.1 6.6 7.1 6.8 6.6  ALBUMIN 3.2* 3.2* 3.4* 3.4* 3.3*   No results for input(s): LIPASE, AMYLASE in the last 168 hours. No results for input(s): AMMONIA in the last 168 hours. Coagulation Profile: No results for input(s): INR, PROTIME in the last 168 hours. Cardiac Enzymes: No results for input(s): CKTOTAL, CKMB, CKMBINDEX, TROPONINI in the last 168 hours. BNP (last 3 results) No results for input(s): PROBNP in the last 8760 hours. HbA1C: No results for input(s): HGBA1C in the last 72 hours. CBG: No results for input(s): GLUCAP in the last 168 hours. Lipid Profile: No results for input(s): CHOL, HDL, LDLCALC, TRIG, CHOLHDL, LDLDIRECT in the last 72 hours. Thyroid Function Tests: No results for input(s): TSH, T4TOTAL, FREET4, T3FREE, THYROIDAB in the last 72 hours. Anemia Panel: No results for input(s): VITAMINB12, FOLATE, FERRITIN, TIBC, IRON, RETICCTPCT in the last 72 hours. Urine analysis:    Component Value Date/Time   COLORURINE YELLOW 02/13/2020 1800   APPEARANCEUR CLEAR 02/13/2020 1800   LABSPEC 1.029 02/13/2020 1800    PHURINE 5.0 02/13/2020 1800   GLUCOSEU NEGATIVE 02/13/2020 1800   HGBUR NEGATIVE 02/13/2020 1800   BILIRUBINUR NEGATIVE 02/13/2020 1800   KETONESUR NEGATIVE 02/13/2020 1800   PROTEINUR NEGATIVE 02/13/2020 1800   NITRITE NEGATIVE 02/13/2020 1800   LEUKOCYTESUR NEGATIVE 02/13/2020 1800   Sepsis Labs: Invalid input(s): PROCALCITONIN, LACTICIDVEN  Recent Results (from the past 240 hour(s))  Culture, blood (Routine x 2)     Status: None   Collection Time: 02/13/20  2:53 PM   Specimen: BLOOD  Result Value Ref Range Status   Specimen Description   Final    BLOOD LEFT ANTECUBITAL Performed at [facility], [address], [place], [place]    Special Requests   Final    BOTTLES DRAWN AEROBIC AND ANAEROBIC Blood Culture adequate volume Performed at [facility], [address], [place], [place] [address]    Culture   Final    NO GROWTH 5 DAYS Performed at [facility], [address] [place], [place] [address]    Report Status 02/18/2020 FINAL  Final  Respiratory Panel by RT PCR (Flu A&B, Covid) - Nasopharyngeal Swab     Status: Abnormal   Collection Time: 02/13/20  4:16 PM   Specimen: Nasopharyngeal Swab  Result Value Ref Range Status   SARS Coronavirus 2 by RT PCR POSITIVE (A) NEGATIVE Final    Comment: RESULT CALLED TO, READ BACK BY AND VERIFIED WITH: ZULETTA,K RN @1731  ON 02/13/20 JACKSON,K (NOTE) SARS-CoV-2 target nucleic acids are DETECTED.  SARS-CoV-2 RNA is generally detectable in upper respiratory specimens  during the acute phase of infection. Positive results are indicative of the presence of the identified virus, but do not rule out bacterial infection or co-infection with other pathogens not detected by the test. Clinical correlation with patient history and other diagnostic information is necessary to determine patient infection status. The expected result is Negative.  Fact Sheet for Patients:    04/14/20  Fact Sheet for Healthcare Providers: https://www.moore.com/  This test is not yet approved or cleared by the https://www.young.biz/ FDA and  has been authorized for detection and/or diagnosis of SARS-CoV-2 by FDA under an Emergency Use Authorization (EUA).  This EUA will remain in effect (meaning this test c an be used) for the duration of  the COVID-19 declaration under Section 564(b)(1) of the Act, 21 U.S.C. section 360bbb-3(b)(1), unless the authorization is terminated or revoked sooner.      Influenza A by PCR NEGATIVE NEGATIVE Final   Influenza B by PCR NEGATIVE NEGATIVE Final    Comment: (NOTE) The Xpert Xpress SARS-CoV-2/FLU/RSV assay is intended as an aid in  the diagnosis of influenza from Nasopharyngeal swab specimens and  should not be used as a sole basis for treatment. Nasal washings and  aspirates are unacceptable for Xpert Xpress SARS-CoV-2/FLU/RSV  testing.  Fact Sheet for Patients: [place]  Fact Sheet for Healthcare Providers: https://www.moore.com/  This test is not yet approved or cleared by the https://www.young.biz/ FDA and  has been authorized for detection and/or diagnosis of SARS-CoV-2 by  FDA under an Emergency Use Authorization (EUA). This EUA will remain  in effect (meaning this test can be used) for the duration of the  Covid-19 declaration under Section 564(b)(1) of the Act, 21  U.S.C. section 360bbb-3(b)(1), unless the authorization is  terminated or revoked. Performed at [facility], [address], [place], [place]   Culture, blood (Routine x 2)     Status: None   Collection Time: 02/13/20  4:30 PM   Specimen: BLOOD  Result Value Ref Range Status   Specimen Description   Final    BLOOD RIGHT ARM Performed at [facility], [address] [place], [place]    Special Requests   Final     BOTTLES DRAWN AEROBIC AND ANAEROBIC Blood Culture results may not be optimal due to an inadequate volume of blood received in culture bottles Performed at [facility], [address], [place], [place]    Culture   Final    NO GROWTH 5 DAYS Performed at [facility], [address], [place], [place]    Report Status 02/18/2020 FINAL  Final      Radiology Studies: DG CHEST PORT 1 VIEW  Result  Date: 02/20/2020 CLINICAL DATA:  Hypoxia.  COVID positive EXAM: PORTABLE CHEST 1 VIEW COMPARISON:  02/13/2020 FINDINGS: Stable mild bilateral infiltrate and low lung volumes. No visible effusion or pneumothorax. Stable heart size. IMPRESSION: Stable low volume chest with mild infiltrates. Electronically Signed   By: Marnee Spring M.D.   On: 02/20/2020 07:57    Pamella Pert, MD, PhD [facility]  Between 7 am - 7 pm I am available, please contact me via Amion or Securechat  Between 7 pm - 7 am I am not available, please contact night coverage MD/APP via Amion

## 2020-02-21 NOTE — Progress Notes (Signed)
Secure message send to oncall provider regarding elevated BP trends.

## 2020-02-22 DIAGNOSIS — I10 Essential (primary) hypertension: Secondary | ICD-10-CM | POA: Diagnosis not present

## 2020-02-22 DIAGNOSIS — J9601 Acute respiratory failure with hypoxia: Secondary | ICD-10-CM | POA: Diagnosis not present

## 2020-02-22 DIAGNOSIS — E7800 Pure hypercholesterolemia, unspecified: Secondary | ICD-10-CM

## 2020-02-22 DIAGNOSIS — U071 COVID-19: Secondary | ICD-10-CM | POA: Diagnosis not present

## 2020-02-22 DIAGNOSIS — Z96642 Presence of left artificial hip joint: Secondary | ICD-10-CM | POA: Diagnosis not present

## 2020-02-22 LAB — CBC
HCT: 46 % (ref 39.0–52.0)
Hemoglobin: 15.2 g/dL (ref 13.0–17.0)
MCH: 29.8 pg (ref 26.0–34.0)
MCHC: 33 g/dL (ref 30.0–36.0)
MCV: 90.2 fL (ref 80.0–100.0)
Platelets: 354 10*3/uL (ref 150–400)
RBC: 5.1 MIL/uL (ref 4.22–5.81)
RDW: 14.9 % (ref 11.5–15.5)
WBC: 32.6 10*3/uL — ABNORMAL HIGH (ref 4.0–10.5)
nRBC: 0 % (ref 0.0–0.2)

## 2020-02-22 LAB — D-DIMER, QUANTITATIVE: D-Dimer, Quant: 0.56 ug/mL-FEU — ABNORMAL HIGH (ref 0.00–0.50)

## 2020-02-22 LAB — PROCALCITONIN: Procalcitonin: <0.1 ng/mL

## 2020-02-22 LAB — COMPREHENSIVE METABOLIC PANEL
ALT: 65 U/L — ABNORMAL HIGH (ref 0–44)
AST: 29 U/L (ref 15–41)
Albumin: 3.1 g/dL — ABNORMAL LOW (ref 3.5–5.0)
Alkaline Phosphatase: 56 U/L (ref 38–126)
Anion gap: 13 (ref 5–15)
BUN: 35 mg/dL — ABNORMAL HIGH (ref 8–23)
CO2: 20 mmol/L — ABNORMAL LOW (ref 22–32)
Calcium: 8.7 mg/dL — ABNORMAL LOW (ref 8.9–10.3)
Chloride: 104 mmol/L (ref 98–111)
Creatinine, Ser: 0.89 mg/dL (ref 0.61–1.24)
GFR, Estimated: >60 mL/min (ref >60)
Glucose, Bld: 204 mg/dL — ABNORMAL HIGH (ref 70–99)
Potassium: 4.6 mmol/L (ref 3.5–5.1)
Sodium: 137 mmol/L (ref 135–145)
Total Bilirubin: 1 mg/dL (ref 0.3–1.2)
Total Protein: 6.3 g/dL — ABNORMAL LOW (ref 6.5–8.1)

## 2020-02-22 LAB — C-REACTIVE PROTEIN: CRP: 0.9 mg/dL (ref <1.0)

## 2020-02-22 MED ORDER — NYSTATIN 100000 UNIT/ML MT SUSP
5.0000 mL | Freq: Four times a day (QID) | OROMUCOSAL | Status: DC
  Administered 2020-02-22 – 2020-02-26 (×16): 500000 [IU] via OROMUCOSAL
  Filled 2020-02-22 (×16): qty 5

## 2020-02-22 MED ORDER — METHYLPREDNISOLONE SODIUM SUCC 125 MG IJ SOLR
50.0000 mg | Freq: Every day | INTRAMUSCULAR | Status: DC
  Administered 2020-02-23 – 2020-02-26 (×4): 50 mg via INTRAVENOUS
  Filled 2020-02-22 (×4): qty 2

## 2020-02-22 MED ORDER — HYDROCOD POLST-CPM POLST ER 10-8 MG/5ML PO SUER
5.0000 mL | Freq: Two times a day (BID) | ORAL | Status: DC
  Administered 2020-02-22 – 2020-02-26 (×8): 5 mL via ORAL
  Filled 2020-02-22 (×8): qty 5

## 2020-02-22 NOTE — Progress Notes (Signed)
PROGRESS NOTE    Ercell Razon  OBS:[id number] DOB: [birth year] DOA: 02/13/2020 PCP: Loyal Jacobson, MD    Brief Narrative:  Mr. Skidmore was admitted to the hospital working diagnosis of acute hypoxic respiratory failure due to SARS COVID-19 viral pneumonia.  59 year old male with past medical history of hypertension, obesity class II, dyslipidemia and degenerative joint disease.  Patient tested positive for COVID-19 02/07/2020, symptoms consistent with fatigue, body aches, malaise, fever, cough and dyspnea.  Due to worsening symptoms he presented to the hospital for further evaluation.  On his initial physical examination his oximetry was 72 to 83% on room air, his respiratory rate was 35-47, heart rate 90, blood pressure 108/67, his lungs were clear to auscultation bilaterally, heart S1-S2, present rhythmic, soft abdomen, no lower extremity edema.   His chest radiograph had bilateral interstitial infiltrates peripherally at lower lobes.  Patient received submental oxygen per nasal cannula, medical therapy with remdesivir, systemic corticosteroids and Tocilizumab.  1.  Acute hypoxic respiratory failure due to SARS COVID-19 viral pneumonia. SP remdesivir #5/5, Tocilizumab #1 (10/12).   RR: 20  Pulse oxymetry: 96%  Fi02: 8 L/ HFNC  COVID-19 Labs  Recent Labs    02/20/20 0431 02/21/20 1043 02/22/20 0404  DDIMER 0.95* 0.84* 0.56*  CRP 0.5  --  0.9    Lab Results  Component Value Date   SARSCOV2NAA POSITIVE (A) 02/13/2020    Inflammatory markers and dyspnea slowly improving, he is out of bed to chair and doing airway clearing techniques.  Follow up chest film 10/18 personally reviewed with no significant change in bilateral lower lobes infiltrates.   Continue medical therapy with systemic corticosteroids (decrease dose to 50 mg daily), bronchodilators and antitussive agents.   2. Aspiration pneumonia/ pneumonitis. On Unasyn #3/5, wbc is trending down. Continue with aspiration  precautions. Pro-calcitonin is low.  Discontinue antibiotic therapy for now and continue close monitoring. Possible reactive leukocytosis.   3. HTN. Continue blood pressure control with losartan. Systolic blood pressure 145 to 165 mmHg.   4. Oral thrush. White plaque under the tongue, add nystatin.    Assessment & Plan:   Principal Problem:   Pneumonia due to COVID-19 virus Active Problems:   Essential hypertension   History of total hip arthroplasty, left   Obesity (BMI 30-39.9)   Pure hypercholesterolemia   Acute respiratory failure with hypoxia (HCC)    Patient continue to be at high risk for worsening respiratory failure   Status is: Inpatient  Remains inpatient appropriate because:IV treatments appropriate due to intensity of illness or inability to take PO   Dispo: The patient is from: Home              Anticipated d/c is to: Home              Anticipated d/c date is: 3 days              Patient currently is not medically stable to d/c.   DVT prophylaxis: Enoxaparin   Code Status:   full  Family Communication:  I spoke over the phone with the patient's wife about patient's  condition, plan of care, prognosis and all questions were addressed.    Nutrition Status: Nutrition Problem: Increased nutrient needs Etiology: acute illness, catabolic illness (COVID-19 infection) Signs/Symptoms: estimated needs Interventions: Ensure Enlive (each supplement provides 350kcal and 20 grams of protein), Other (Comment) (Prosource Plus)     Subjective: Patient with improvement in dyspnea but not yet back to baseline, continue to have worsening  dyspnea on exertion, no nausea or vomiting. No chest pain.   Objective: Vitals:   02/21/20 0358 02/21/20 1335 02/21/20 2010 02/22/20 0411  BP: (!) 145/78 (!) 160/105 (!) 156/114 139/83  Pulse: 87 (!) 110 98 93  Resp: (!) 21 20 20 20   Temp: 98.8 F (37.1 C) 97.6 F (36.4 C) 98.1 F (36.7 C) 98.1 F (36.7 C)  TempSrc: Oral Oral      SpO2: 90% (!) 89% 95% 96%  Weight:      Height:        Intake/Output Summary (Last 24 hours) at 02/22/2020 1119 Last data filed at 02/22/2020 0440 Gross per 24 hour  Intake 240 ml  Output 450 ml  Net -210 ml   Filed Weights   02/13/20 1622 02/13/20 2017  Weight: 110.2 kg 109.3 kg    Examination:   General: Not in pain or dyspnea, deconditioned  Neurology: Awake and alert, non focal  E ENT: mild pallor, no icterus, oral mucosa moist Cardiovascular: No JVD. S1-S2 present, rhythmic, no gallops, rubs, or murmurs. No lower extremity edema. Pulmonary: positive breath sounds bilaterally, no wheezing, rhonchi or rales. Gastrointestinal. Abdomen soft and non tender Skin. No rashes Musculoskeletal: no joint deformities     Data Reviewed: I have personally reviewed following labs and imaging studies  CBC: Recent Labs  Lab 02/20/20 0431 02/21/20 1043 02/22/20 0404  WBC 33.2* 39.0* 32.6*  HGB 16.3 16.4 15.2  HCT 48.3 48.7 46.0  MCV 87.0 87.9 90.2  PLT 457* 432* 354   Basic Metabolic Panel: Recent Labs  Lab 02/17/20 0428 02/18/20 0601 02/20/20 0431 02/21/20 1043 02/22/20 0404  NA 143 146* 140 140 137  K 4.6 4.5 4.8 4.7 4.6  CL 106 105 103 105 104  CO2 23 25 24 23  20*  GLUCOSE 173* 169* 201* 159* 204*  BUN 39* 45* 40* 38* 35*  CREATININE 0.99 1.04 1.08 0.97 0.89  CALCIUM 8.9 9.4 8.9 8.9 8.7*  MG  --   --  2.5*  --   --    GFR: Estimated Creatinine Clearance: 111.3 mL/min (by C-G formula based on SCr of 0.89 mg/dL). Liver Function Tests: Recent Labs  Lab 02/17/20 0428 02/18/20 0601 02/20/20 0431 02/21/20 1043 02/22/20 0404  AST 51* 38 31 29 29   ALT 72* 67* 47* 59* 65*  ALKPHOS 55 61 63 56 56  BILITOT 0.7 0.7 0.8 1.2 1.0  PROT 6.6 7.1 6.8 6.6 6.3*  ALBUMIN 3.2* 3.4* 3.4* 3.3* 3.1*   No results for input(s): LIPASE, AMYLASE in the last 168 hours. No results for input(s): AMMONIA in the last 168 hours. Coagulation Profile: No results for input(s):  INR, PROTIME in the last 168 hours. Cardiac Enzymes: No results for input(s): CKTOTAL, CKMB, CKMBINDEX, TROPONINI in the last 168 hours. BNP (last 3 results) No results for input(s): PROBNP in the last 8760 hours. HbA1C: No results for input(s): HGBA1C in the last 72 hours. CBG: No results for input(s): GLUCAP in the last 168 hours. Lipid Profile: No results for input(s): CHOL, HDL, LDLCALC, TRIG, CHOLHDL, LDLDIRECT in the last 72 hours. Thyroid Function Tests: No results for input(s): TSH, T4TOTAL, FREET4, T3FREE, THYROIDAB in the last 72 hours. Anemia Panel: No results for input(s): VITAMINB12, FOLATE, FERRITIN, TIBC, IRON, RETICCTPCT in the last 72 hours.    Radiology Studies: I have reviewed all of the imaging during this hospital visit personally     Scheduled Meds: . (feeding supplement) PROSource Plus  30 mL Oral BID BM  .  enoxaparin (LOVENOX) injection  40 mg Subcutaneous Q24H  . feeding supplement  237 mL Oral BID BM  . losartan  25 mg Oral Daily  . methylPREDNISolone (SOLU-MEDROL) injection  1 mg/kg Intravenous Q12H   Continuous Infusions: . ampicillin-sulbactam (UNASYN) IV 3 g (02/22/20 0547)     LOS: 9 days        Willadene Mounsey Annett Gula, MD

## 2020-02-23 LAB — D-DIMER, QUANTITATIVE: D-Dimer, Quant: 0.53 ug/mL-FEU — ABNORMAL HIGH (ref 0.00–0.50)

## 2020-02-23 LAB — FERRITIN: Ferritin: 893 ng/mL — ABNORMAL HIGH (ref 24–336)

## 2020-02-23 LAB — COMPREHENSIVE METABOLIC PANEL
ALT: 61 U/L — ABNORMAL HIGH (ref 0–44)
AST: 25 U/L (ref 15–41)
Albumin: 2.9 g/dL — ABNORMAL LOW (ref 3.5–5.0)
Alkaline Phosphatase: 51 U/L (ref 38–126)
Anion gap: 9 (ref 5–15)
BUN: 30 mg/dL — ABNORMAL HIGH (ref 8–23)
CO2: 22 mmol/L (ref 22–32)
Calcium: 8.6 mg/dL — ABNORMAL LOW (ref 8.9–10.3)
Chloride: 106 mmol/L (ref 98–111)
Creatinine, Ser: 0.77 mg/dL (ref 0.61–1.24)
GFR, Estimated: >60 mL/min (ref >60)
Glucose, Bld: 150 mg/dL — ABNORMAL HIGH (ref 70–99)
Potassium: 5 mmol/L (ref 3.5–5.1)
Sodium: 137 mmol/L (ref 135–145)
Total Bilirubin: 0.6 mg/dL (ref 0.3–1.2)
Total Protein: 5.8 g/dL — ABNORMAL LOW (ref 6.5–8.1)

## 2020-02-23 LAB — C-REACTIVE PROTEIN: CRP: 0.8 mg/dL (ref <1.0)

## 2020-02-23 MED ORDER — ATORVASTATIN CALCIUM 10 MG PO TABS
10.0000 mg | ORAL_TABLET | Freq: Every day | ORAL | Status: DC
  Administered 2020-02-23 – 2020-02-26 (×4): 10 mg via ORAL
  Filled 2020-02-23 (×4): qty 1

## 2020-02-23 NOTE — Progress Notes (Addendum)
PROGRESS NOTE    Darren Wood  TDS:[id number] DOB: 1960/08/01 DOA: 02/13/2020 PCP: Loyal Jacobson, MD    Brief Narrative:  Darren Wood was admitted to the hospital working diagnosis of acute hypoxic respiratory failure due to SARS COVID-19 viral pneumonia.  59 year old male with past medical history of hypertension, obesity class II, dyslipidemia and degenerative joint disease.  Patient tested positive for COVID-19 02/07/2020, symptoms consistent with fatigue, body aches, malaise, fever, cough and dyspnea.  Due to worsening symptoms he presented to the hospital for further evaluation.  On his initial physical examination his oximetry was 72 to 83% on room air, his respiratory rate was 35-47, heart rate 90, blood pressure 108/67, his lungs were clear to auscultation bilaterally, heart S1-S2, present rhythmic, soft abdomen, no lower extremity edema.   His chest radiograph had bilateral interstitial infiltrates peripherally at lower lobes.  Patient received supplemental oxygen per nasal cannula, medical therapy with remdesivir, systemic corticosteroids and Tocilizumab.  Follow up chest film 10/18 personally reviewed with no significant change in bilateral lower lobes infiltrates. Patient received a short course of antibiotic therapy for aspiration pneumonitis.   Slowly improving symptoms and oxygen requirements.   Assessment & Plan:   Principal Problem:   Pneumonia due to COVID-19 virus Active Problems:   Essential hypertension   History of total hip arthroplasty, left   Obesity (BMI 30-39.9)   Pure hypercholesterolemia   Acute respiratory failure with hypoxia (HCC)   1.  Acute hypoxic respiratory failure due to SARS COVID-19 viral pneumonia. SP remdesivir #5/5, Tocilizumab #1 (10/12).   RR: 24  Pulse oxymetry: 88%  Fi02: 8 L/min per HFNC this AM down to 6 L/ min  COVID-19 Labs  Recent Labs    02/21/20 1043 02/22/20 0404 02/23/20 0348  DDIMER 0.84* 0.56* 0.53*  FERRITIN  --    --  893*  CRP  --  0.9 0.8    Lab Results  Component Value Date   SARSCOV2NAA POSITIVE (A) 02/13/2020     Tolerating well systemic corticosteroids with methylprednisolone at 50 mg daily, continue with bronchodilators, airway clearing techniques and antitussive agents.   Out of bed to chair tid with meals,follow with PT/OT. Patient will need ambulatory oxymetry before discharge.   2. Aspiration pneumonia/ pneumonitis.  Patient now off antibiotic therapy, continue to to follow up on cell count in am. No further clinical sings of aspiration.   3. HTN/ dislipidemia. On losartan for blood pressure control, continue to hold on HCTZ for now. Resume on statin therapy.   4. Oral thrush. Continue with oral nystatin.   5. Obesity class 2/ DJD. Continue to encourage mobility.   Status is: Inpatient  Remains inpatient appropriate because:IV treatments appropriate due to intensity of illness or inability to take PO   Dispo: The patient is from: Home              Anticipated d/c is to: Home              Anticipated d/c date is: 2 days              Patient currently is not medically stable to d/c.   DVT prophylaxis: Enoxaparin   Code Status:   full  Family Communication:   I spoke over the phone with the patient's wife about patient's  condition, plan of care, prognosis and all questions were addressed.    Nutrition Status: Nutrition Problem: Increased nutrient needs Etiology: acute illness, catabolic illness (COVID-19 infection) Signs/Symptoms: estimated needs Interventions: Ensure  Enlive (each supplement provides 350kcal and 20 grams of protein), Other (Comment) (Prosource Plus)     Subjective: Patient is feeling better, continue to have dyspnea on exertion, no nausea or vomiting, no chest pain.   Objective: Vitals:   02/22/20 0411 02/22/20 1348 02/22/20 2015 02/23/20 0406  BP: 139/83 (!) 149/82 129/87 139/82  Pulse: 93 (!) 110 (!) 101 81  Resp: 20 18 18  (!) 24  Temp:  98.1 F (36.7 C) 97.9 F (36.6 C) 98 F (36.7 C) 97.6 F (36.4 C)  TempSrc:   Oral Oral  SpO2: 96% 94% 96% (!) 88%  Weight:      Height:        Intake/Output Summary (Last 24 hours) at 02/23/2020 1028 Last data filed at 02/23/2020 0407 Gross per 24 hour  Intake 480 ml  Output 750 ml  Net -270 ml   Filed Weights   02/13/20 1622 02/13/20 2017  Weight: 110.2 kg 109.3 kg    Examination:   General: Not in pain or dyspnea. Deconditioned  Neurology: Awake and alert, non focal  E ENT: mild pallor, no icterus, oral mucosa moist Cardiovascular: No JVD. S1-S2 present, rhythmic, no gallops, rubs, or murmurs. No lower extremity edema. Pulmonary: positive breath sounds bilaterally,with no wheezing, rhonchi or rales. Gastrointestinal. Abdomen soft and non tender Skin. No rashes Musculoskeletal: no joint deformities     Data Reviewed: I have personally reviewed following labs and imaging studies  CBC: Recent Labs  Lab 02/20/20 0431 02/21/20 1043 02/22/20 0404  WBC 33.2* 39.0* 32.6*  HGB 16.3 16.4 15.2  HCT 48.3 48.7 46.0  MCV 87.0 87.9 90.2  PLT 457* 432* 354   Basic Metabolic Panel: Recent Labs  Lab 02/18/20 0601 02/20/20 0431 02/21/20 1043 02/22/20 0404 02/23/20 0348  NA 146* 140 140 137 137  K 4.5 4.8 4.7 4.6 5.0  CL 105 103 105 104 106  CO2 25 24 23  20* 22  GLUCOSE 169* 201* 159* 204* 150*  BUN 45* 40* 38* 35* 30*  CREATININE 1.04 1.08 0.97 0.89 0.77  CALCIUM 9.4 8.9 8.9 8.7* 8.6*  MG  --  2.5*  --   --   --    GFR: Estimated Creatinine Clearance: 123.8 mL/min (by C-G formula based on SCr of 0.77 mg/dL). Liver Function Tests: Recent Labs  Lab 02/18/20 0601 02/20/20 0431 02/21/20 1043 02/22/20 0404 02/23/20 0348  AST 38 31 29 29 25   ALT 67* 47* 59* 65* 61*  ALKPHOS 61 63 56 56 51  BILITOT 0.7 0.8 1.2 1.0 0.6  PROT 7.1 6.8 6.6 6.3* 5.8*  ALBUMIN 3.4* 3.4* 3.3* 3.1* 2.9*   No results for input(s): LIPASE, AMYLASE in the last 168 hours. No  results for input(s): AMMONIA in the last 168 hours. Coagulation Profile: No results for input(s): INR, PROTIME in the last 168 hours. Cardiac Enzymes: No results for input(s): CKTOTAL, CKMB, CKMBINDEX, TROPONINI in the last 168 hours. BNP (last 3 results) No results for input(s): PROBNP in the last 8760 hours. HbA1C: No results for input(s): HGBA1C in the last 72 hours. CBG: No results for input(s): GLUCAP in the last 168 hours. Lipid Profile: No results for input(s): CHOL, HDL, LDLCALC, TRIG, CHOLHDL, LDLDIRECT in the last 72 hours. Thyroid Function Tests: No results for input(s): TSH, T4TOTAL, FREET4, T3FREE, THYROIDAB in the last 72 hours. Anemia Panel: Recent Labs    02/23/20 0348  FERRITIN 893*      Radiology Studies: I have reviewed all of the imaging during  this hospital visit personally     Scheduled Meds: . (feeding supplement) PROSource Plus  30 mL Oral BID BM  . chlorpheniramine-HYDROcodone  5 mL Oral Q12H  . feeding supplement  237 mL Oral BID BM  . losartan  25 mg Oral Daily  . methylPREDNISolone (SOLU-MEDROL) injection  50 mg Intravenous Daily  . nystatin  5 mL Mouth/Throat QID   Continuous Infusions:   LOS: 10 days        Frankie Scipio Annett Gula, MD

## 2020-02-23 NOTE — Evaluation (Signed)
Physical Therapy Evaluation Patient Details Name: Darren Wood MRN: [id number] DOB: 12-19-1960 Today's Date: 02/23/2020   History of Present Illness  59 y.o. male with PMH of HTN, hyperlipidemia, obestiry, who reports he tested positive for Covid on 02/07/2020 and was managing at home, reports to ED with c/o fatigue, body aches, malaise, shortness of breath along with mild cough and fever.  Clinical Impression  Pt admitted with above diagnosis. Pt slightly unsteady with standing and transfer, but able to complete without AD or physical assistance. Pt tolerates seated therapeutic exercises with mild SOB noted. Ambulation limited due to O2 tubing length. Educated pt on exercises and encouraged additional reps/sets as tolerable, pt demonstrates good knowledge of spirometry and verbalizes understanding of prone/sidelying benefits. RN notified of SpO2 during session. Pt currently with functional limitations due to the deficits listed below (see PT Problem List). Pt will benefit from skilled PT to increase their independence and safety with mobility to allow discharge to the venue listed below.       Follow Up Recommendations No PT follow up    Equipment Recommendations  None recommended by PT    Recommendations for Other Services       Precautions / Restrictions Precautions Precautions: Fall Precaution Comments: monitor O2 Restrictions Weight Bearing Restrictions: No      Mobility  Bed Mobility Overal bed mobility: Modified Independent  General bed mobility comments: slow to mobilize to EOB with light use of bedrail    Transfers Overall transfer level: Needs assistance Equipment used: None Transfers: Sit to/from [employer] Transfers Sit to Stand: Supervision Stand pivot transfers: Supervision  General transfer comment: slightly unsteady upon rising that improves  Ambulation/Gait Ambulation/Gait assistance: Min guard  Assistive device: None Gait Pattern/deviations:  Step-through pattern;Decreased stride length  General Gait Details: limited to a few short steps at bedside over to chair due to O2 tubing, slightly unsteady but no overt LOB or physical assistance needed  Stairs            Wheelchair Mobility    Modified Rankin (Stroke Patients Only)       Balance Overall balance assessment: Mild deficits observed, not formally tested           Pertinent Vitals/Pain Pain Assessment: No/denies pain    Home Living Family/patient expects to be discharged to:: Private residence Living Arrangements: Spouse/significant other Available Help at Discharge: Family;Available PRN/intermittently Type of Home: House Home Access: Stairs to enter Entrance Stairs-Rails: Right;Left Entrance Stairs-Number of Steps: 7  Home Layout: Two level;Other (Comment);Able to live on main level with bedroom/bathroom (uses basement to watch football) Home Equipment: [employer] - single point      Prior Function Level of Independence: Independent         Comments: Pt reports independent with ADLs, community ambulation, drives, builds school buses full time.     Hand Dominance   Dominant Hand: Right    Extremity/Trunk Assessment   Upper Extremity Assessment Upper Extremity Assessment: Generalized weakness    Lower Extremity Assessment Lower Extremity Assessment: Defer to PT evaluation    Cervical / Trunk Assessment Cervical / Trunk Assessment: Normal  Communication   Communication: No difficulties  Cognition Arousal/Alertness: Awake/alert Behavior During Therapy: WFL for tasks assessed/performed Overall Cognitive Status: Within Functional Limits for tasks assessed         General Comments General comments (skin integrity, edema, etc.): 10L with SpO2 89-90% upon arrival [facility] elevated in bed; with transfer and LE exercises SpO2 drop to 85% and mild SOB noted  Exercises General Exercises - Lower Extremity Long Arc Quad:  Seated;AROM;Strengthening;Both;10 reps Hip Flexion/Marching: Seated;AROM;Strengthening;Both;10 reps Toe Raises: Seated;AROM;Strengthening;Both;10 reps Heel Raises: Seated;AROM;Strengthening;Both;10 reps Other Exercises Other Exercises: IS x15 Other Exercises: instructed patient in theraband exercises and provided handout, patient perform x10 of elbow flexion, shoulder flexion, diagonal pulls, chest pulls   Assessment/Plan    PT Assessment Patient needs continued PT services  PT Problem List Decreased activity tolerance;Decreased balance;Decreased knowledge of use of DME;Cardiopulmonary status limiting activity       PT Treatment Interventions DME instruction;Gait training;Functional mobility training;Therapeutic activities;Therapeutic exercise;Balance training;Patient/family education    PT Goals (Current goals can be found in the Care Plan section)  Acute Rehab PT Goals Patient Stated Goal: get better PT Goal Formulation: With patient Time For Goal Achievement: 03/01/20 Potential to Achieve Goals: Good    Frequency Min 3X/week   Barriers to discharge        Co-evaluation PT/OT/SLP Co-Evaluation/Treatment: Yes Reason for Co-Treatment: To address functional/ADL transfers PT goals addressed during session: Mobility/safety with mobility;Balance;Strengthening/ROM OT goals addressed during session: ADL's and self-care       AM-PAC PT "6 Clicks" Mobility  Outcome Measure Help needed turning from your back to your side while in a flat bed without using bedrails?: None Help needed moving from lying on your back to sitting on the side of a flat bed without using bedrails?: None Help needed moving to and from a bed to a chair (including a wheelchair)?: A Little Help needed standing up from a chair using your arms (e.g., wheelchair or bedside chair)?: A Little Help needed to walk in hospital room?: A Little Help needed climbing 3-5 steps with a railing? : A Little 6 Click Score:  20    End of Session Equipment Utilized During Treatment: Oxygen Activity Tolerance: Patient tolerated treatment well Patient left: in chair;with call bell/phone within reach Nurse Communication: Mobility status;Other (comment) (O2) PT Visit Diagnosis: Other abnormalities of gait and mobility (R26.89);Unsteadiness on feet (R26.81)    Time: 1345-1410 PT Time Calculation (min) (ACUTE ONLY): 25 min   Charges:   PT Evaluation $PT Eval Moderate Complexity: 1 Mod          Tori Brionne Mertz PT, DPT 02/23/20, 3:29 PM

## 2020-02-23 NOTE — Evaluation (Signed)
Occupational Therapy Evaluation Patient Details Name: Darren Wood MRN: [id number] DOB: 10/11/1960 Today's Date: 02/23/2020    History of Present Illness 59 y.o. male with PMH of HTN, hyperlipidemia, obestiry, who reports he tested positive for Covid on 02/07/2020 and was managing at home, reports to ED with c/o fatigue, body aches, malaise, shortness of breath along with mild cough and fever.   Clinical Impression   Patient with functional deficits listed below impacting safety and independence with self care. Patient primarily supervision level for functional ambulation, transfers and LB ADLs due to mild unsteadiness and decreased activity tolerance. Patient educated on use of UE exercises, breathing techniques, breathing devices, and mobility for recovery. Patient verbalized understanding. Patient will benefit from skilled OT services to improve activity tolerance and cardiopulmonary status to reduce oxygenation needs in order for patient to return home at discharge.     Follow Up Recommendations  No OT follow up    Equipment Recommendations  Tub/shower seat       Precautions / Restrictions Precautions Precautions: Fall Precaution Comments: monitor O2 Restrictions Weight Bearing Restrictions: No      Mobility Bed Mobility Overal bed mobility: Modified Independent                  Transfers Overall transfer level: Needs assistance Equipment used: None Transfers: Sit to/from Stand;Stand Pivot Transfers Sit to Stand: Supervision Stand pivot transfers: Supervision       General transfer comment: S for safety    Balance Overall balance assessment: Mild deficits observed, not formally tested                                         ADL either performed or assessed with clinical judgement   ADL Overall ADL's : Needs assistance/impaired Eating/Feeding: Independent   Grooming: Set up   Upper Body Bathing: Set up;Sitting   Lower Body Bathing:  Supervison/ safety;Sitting/lateral leans   Upper Body Dressing : Set up;Sitting   Lower Body Dressing: Supervision/safety;Sit to/from stand   Toilet Transfer: Supervision/safety;Stand-pivot Statistician Details (indicate cue type and reason): to recliner, supervision for safety as patient is mildly unsteady, decreased activity tolerance Toileting- Clothing Manipulation and Hygiene: Supervision/safety;Sit to/from stand       Functional mobility during ADLs: Supervision/safety General ADL Comments: patient decreased activity tolerance and cardiopulmonary status impacting independence with self care                  Pertinent Vitals/Pain Pain Assessment: No/denies pain     Hand Dominance Right   Extremity/Trunk Assessment Upper Extremity Assessment Upper Extremity Assessment: Generalized weakness   Lower Extremity Assessment Lower Extremity Assessment: Defer to PT evaluation   Cervical / Trunk Assessment Cervical / Trunk Assessment: Normal   Communication Communication Communication: No difficulties   Cognition Arousal/Alertness: Awake/alert Behavior During Therapy: WFL for tasks assessed/performed Overall Cognitive Status: Within Functional Limits for tasks assessed                                     General Comments  pt on 10L upon arrival saturating 89-90% semi-supine in bed. With transfer to chair and UE exercises note O2 drop to 85% with adequate wave form patient reporting mild dyspnea     Exercises Exercises: Other exercises Other Exercises Other Exercises: IS x15 Other Exercises: instructed patient  in theraband exercises and provided handout, patient perform x10 of elbow flexion, shoulder flexion, diagonal pulls, chest pulls        Home Living Family/patient expects to be discharged to:: Private residence Living Arrangements: Spouse/significant other Available Help at Discharge: Family;Available PRN/intermittently Type of Home:  House Home Access: Stairs to enter [employer] of Steps: 7  Entrance Stairs-Rails: Right;Left Home Layout: Two level;Other (Comment);Able to live on main level with bedroom/bathroom (uses basement to watch football)     Bathroom Shower/Tub: Chief Strategy Officer: Standard     Home Equipment: Cane - single point          Prior Functioning/Environment Level of Independence: Independent        Comments: Pt reports independent with ADLs, community ambulation, drives, builds school buses full time.        OT Problem List: Decreased activity tolerance;Impaired balance (sitting and/or standing);Cardiopulmonary status limiting activity;Decreased strength      OT Treatment/Interventions: Self-care/ADL training;Therapeutic exercise;Energy conservation;DME and/or AE instruction;Therapeutic activities;Patient/family education;Balance training    OT Goals(Current goals can be found in the care plan section) Acute Rehab OT Goals Patient Stated Goal: get better OT Goal Formulation: With patient Time For Goal Achievement: 03/08/20 Potential to Achieve Goals: Good  OT Frequency: Min 2X/week           Co-evaluation PT/OT/SLP Co-Evaluation/Treatment: Yes Reason for Co-Treatment: To address functional/ADL transfers   OT goals addressed during session: ADL's and self-care      AM-PAC OT "6 Clicks" Daily Activity     Outcome Measure Help from another person eating meals?: None Help from another person taking care of personal grooming?: A Little Help from another person toileting, which includes using toliet, bedpan, or urinal?: A Little Help from another person bathing (including washing, rinsing, drying)?: A Little Help from another person to put on and taking off regular upper body clothing?: A Little Help from another person to put on and taking off regular lower body clothing?: A Little 6 Click Score: 19   End of Session Equipment Utilized During  Treatment: Oxygen Nurse Communication: Mobility status;Other (comment) (O2 saturations)  Activity Tolerance: Patient tolerated treatment well Patient left: in chair;with call bell/phone within reach  OT Visit Diagnosis: Muscle weakness (generalized) (M62.81)                Time: 7341-9379 OT Time Calculation (min): 36 min Charges:  OT General Charges $OT Visit: 1 Visit OT Evaluation $OT Eval Moderate Complexity: 1 Mod  Marlyce Huge OT OT pager: [phone]  Carmelia Roller 02/23/2020, 3:09 PM

## 2020-02-24 LAB — D-DIMER, QUANTITATIVE: D-Dimer, Quant: 1.86 ug/mL-FEU — ABNORMAL HIGH (ref 0.00–0.50)

## 2020-02-24 LAB — CBC WITH DIFFERENTIAL/PLATELET
Abs Immature Granulocytes: 0.37 10*3/uL — ABNORMAL HIGH (ref 0.00–0.07)
Basophils Absolute: 0 10*3/uL (ref 0.0–0.1)
Basophils Relative: 0 %
Eosinophils Absolute: 0 10*3/uL (ref 0.0–0.5)
Eosinophils Relative: 0 %
HCT: 42.9 % (ref 39.0–52.0)
Hemoglobin: 14 g/dL (ref 13.0–17.0)
Immature Granulocytes: 2 %
Lymphocytes Relative: 8 %
Lymphs Abs: 1.9 10*3/uL (ref 0.7–4.0)
MCH: 29.2 pg (ref 26.0–34.0)
MCHC: 32.6 g/dL (ref 30.0–36.0)
MCV: 89.4 fL (ref 80.0–100.0)
Monocytes Absolute: 1.8 10*3/uL — ABNORMAL HIGH (ref 0.1–1.0)
Monocytes Relative: 8 %
Neutro Abs: 20.2 10*3/uL — ABNORMAL HIGH (ref 1.7–7.7)
Neutrophils Relative %: 82 %
Platelets: 264 10*3/uL (ref 150–400)
RBC: 4.8 MIL/uL (ref 4.22–5.81)
RDW: 15.3 % (ref 11.5–15.5)
WBC: 24.4 10*3/uL — ABNORMAL HIGH (ref 4.0–10.5)
nRBC: 0 % (ref 0.0–0.2)

## 2020-02-24 LAB — COMPREHENSIVE METABOLIC PANEL
ALT: 67 U/L — ABNORMAL HIGH (ref 0–44)
AST: 23 U/L (ref 15–41)
Albumin: 2.8 g/dL — ABNORMAL LOW (ref 3.5–5.0)
Alkaline Phosphatase: 51 U/L (ref 38–126)
Anion gap: 8 (ref 5–15)
BUN: 28 mg/dL — ABNORMAL HIGH (ref 8–23)
CO2: 23 mmol/L (ref 22–32)
Calcium: 8.4 mg/dL — ABNORMAL LOW (ref 8.9–10.3)
Chloride: 105 mmol/L (ref 98–111)
Creatinine, Ser: 0.78 mg/dL (ref 0.61–1.24)
GFR, Estimated: >60 mL/min (ref >60)
Glucose, Bld: 155 mg/dL — ABNORMAL HIGH (ref 70–99)
Potassium: 4.7 mmol/L (ref 3.5–5.1)
Sodium: 136 mmol/L (ref 135–145)
Total Bilirubin: 0.7 mg/dL (ref 0.3–1.2)
Total Protein: 5.3 g/dL — ABNORMAL LOW (ref 6.5–8.1)

## 2020-02-24 LAB — C-REACTIVE PROTEIN: CRP: 1.1 mg/dL — ABNORMAL HIGH (ref <1.0)

## 2020-02-24 LAB — FERRITIN: Ferritin: 879 ng/mL — ABNORMAL HIGH (ref 24–336)

## 2020-02-24 NOTE — Progress Notes (Signed)
   02/24/20 2101  Assess: MEWS Score  Temp 97.8 F (36.6 C)  BP 124/86  Pulse Rate (!) 118  Resp (!) 21  SpO2 91 %  Assess: MEWS Score  MEWS Temp 0  MEWS Systolic 0  MEWS Pulse 2  MEWS RR 1  MEWS LOC 0  MEWS Score 3  MEWS Score Color Yellow  Assess: if the MEWS score is Yellow or Red  Were vital signs taken at a resting state? Yes  Focused Assessment No change from prior assessment  Early Detection of Sepsis Score *See Row Information* Medium  MEWS guidelines implemented *See Row Information* Yes  Take Vital Signs  Increase Vital Sign Frequency  Yellow: Q 2hr X 2 then Q 4hr X 2, if remains yellow, continue Q 4hrs  Escalate  MEWS: Escalate Yellow: discuss with charge nurse/RN and consider discussing with provider and RRT  Notify: Charge Nurse/RN  Name of Charge Nurse/RN Notified Tom, RN  Date Charge Nurse/RN Notified 02/24/20  Time Charge Nurse/RN Notified 2109   Pt sitting up in chair, in no acute distress. Denies feeling any different than before. Per order, vital signs do not meet parameters to notify providers.

## 2020-02-24 NOTE — Progress Notes (Signed)
PROGRESS NOTE    Darren Wood  XBJ:[id number] DOB: 08-30-1960 DOA: 02/13/2020 PCP: Loyal Jacobson, MD    Brief Narrative:  Darren Wood admitted to the hospital working diagnosis of acute hypoxic respiratory failure due to SARS COVID-19 viral pneumonia.  60 year old male with past medical history of hypertension, obesity class II, dyslipidemia and degenerative joint disease. Patient tested positive for COVID-19 02/07/2020, symptoms consistent with fatigue, body aches, malaise, fever, cough and dyspnea. Due to worsening symptoms he presented to the hospital for further evaluation. On his initial physical examination his oximetry was 72 to 83% on room air, his respiratory rate was 35-47, heart rate 90, blood pressure 108/67, his lungs were clear to auscultation bilaterally, heart S1-S2, present rhythmic, soft abdomen, no lower extremity edema.  His chest radiograph had bilateral interstitial infiltrates peripherally at lower lobes.  Patient received supplemental oxygen per nasal cannula, medical therapy with remdesivir, systemic corticosteroids and Tocilizumab.  Follow up chest film 10/18 personally reviewed with no significant change in bilateral lower lobes infiltrates. Patient received a short course of antibiotic therapy for aspiration pneumonitis.   Slowly improving symptoms and oxygen requirements   Assessment & Plan:   Principal Problem:   Pneumonia due to COVID-19 virus Active Problems:   Essential hypertension   History of total hip arthroplasty, left   Obesity (BMI 30-39.9)   Pure hypercholesterolemia   Acute respiratory failure with hypoxia (HCC)   1.Acute hypoxic respiratory failure due to SARS COVID-19 viral pneumonia. SP remdesivir #5/5, Tocilizumab #1 (10/12).  RR: 20  Pulse oxymetry: 95%  Fi02: 10 L/ min per HFNC, decreased to 5 L/ HFNC   COVID-19 Labs  Recent Labs    02/22/20 0404 02/23/20 0348 02/24/20 0348  DDIMER 0.56* 0.53* 1.86*    FERRITIN  --  893* 879*  CRP 0.9 0.8 1.1*    Lab Results  Component Value Date   SARSCOV2NAA POSITIVE (A) 02/13/2020   Continue to have elevated inflammatory markers, continue to have high oxygen requirements.   Continue with systemic corticosteroids (methylprednisolone at 50 mg daily), bronchodilators, airway clearing techniques and antitussive agents.   Taper supplemental 02 as tolerated, target 02 saturation 88% or more.  No follow up for PT and OT.   2. Aspiration pneumonia/ pneumonitis.  No further aspiration, continue oxymetry monitoring.    3. HTN/ dislipidemia. Continue blood pressure control with losartan. Continue with statin therapy.   4. Oral thrush. Responding well to oral nystatin.  5. Obesity class 2/ DJD. Out of bed to chair tid meals as tolerated.     Status is: Inpatient  Remains inpatient appropriate because:IV treatments appropriate due to intensity of illness or inability to take PO   Dispo: The patient is from: Home              Anticipated d/c is to: Home              Anticipated d/c date is: 2 days              Patient currently is not medically stable to d/c.   DVT prophylaxis: Enoxaparin   Code Status:   full  Family Communication:  I spoke over the phone with the patient's wife about patient's  condition, plan of care, prognosis and all questions were addressed.     Nutrition Status: Nutrition Problem: Increased nutrient needs Etiology: acute illness, catabolic illness (COVID-19 infection) Signs/Symptoms: estimated needs Interventions: Ensure Enlive (each supplement provides 350kcal and 20 grams of protein), Other (Comment) (Prosource Plus)  Subjective: Patient is feeling better, but not yet back to baseline, continue to have dyspnea. He has not been able to sleep well due to interruptions.   Objective: Vitals:   02/24/20 0108 02/24/20 0406 02/24/20 0422 02/24/20 0815  BP:   116/70 (!) 147/83  Pulse: 81 62 79 77   Resp:   (!) 21 20  Temp:   98 F (36.7 C) 97.8 F (36.6 C)  TempSrc:   Oral   SpO2: 96% 99% 96% 95%  Weight:      Height:        Intake/Output Summary (Last 24 hours) at 02/24/2020 1118 Last data filed at 02/24/2020 0500 Gross per 24 hour  Intake 720 ml  Output 1375 ml  Net -655 ml   Filed Weights   02/13/20 1622 02/13/20 2017  Weight: 110.2 kg 109.3 kg    Examination:   General: Not in pain or dyspnea. Deconditioned  Neurology: Awake and alert, non focal  E ENT: no pallor, no icterus, oral mucosa moist Cardiovascular: No JVD. S1-S2 present, rhythmic, no gallops, rubs, or murmurs. No lower extremity edema. Pulmonary: positive breath sounds bilaterally, with no wheezing, rhonchi or rales. Gastrointestinal. Abdomen soft and non tender Skin. No rashes Musculoskeletal: no joint deformities     Data Reviewed: I have personally reviewed following labs and imaging studies  CBC: Recent Labs  Lab 02/20/20 0431 02/21/20 1043 02/22/20 0404 02/24/20 0348  WBC 33.2* 39.0* 32.6* 24.4*  NEUTROABS  --   --   --  20.2*  HGB 16.3 16.4 15.2 14.0  HCT 48.3 48.7 46.0 42.9  MCV 87.0 87.9 90.2 89.4  PLT 457* 432* 354 264   Basic Metabolic Panel: Recent Labs  Lab 02/20/20 0431 02/21/20 1043 02/22/20 0404 02/23/20 0348 02/24/20 0348  NA 140 140 137 137 136  K 4.8 4.7 4.6 5.0 4.7  CL 103 105 104 106 105  CO2 24 23 20* 22 23  GLUCOSE 201* 159* 204* 150* 155*  BUN 40* 38* 35* 30* 28*  CREATININE 1.08 0.97 0.89 0.77 0.78  CALCIUM 8.9 8.9 8.7* 8.6* 8.4*  MG 2.5*  --   --   --   --    GFR: Estimated Creatinine Clearance: 123.8 mL/min (by C-G formula based on SCr of 0.78 mg/dL). Liver Function Tests: Recent Labs  Lab 02/20/20 0431 02/21/20 1043 02/22/20 0404 02/23/20 0348 02/24/20 0348  AST 31 29 29 25 23   ALT 47* 59* 65* 61* 67*  ALKPHOS 63 56 56 51 51  BILITOT 0.8 1.2 1.0 0.6 0.7  PROT 6.8 6.6 6.3* 5.8* 5.3*  ALBUMIN 3.4* 3.3* 3.1* 2.9* 2.8*   No results for  input(s): LIPASE, AMYLASE in the last 168 hours. No results for input(s): AMMONIA in the last 168 hours. Coagulation Profile: No results for input(s): INR, PROTIME in the last 168 hours. Cardiac Enzymes: No results for input(s): CKTOTAL, CKMB, CKMBINDEX, TROPONINI in the last 168 hours. BNP (last 3 results) No results for input(s): PROBNP in the last 8760 hours. HbA1C: No results for input(s): HGBA1C in the last 72 hours. CBG: No results for input(s): GLUCAP in the last 168 hours. Lipid Profile: No results for input(s): CHOL, HDL, LDLCALC, TRIG, CHOLHDL, LDLDIRECT in the last 72 hours. Thyroid Function Tests: No results for input(s): TSH, T4TOTAL, FREET4, T3FREE, THYROIDAB in the last 72 hours. Anemia Panel: Recent Labs    02/23/20 0348 02/24/20 0348  FERRITIN 893* 879*      Radiology Studies: I have reviewed all  of the imaging during this hospital visit personally     Scheduled Meds: . (feeding supplement) PROSource Plus  30 mL Oral BID BM  . atorvastatin  10 mg Oral Daily  . chlorpheniramine-HYDROcodone  5 mL Oral Q12H  . feeding supplement  237 mL Oral BID BM  . losartan  25 mg Oral Daily  . methylPREDNISolone (SOLU-MEDROL) injection  50 mg Intravenous Daily  . nystatin  5 mL Mouth/Throat QID   Continuous Infusions:   LOS: 11 days        Aliviyah Malanga Annett Gula, MD

## 2020-02-24 NOTE — Progress Notes (Signed)
Decreased O2 from 5L HFNC to 4L HFNC. Pt tolerating well satting at 94% @1830 . Will continue to monitor.

## 2020-02-24 NOTE — Progress Notes (Signed)
Decreased pt's O2 from 10L HFNC down to 8L HFNC. Pt did not tolerate well. Sats were 81%-83% with pt prone. Increased O2 to 9L HFNC pt tolerating well. Satting at 35 @1130 . Will continue to monitor.

## 2020-02-25 LAB — COMPREHENSIVE METABOLIC PANEL
ALT: 70 U/L — ABNORMAL HIGH (ref 0–44)
AST: 26 U/L (ref 15–41)
Albumin: 2.8 g/dL — ABNORMAL LOW (ref 3.5–5.0)
Alkaline Phosphatase: 52 U/L (ref 38–126)
Anion gap: 10 (ref 5–15)
BUN: 24 mg/dL — ABNORMAL HIGH (ref 8–23)
CO2: 21 mmol/L — ABNORMAL LOW (ref 22–32)
Calcium: 8.8 mg/dL — ABNORMAL LOW (ref 8.9–10.3)
Chloride: 104 mmol/L (ref 98–111)
Creatinine, Ser: 0.74 mg/dL (ref 0.61–1.24)
GFR, Estimated: >60 mL/min (ref >60)
Glucose, Bld: 171 mg/dL — ABNORMAL HIGH (ref 70–99)
Potassium: 4.8 mmol/L (ref 3.5–5.1)
Sodium: 135 mmol/L (ref 135–145)
Total Bilirubin: 0.8 mg/dL (ref 0.3–1.2)
Total Protein: 5.3 g/dL — ABNORMAL LOW (ref 6.5–8.1)

## 2020-02-25 LAB — FERRITIN: Ferritin: 1050 ng/mL — ABNORMAL HIGH (ref 24–336)

## 2020-02-25 LAB — C-REACTIVE PROTEIN: CRP: 0.8 mg/dL (ref <1.0)

## 2020-02-25 LAB — D-DIMER, QUANTITATIVE: D-Dimer, Quant: 2.55 ug/mL-FEU — ABNORMAL HIGH (ref 0.00–0.50)

## 2020-02-25 MED ORDER — METOPROLOL TARTRATE 5 MG/5ML IV SOLN
5.0000 mg | INTRAVENOUS | Status: DC | PRN
  Filled 2020-02-25: qty 5

## 2020-02-25 NOTE — Progress Notes (Signed)
PROGRESS NOTE    Darren Wood  ZOX:[id number] DOB: June 15, 1960 DOA: 02/13/2020 PCP: Loyal Jacobson, MD    Brief Narrative:  Darren Wood admitted to the hospital with the working diagnosis of acute hypoxic respiratory failure due to SARS COVID-19 viral pneumonia.  59 year old male with past medical history of hypertension, obesity class II, dyslipidemia and degenerative joint disease. Patient tested positive for COVID-19 02/07/2020, symptoms consistent with fatigue, body aches, malaise, fever, cough and dyspnea. Due to worsening symptoms he presented to the hospital for further evaluation. On his initial physical examination his oximetry was 72 to 83% on room air, his respiratory rate was 35-47, heart rate 90, blood pressure 108/67, his lungs were clear to auscultation bilaterally, heart S1-S2, present rhythmic, soft abdomen, no lower extremity edema.  His chest radiograph had bilateral interstitial infiltrates peripherally at lower lobes.  Patient received supplemental oxygen per nasal cannula, medical therapy with remdesivir, systemic corticosteroids and Tocilizumab.  Follow up chest film 10/18 personally reviewed with no significant change in bilateral lower lobes infiltrates.Patient received a short course of antibiotic therapy for aspiration pneumonitis.   Slowly improving symptoms and oxygen requirements   Assessment & Plan:   Principal Problem:   Pneumonia due to COVID-19 virus Active Problems:   Essential hypertension   History of total hip arthroplasty, left   Obesity (BMI 30-39.9)   Pure hypercholesterolemia   Acute respiratory failure with hypoxia (HCC)   1.Acute hypoxic respiratory failure due to SARS COVID-19 viral pneumonia. SP remdesivir #5/5, Tocilizumab #1 (10/12).  RR:22  Pulse oxymetry: 92% to 95%  Fi02: 8 L/ min per HFNC decreased to 4 L/ min this am.    COVID-19 Labs  Recent Labs    02/23/20 0348 02/24/20 0348 02/25/20 0349  DDIMER  0.53* 1.86* 2.55*  FERRITIN 893* 879* 1,050*  CRP 0.8 1.1* 0.8    Lab Results  Component Value Date   SARSCOV2NAA POSITIVE (A) 02/13/2020    His oxygen requirements are improving, but continue to have elevated inflammatory markers.   Tolerating well systemic corticosteroids(methylprednisolone at50 mg daily),bronchodilators, airway clearing techniquesand antitussive agents. Out of bed to chair tid with meals.   If inflammatory markers are better in am, possible dc home.   2. Aspiration pneumonia/ pneumonitis.Continue oxymetry monitoring. improving reactive leukocytosis.   3. HTN/ dislipidemia.On losartan for blood pressure control.  On statin therapy.  4. Oral thrush.Continue with oralnystatin.  5. Obesity class 2/ DJD. Out of bed to chair tid meals as tolerated.    Status is: Inpatient  Remains inpatient appropriate because:IV treatments appropriate due to intensity of illness or inability to take PO   Dispo: The patient is from: Home              Anticipated d/c is to: Home              Anticipated d/c date is: 1 day              Patient currently is not medically stable to d/c.   DVT prophylaxis: Enoxaparin   Code Status:   full  Family Communication: I spoke over the phone with the patient's  about patient's  condition, plan of care, prognosis and all questions were addressed.    Nutrition Status: Nutrition Problem: Increased nutrient needs Etiology: acute illness, catabolic illness (COVID-19 infection) Signs/Symptoms: estimated needs Interventions: Ensure Enlive (each supplement provides 350kcal and 20 grams of protein), Other (Comment) (Prosource Plus)      Subjective: Patient is feeling better, dyspnea continue to  improve, no nausea or vomiting, he has been out of bed.   Objective: Vitals:   02/24/20 2301 02/25/20 0058 02/25/20 0505 02/25/20 0916  BP: 115/80 (!) 115/92 127/75 138/73  Pulse: 94 91 75 76  Resp: (!) 30 (!) 25 (!) 25 (!)  22  Temp: 98 F (36.7 C) 98.5 F (36.9 C) 98.1 F (36.7 C) 98 F (36.7 C)  TempSrc: Oral Oral Oral Oral  SpO2: 96% 94% 95% 92%  Weight:      Height:        Intake/Output Summary (Last 24 hours) at 02/25/2020 0942 Last data filed at 02/25/2020 0900 Gross per 24 hour  Intake 240 ml  Output 1660 ml  Net -1420 ml   Filed Weights   02/13/20 1622 02/13/20 2017  Weight: 110.2 kg 109.3 kg    Examination:   General: Not in pain or dyspnea, deconditioned  Neurology: Awake and alert, non focal  E ENT: no pallor, no icterus, oral mucosa moist Cardiovascular: No JVD. S1-S2 present, rhythmic, no gallops, rubs, or murmurs. No lower extremity edema. Pulmonary: positive breath sounds bilaterally,  Gastrointestinal. Abdomen soft and non tender Skin. No rashes Musculoskeletal: no joint deformities     Data Reviewed: I have personally reviewed following labs and imaging studies  CBC: Recent Labs  Lab 02/20/20 0431 02/21/20 1043 02/22/20 0404 02/24/20 0348  WBC 33.2* 39.0* 32.6* 24.4*  NEUTROABS  --   --   --  20.2*  HGB 16.3 16.4 15.2 14.0  HCT 48.3 48.7 46.0 42.9  MCV 87.0 87.9 90.2 89.4  PLT 457* 432* 354 264   Basic Metabolic Panel: Recent Labs  Lab 02/20/20 0431 02/20/20 0431 02/21/20 1043 02/22/20 0404 02/23/20 0348 02/24/20 0348 02/25/20 0349  NA 140   < > 140 137 137 136 135  K 4.8   < > 4.7 4.6 5.0 4.7 4.8  CL 103   < > 105 104 106 105 104  CO2 24   < > 23 20* 22 23 21*  GLUCOSE 201*   < > 159* 204* 150* 155* 171*  BUN 40*   < > 38* 35* 30* 28* 24*  CREATININE 1.08   < > 0.97 0.89 0.77 0.78 0.74  CALCIUM 8.9   < > 8.9 8.7* 8.6* 8.4* 8.8*  MG 2.5*  --   --   --   --   --   --    < > = values in this interval not displayed.   GFR: Estimated Creatinine Clearance: 123.8 mL/min (by C-G formula based on SCr of 0.74 mg/dL). Liver Function Tests: Recent Labs  Lab 02/21/20 1043 02/22/20 0404 02/23/20 0348 02/24/20 0348 02/25/20 0349  AST 29 29 25 23 26     ALT 59* 65* 61* 67* 70*  ALKPHOS 56 56 51 51 52  BILITOT 1.2 1.0 0.6 0.7 0.8  PROT 6.6 6.3* 5.8* 5.3* 5.3*  ALBUMIN 3.3* 3.1* 2.9* 2.8* 2.8*   No results for input(s): LIPASE, AMYLASE in the last 168 hours. No results for input(s): AMMONIA in the last 168 hours. Coagulation Profile: No results for input(s): INR, PROTIME in the last 168 hours. Cardiac Enzymes: No results for input(s): CKTOTAL, CKMB, CKMBINDEX, TROPONINI in the last 168 hours. BNP (last 3 results) No results for input(s): PROBNP in the last 8760 hours. HbA1C: No results for input(s): HGBA1C in the last 72 hours. CBG: No results for input(s): GLUCAP in the last 168 hours. Lipid Profile: No results for input(s): CHOL, HDL, LDLCALC,  TRIG, CHOLHDL, LDLDIRECT in the last 72 hours. Thyroid Function Tests: No results for input(s): TSH, T4TOTAL, FREET4, T3FREE, THYROIDAB in the last 72 hours. Anemia Panel: Recent Labs    02/24/20 0348 02/25/20 0349  FERRITIN 879* 1,050*      Radiology Studies: I have reviewed all of the imaging during this hospital visit personally     Scheduled Meds: . (feeding supplement) PROSource Plus  30 mL Oral BID BM  . atorvastatin  10 mg Oral Daily  . chlorpheniramine-HYDROcodone  5 mL Oral Q12H  . feeding supplement  237 mL Oral BID BM  . losartan  25 mg Oral Daily  . methylPREDNISolone (SOLU-MEDROL) injection  50 mg Intravenous Daily  . nystatin  5 mL Mouth/Throat QID   Continuous Infusions:   LOS: 12 days        Taletha Twiford Annett Gula, MD

## 2020-02-25 NOTE — Progress Notes (Addendum)
Patient sinus tach since start of shift. Reviewed strips, sinus tach since about 1750 Denies any pain or discomfort at this time, no s/sx of distress noted at this time. Resp even and unlabored. Will review pta meds, current meds, and inform provider  2250- Secure text sent to provider regarding tachycardia.   2320- Patient assisted back to bed, HR back to NSR no indication for PRN Metoprolol, will continue to monitor.

## 2020-02-25 NOTE — Progress Notes (Signed)
SATURATION QUALIFICATIONS: (This note is used to comply with regulatory documentation for home oxygen)  Patient Saturations on Room Air at Rest = 92%  Patient Saturations on Room Air while Ambulating = 78%  Patient Saturations on 4 Liters of oxygen while Ambulating = 88%-90%  Please briefly explain why patient needs home oxygen: Pt needs oxygen due to SPO2 decreasing to 78% on room air.

## 2020-02-26 LAB — COMPREHENSIVE METABOLIC PANEL
ALT: 110 U/L — ABNORMAL HIGH (ref 0–44)
AST: 32 U/L (ref 15–41)
Albumin: 3 g/dL — ABNORMAL LOW (ref 3.5–5.0)
Alkaline Phosphatase: 56 U/L (ref 38–126)
Anion gap: 12 (ref 5–15)
BUN: 24 mg/dL — ABNORMAL HIGH (ref 8–23)
CO2: 24 mmol/L (ref 22–32)
Calcium: 9.1 mg/dL (ref 8.9–10.3)
Chloride: 100 mmol/L (ref 98–111)
Creatinine, Ser: 0.72 mg/dL (ref 0.61–1.24)
GFR, Estimated: >60 mL/min (ref >60)
Glucose, Bld: 174 mg/dL — ABNORMAL HIGH (ref 70–99)
Potassium: 5 mmol/L (ref 3.5–5.1)
Sodium: 136 mmol/L (ref 135–145)
Total Bilirubin: 0.9 mg/dL (ref 0.3–1.2)
Total Protein: 5.7 g/dL — ABNORMAL LOW (ref 6.5–8.1)

## 2020-02-26 LAB — FERRITIN: Ferritin: 1177 ng/mL — ABNORMAL HIGH (ref 24–336)

## 2020-02-26 LAB — D-DIMER, QUANTITATIVE: D-Dimer, Quant: 2.16 ug/mL-FEU — ABNORMAL HIGH (ref 0.00–0.50)

## 2020-02-26 LAB — C-REACTIVE PROTEIN: CRP: 0.8 mg/dL (ref <1.0)

## 2020-02-26 MED ORDER — ONDANSETRON HCL 4 MG PO TABS: 4.0000 mg | ORAL_TABLET | Freq: Three times a day (TID) | ORAL | 0 refills | Status: AC | PRN

## 2020-02-26 MED ORDER — NYSTATIN 100000 UNIT/ML MT SUSP: 5.0000 mL | Freq: Four times a day (QID) | OROMUCOSAL | 0 refills | Status: AC

## 2020-02-26 MED ORDER — GUAIFENESIN-DM 100-10 MG/5ML PO SYRP: 5.0000 mL | ORAL_SOLUTION | Freq: Four times a day (QID) | ORAL | 0 refills | Status: DC | PRN

## 2020-02-26 MED ORDER — ENSURE ENLIVE PO LIQD: 237.0000 mL | Freq: Two times a day (BID) | ORAL | 0 refills | Status: AC

## 2020-02-26 NOTE — Plan of Care (Signed)
  Problem: Clinical Measurements: Goal: Ability to maintain clinical measurements within normal limits will improve Outcome: Completed/Met Goal: Will remain free from infection Outcome: Completed/Met Goal: Diagnostic test results will improve Outcome: Completed/Met Goal: Respiratory complications will improve Outcome: Completed/Met Goal: Cardiovascular complication will be avoided Outcome: Completed/Met   Problem: Activity: Goal: Risk for activity intolerance will decrease Outcome: Completed/Met   Problem: Nutrition: Goal: Adequate nutrition will be maintained Outcome: Completed/Met   Problem: Elimination: Goal: Will not experience complications related to urinary retention Outcome: Completed/Met   Problem: Safety: Goal: Ability to remain free from injury will improve Outcome: Completed/Met   Problem: Respiratory: Goal: Will maintain a patent airway Outcome: Completed/Met Goal: Complications related to the disease process, condition or treatment will be avoided or minimized Outcome: Completed/Met

## 2020-02-26 NOTE — TOC Progression Note (Signed)
Transition of Care [facility]) - Progression Note    Patient Details  Name: Darren Wood MRN: [id number] Date of Birth: [birth year]  Transition of Care [facility]) CM/SW Contact  Armanda Heritage, RN Phone Number: 02/26/2020, 11:57 AM  Clinical Narrative:    Adapt to provide home oxygen.    Expected Discharge Plan: Home/Self Care Barriers to Discharge: No Barriers Identified  Expected Discharge Plan and Services Expected Discharge Plan: Home/Self Care   Discharge Planning Services: CM Consult     Expected Discharge Date: 02/26/20               DME Arranged: Oxygen DME Agency: [employer] Date DME Agency Contacted: 02/26/20 Time DME Agency Contacted: 1156 Representative spoke with at DME Agency: Lucrecia             Social Determinants of Health (SDOH) Interventions    Readmission Risk Interventions No flowsheet data found.

## 2020-02-26 NOTE — Discharge Summary (Signed)
Physician Discharge Summary  Darren Wood KYH:[id number] DOB: 1960/11/21 DOA: 02/13/2020  PCP: Loyal Jacobson, MD  Admit date: 02/13/2020 Discharge date: 02/26/2020  Admitted From: Home  Disposition:  Home   Recommendations for Outpatient Follow-up and new medication changes:  1. Follow up with Dr, Leonette Most in 2 weeks.  2. Continue self quarantine for 2 weeks, use a mask in public and maintain physical distancing. It is recommended COVID 19 vaccine after recovery per primary care.   Home Health: no   Equipment/Devices: home 02    Discharge Condition: stable  CODE STATUS: full Diet recommendation: stable   Brief/Interim Summary: Darren Wood admitted to the hospital with the working diagnosis of acute hypoxic respiratory failure due to SARS COVID-19 viral pneumonia.  59 year old male with past medical history of hypertension, obesity class II, dyslipidemia and degenerative joint disease. Patient tested positive for COVID-19 02/07/2020, symptoms consistent with fatigue, body aches, malaise, fever, cough and dyspnea. Due to worsening symptoms he presented to the hospital for further evaluation. On his initial physical examination his oximetry was 72 to 83% on room air, his respiratory rate was 35-47, heart rate 90, blood pressure 108/67, his lungs were clear to auscultation bilaterally, heart S1-S2, present rhythmic, soft abdomen, no lower extremity edema.  Sodium 137, potassium 3.9, chloride 100, bicarb 24, glucose 117, BUN 32, creatinine 1.0, white count 9.1, hemoglobin 14.2, hematocrit 43.3, platelets 256.  SARS COVID-19 positive.  Urinalysis negative for infection. EKG 96 bpm, normal axis, normal intervals, sinus rhythm, q-wave lead I and aVL, no ST segment or T wave changes.  His chest radiograph had bilateral interstitial infiltrates peripherally at lower lobes.  Patient received supplemental oxygen per nasal cannula, medical therapy with remdesivir, systemic corticosteroids and  Tocilizumab.  Follow up chest film 10/18 personally reviewed with no significant change in bilateral lower lobes infiltrates.Patient received a short course of antibiotic therapy for aspiration pneumonitis.   Slowly improving symptoms and oxygen requirements.  1.  Acute hypoxic respiratory failure due to SARS COVID-19 viral pneumonia.  Patient received supplemental oxygen per high flow nasal cannula, medical therapy with remdesivir, Tocilizumab and systemic corticosteroids. He had a slow recovery with eventual improvement of oxygenation, inflammatory markers and symptoms. Patient was treated with antitussive agents, bronchodilators and airway clearing techniques with incentive spirometer and flutter valve.  COVID-19 Labs  Recent Labs    02/24/20 0348 02/25/20 0349 02/26/20 0411  DDIMER 1.86* 2.55* 2.16*  FERRITIN 879* 1,050* 1,177*  CRP 1.1* 0.8 0.8    Lab Results  Component Value Date   SARSCOV2NAA POSITIVE (A) 02/13/2020   Patient will continue supplemental oxygen at home, 5 L/min, follow-up as an outpatient.  2.  Aspiration pneumonia/pneumonitis.  Patient had an episode of choking event, he received short course of antibiotics with good toleration. No further aspiration event.  3.  Hypertension/dyslipidemia.  Continue blood pressure control with losartan and continue statin therapy.  4.  Oral thrush.  Continue nystatin for 5 more days.  5.  Obesity class II, degenerative joint disease.  Follow-up as an outpatient, continue increase mobility at home.  PT OT evaluation with no follow-up recommended.   Discharge Diagnoses:  Principal Problem:   Pneumonia due to COVID-19 virus Active Problems:   Essential hypertension   History of total hip arthroplasty, left   Obesity (BMI 30-39.9)   Pure hypercholesterolemia   Acute respiratory failure with hypoxia [facility])    Discharge Instructions   Allergies as of 02/26/2020   No Known Allergies  Medication List     STOP taking these medications   omeprazole 20 MG capsule Commonly known as: PRILOSEC   ROBITUSSIN 12 HOUR COUGH PO     TAKE these medications   acetaminophen 500 MG tablet Commonly known as: TYLENOL Take 500 mg by mouth every 6 (six) hours as needed for fever.   atorvastatin 10 MG tablet Commonly known as: LIPITOR Take 10 mg by mouth daily.   bismuth subsalicylate 262 MG/15ML suspension Commonly known as: PEPTO BISMOL Take 30 mLs by mouth every 6 (six) hours as needed for indigestion.   feeding supplement Liqd Take 237 mLs by mouth 2 (two) times daily between meals.   guaiFENesin-dextromethorphan 100-10 MG/5ML syrup Commonly known as: ROBITUSSIN DM Take 5 mLs by mouth every 6 (six) hours as needed for cough.   losartan-hydrochlorothiazide 100-12.5 MG tablet Commonly known as: HYZAAR Take 1 tablet by mouth daily.   nystatin 100000 UNIT/ML suspension Commonly known as: MYCOSTATIN Use as directed 5 mLs (500,000 Units total) in the mouth or throat 4 (four) times daily for 5 days.   ondansetron 4 MG tablet Commonly known as: ZOFRAN Take 1 tablet (4 mg total) by mouth every 8 (eight) hours as needed for up to 7 days for nausea or vomiting.            Durable Medical Equipment  (From admission, onward)         Start     Ordered   02/26/20 0907  For home use only DME oxygen  Once       Question Answer Comment  Length of Need 6 Months   Mode or (Route) Nasal cannula   Liters per Minute 5   Frequency Continuous (stationary and portable oxygen unit needed)   Oxygen conserving device Yes   Oxygen delivery system Gas      02/26/20 0907          No Known Allergies      Procedures/Studies: DG CHEST PORT 1 VIEW  Result Date: 02/20/2020 CLINICAL DATA:  Hypoxia.  COVID positive EXAM: PORTABLE CHEST 1 VIEW COMPARISON:  02/13/2020 FINDINGS: Stable mild bilateral infiltrate and low lung volumes. No visible effusion or pneumothorax. Stable heart size.  IMPRESSION: Stable low volume chest with mild infiltrates. Electronically Signed   By: Marnee Spring M.D.   On: 02/20/2020 07:57   DG Chest Port 1 View  Result Date: 02/13/2020 CLINICAL DATA:  COVID-19 positive on 02/07/2020, cough, no nausea EXAM: PORTABLE CHEST 1 VIEW COMPARISON:  Radiograph 11/09/2017 FINDINGS: Some hazy and patchy opacities are present in the mid to lower lungs and periphery of the left lung base likely with some atelectatic changes as well given low lung volumes. No pneumothorax or visible effusion. The cardiomediastinal contours are unremarkable. No acute osseous or soft tissue abnormality. Degenerative changes are present in the imaged spine and shoulders. Telemetry leads overlie the chest. IMPRESSION: 1. Features concerning for developing pneumonia, in the setting of COVID-19 positivity. 2. Additionally, the lung volumes are low with atelectatic changes as well. Electronically Signed   By: Kreg Shropshire M.D.   On: 02/13/2020 15:19        Subjective: Patient is feeling well dyspnea continue to improve, no nausea or vomiting, no chest pain.   Discharge Exam: Vitals:   02/26/20 0146 02/26/20 0511  BP: 122/86 120/67  Pulse: 87 76  Resp:  (!) 22  Temp:  98 F (36.7 C)  SpO2:  97%   Vitals:   02/25/20 2127 02/26/20  0136 02/26/20 0146 02/26/20 0511  BP: 138/83 (!) 150/130 122/86 120/67  Pulse: (!) 114 70 87 76  Resp: 14 (!) 24  (!) 22  Temp: 97.8 F (36.6 C) 98.1 F (36.7 C)  98 F (36.7 C)  TempSrc: Oral Oral  Oral  SpO2: 95% 97%  97%  Weight:      Height:        General: Not in pain or dyspnea.  Neurology: Awake and alert, non focal  E ENT: no pallor, no icterus, oral mucosa moist Cardiovascular: No JVD. S1-S2 present, rhythmic, no gallops, rubs, or murmurs. No lower extremity edema. Pulmonary: positive breath sounds bilaterally, no wheezing, rhonchi or rales. Gastrointestinal. Abdomen soft and non tender Skin. No rashes Musculoskeletal: no joint  deformities   The results of significant diagnostics from this hospitalization (including imaging, microbiology, ancillary and laboratory) are listed below for reference.     Microbiology: No results found for this or any previous visit (from the past 240 hour(s)).   Labs: BNP (last 3 results) No results for input(s): BNP in the last 8760 hours. Basic Metabolic Panel: Recent Labs  Lab 02/20/20 0431 02/21/20 1043 02/22/20 0404 02/23/20 0348 02/24/20 0348 02/25/20 0349 02/26/20 0411  NA 140   < > 137 137 136 135 136  K 4.8   < > 4.6 5.0 4.7 4.8 5.0  CL 103   < > 104 106 105 104 100  CO2 24   < > 20* 22 23 21* 24  GLUCOSE 201*   < > 204* 150* 155* 171* 174*  BUN 40*   < > 35* 30* 28* 24* 24*  CREATININE 1.08   < > 0.89 0.77 0.78 0.74 0.72  CALCIUM 8.9   < > 8.7* 8.6* 8.4* 8.8* 9.1  MG 2.5*  --   --   --   --   --   --    < > = values in this interval not displayed.   Liver Function Tests: Recent Labs  Lab 02/22/20 0404 02/23/20 0348 02/24/20 0348 02/25/20 0349 02/26/20 0411  AST 29 25 23 26  32  ALT 65* 61* 67* 70* 110*  ALKPHOS 56 51 51 52 56  BILITOT 1.0 0.6 0.7 0.8 0.9  PROT 6.3* 5.8* 5.3* 5.3* 5.7*  ALBUMIN 3.1* 2.9* 2.8* 2.8* 3.0*   No results for input(s): LIPASE, AMYLASE in the last 168 hours. No results for input(s): AMMONIA in the last 168 hours. CBC: Recent Labs  Lab 02/20/20 0431 02/21/20 1043 02/22/20 0404 02/24/20 0348  WBC 33.2* 39.0* 32.6* 24.4*  NEUTROABS  --   --   --  20.2*  HGB 16.3 16.4 15.2 14.0  HCT 48.3 48.7 46.0 42.9  MCV 87.0 87.9 90.2 89.4  PLT 457* 432* 354 264   Cardiac Enzymes: No results for input(s): CKTOTAL, CKMB, CKMBINDEX, TROPONINI in the last 168 hours. BNP: Invalid input(s): POCBNP CBG: No results for input(s): GLUCAP in the last 168 hours. D-Dimer Recent Labs    02/25/20 0349 02/26/20 0411  DDIMER 2.55* 2.16*   Hgb A1c No results for input(s): HGBA1C in the last 72 hours. Lipid Profile No results for  input(s): CHOL, HDL, LDLCALC, TRIG, CHOLHDL, LDLDIRECT in the last 72 hours. Thyroid function studies No results for input(s): TSH, T4TOTAL, T3FREE, THYROIDAB in the last 72 hours.  Invalid input(s): FREET3 Anemia work up Recent Labs    02/25/20 0349 02/26/20 0411  FERRITIN 1,050* 1,177*   Urinalysis    Component Value Date/Time  COLORURINE YELLOW 02/13/2020 1800   APPEARANCEUR CLEAR 02/13/2020 1800   LABSPEC 1.029 02/13/2020 1800   PHURINE 5.0 02/13/2020 1800   GLUCOSEU NEGATIVE 02/13/2020 1800   HGBUR NEGATIVE 02/13/2020 1800   BILIRUBINUR NEGATIVE 02/13/2020 1800   KETONESUR NEGATIVE 02/13/2020 1800   PROTEINUR NEGATIVE 02/13/2020 1800   NITRITE NEGATIVE 02/13/2020 1800   LEUKOCYTESUR NEGATIVE 02/13/2020 1800   Sepsis Labs Invalid input(s): PROCALCITONIN,  WBC,  LACTICIDVEN Microbiology No results found for this or any previous visit (from the past 240 hour(s)).   Time coordinating discharge: 45 minutes  SIGNED:   Coralie KeensMauricio Daniel Hershy Flenner, MD  [facility] 02/26/2020, 9:06 AM

## 2020-08-10 ENCOUNTER — Telehealth (HOSPITAL_COMMUNITY): Payer: Self-pay

## 2020-08-10 NOTE — Telephone Encounter (Signed)
Called and spoke with pt in regards to PR, pt stated he is interested. 

## 2020-08-13 ENCOUNTER — Telehealth (HOSPITAL_COMMUNITY): Payer: Self-pay

## 2020-08-13 NOTE — Telephone Encounter (Signed)
Pt insurance is active and benefits verified through [employer] Co-pay 0, DED $600/$0 met, out of pocket $1,800/$150 met, co-insurance 20%. no pre-authorization required. Passport, JoseM/[employer] 08/13/2020_0 :38am, REF# [id number]

## 2020-08-16 ENCOUNTER — Encounter (HOSPITAL_COMMUNITY): Payer: Self-pay | Admitting: *Deleted

## 2020-08-16 NOTE — Progress Notes (Signed)
Received referral from Dr. Su Monks with [facility] for this pt to participate in pulmonary rehab with the the diagnosis of Bronchiectasis. Clinical review of pt follow up appt on 3/14  Pulmonary office note. Pt with Covid admission in 04/2020 Pt with Covid Risk Score - 4. Pt appropriate for scheduling for Pulmonary rehab.  Will forward to support staff for scheduling. Alanson Aly, BSN Cardiac and Emergency planning/management officer

## 2020-08-27 NOTE — Telephone Encounter (Signed)
Pt wife called and was informing about pt pulmonary rehab referral advised pt wife that we have his referral and that we have a backlog right now and advised pt wife that we had just received his referral information from [facility] her where we was scheduling.

## 2020-10-04 ENCOUNTER — Telehealth (HOSPITAL_COMMUNITY): Payer: Self-pay

## 2020-10-04 ENCOUNTER — Ambulatory Visit (INDEPENDENT_AMBULATORY_CARE_PROVIDER_SITE_OTHER): Payer: BC Managed Care – PPO | Admitting: Nurse Practitioner

## 2020-10-04 ENCOUNTER — Ambulatory Visit: Payer: BC Managed Care – PPO

## 2020-10-04 ENCOUNTER — Other Ambulatory Visit: Payer: Self-pay

## 2020-10-04 VITALS — BP 163/87 | HR 103 | Temp 97.9°F | Resp 18

## 2020-10-04 DIAGNOSIS — J9611 Chronic respiratory failure with hypoxia: Secondary | ICD-10-CM | POA: Diagnosis not present

## 2020-10-04 DIAGNOSIS — R413 Other amnesia: Secondary | ICD-10-CM

## 2020-10-04 DIAGNOSIS — Z8616 Personal history of COVID-19: Secondary | ICD-10-CM

## 2020-10-04 NOTE — Telephone Encounter (Signed)
Called patient to see if he was interested in participating in the Pulmonary Rehab Program. Patient stated yes. Patient will come in for orientation on 10/05/20 @130PM  and will attend the 1:15PM exercise class.  .

## 2020-10-04 NOTE — Progress Notes (Signed)
@Patient  ID: , male    DOB: 05-14-1960, 60 y.o.   MRN: [id number]  Chief Complaint  Patient presents with  . history of covid    Referring provider: [id number], MD  HPI  Patient presents today for post-COVID care clinic visit.  Patient tested positive for COVID in October 2021.  He was hospitalized at that time.  Overall he states that he is much improved but still has issues with shortness of breath with exertion, myalgias, arthralgias memory loss.  He has been followed by Dr. [facility] has referred him to a pulmonary rehab program with [facility].  He is currently on Flovent and albuterol.  He was noted to have restrictive lung disease and elevated hemidiaphragm.  Does still require oxygen at 2 L with exertion.  His pulmonologist has referred him to a pulmonary rehab program but there is a wait list for this.  We discussed that he can check to see how long the wait list he has and if it is several months out we can go ahead and get him in with physical therapy through our neuro rehab department.  We can also try to get him in with speech therapy for cognitive rehabilitation.  We discussed that he can get a second opinion with either Dr. [facility] or Dr. Isaiah Serge as well.  Denies f/c/s, n/v/d, hemoptysis, PND, chest pain or edema.      No Known Allergies   There is no immunization history on file for this patient.  Past Medical History:  Diagnosis Date  . Hypertension     Tobacco History: Social History   Tobacco Use  Smoking Status Never Smoker  Smokeless Tobacco Never Used   Counseling given: Yes   Outpatient Encounter Medications as of 10/04/2020  Medication Sig  . acetaminophen (TYLENOL) 500 MG tablet Take 500 mg by mouth every 6 (six) hours as needed for fever.  12/04/2020 atorvastatin (LIPITOR) 10 MG tablet Take 10 mg by mouth daily.  Marland Kitchen bismuth subsalicylate (PEPTO BISMOL) 262 MG/15ML suspension Take 30 mLs by mouth every 6 (six) hours as needed for  indigestion.  Marland Kitchen guaiFENesin-dextromethorphan (ROBITUSSIN DM) 100-10 MG/5ML syrup Take 5 mLs by mouth every 6 (six) hours as needed for cough.  . losartan-hydrochlorothiazide (HYZAAR) 100-12.5 MG tablet Take 1 tablet by mouth daily.   No facility-administered encounter medications on file as of 10/04/2020.     Review of Systems  Review of Systems  Constitutional: Negative.  Negative for fatigue and fever.  HENT: Negative.   Respiratory: Positive for shortness of breath. Negative for cough.   Cardiovascular: Negative.  Negative for chest pain, palpitations and leg swelling.  Gastrointestinal: Negative.   Allergic/Immunologic: Negative.   Neurological: Negative.   Psychiatric/Behavioral: Positive for confusion and decreased concentration.       Physical Exam  BP (!) 163/87   Pulse (!) 103   Temp 97.9 F (36.6 C)   Resp 18   SpO2 95%   Wt Readings from Last 5 Encounters:  02/13/20 240 lb 15.4 oz (109.3 kg)  11/09/17 265 lb (120.2 kg)     Physical Exam Vitals and nursing note reviewed.  Constitutional:      General: He is not in acute distress.    Appearance: He is well-developed.  Cardiovascular:     Rate and Rhythm: Normal rate and regular rhythm.  Pulmonary:     Effort: Pulmonary effort is normal.     Breath sounds: Normal breath sounds.  Musculoskeletal:  Right lower leg: No edema.     Left lower leg: No edema.  Skin:    General: Skin is warm and dry.  Neurological:     Mental Status: He is alert and oriented to person, place, and time.  Psychiatric:        Mood and Affect: Mood normal.        Behavior: Behavior normal.      Lab Results:  CBC    Component Value Date/Time   WBC 24.4 (H) 02/24/2020 0348   RBC 4.80 02/24/2020 0348   HGB 14.0 02/24/2020 0348   HCT 42.9 02/24/2020 0348   PLT 264 02/24/2020 0348   MCV 89.4 02/24/2020 0348   MCH 29.2 02/24/2020 0348   MCHC 32.6 02/24/2020 0348   RDW 15.3 02/24/2020 0348   LYMPHSABS 1.9 02/24/2020  0348   MONOABS 1.8 (H) 02/24/2020 0348   EOSABS 0.0 02/24/2020 0348   BASOSABS 0.0 02/24/2020 0348    BMET    Component Value Date/Time   NA 136 02/26/2020 0411   K 5.0 02/26/2020 0411   CL 100 02/26/2020 0411   CO2 24 02/26/2020 0411   GLUCOSE 174 (H) 02/26/2020 0411   BUN 24 (H) 02/26/2020 0411   CREATININE 0.72 02/26/2020 0411   CALCIUM 9.1 02/26/2020 0411   GFRNONAA >60 02/26/2020 0411   GFRAA >60 11/09/2017 0952    BNP No results found for: BNP  ProBNP No results found for: PROBNP  Imaging: No results found.   Assessment & Plan:   History of COVID-19 Chronic respiratory failure Physical deconditioning:   Stay well hydrated  Stay active  Deep breathing exercises  May take tylenol for fever or pain  Will place referral to pulmonary - second opinion  Scheduled for pulmonary rehab, but there is a wait list - please call if you would like to get in for PT   Memory loss Brain fog:  Will place referral to speech therapy for cognitive rehabilitation    Follow up:  Follow up in 1 month or sooner if needed      Ivonne Andrew, NP 10/04/2020

## 2020-10-04 NOTE — Patient Instructions (Signed)
History of Covid 19 Chronic respiratory failure Physical deconditioning:   Stay well hydrated  Stay active  Deep breathing exercises  May take tylenol for fever or pain  Will place referral to pulmonary - second opinion  Scheduled for pulmonary rehab, but there is a wait list - please call if you would like to get in for PT   Memory loss Brain fog:  Will place referral to speech therapy for cognitive rehabilitation    Follow up:  Follow up in 1 month or sooner if needed

## 2020-10-04 NOTE — Assessment & Plan Note (Signed)
Chronic respiratory failure Physical deconditioning:   Stay well hydrated  Stay active  Deep breathing exercises  May take tylenol for fever or pain  Will place referral to pulmonary - second opinion  Scheduled for pulmonary rehab, but there is a wait list - please call if you would like to get in for PT   Memory loss Brain fog:  Will place referral to speech therapy for cognitive rehabilitation    Follow up:  Follow up in 1 month or sooner if needed

## 2020-10-05 ENCOUNTER — Encounter (HOSPITAL_COMMUNITY)
Admission: RE | Admit: 2020-10-05 | Discharge: 2020-10-05 | Disposition: A | Discharge status: Home / Self Care | Payer: BC Managed Care – PPO | Source: Ambulatory Visit | Attending: Cardiology | Admitting: Cardiology

## 2020-10-05 ENCOUNTER — Encounter (HOSPITAL_COMMUNITY): Payer: Self-pay

## 2020-10-05 VITALS — BP 124/86 | HR 95 | Ht 70.25 in | Wt 266.3 lb

## 2020-10-05 DIAGNOSIS — J470 Bronchiectasis with acute lower respiratory infection: Secondary | ICD-10-CM | POA: Insufficient documentation

## 2020-10-05 NOTE — Progress Notes (Signed)
Darren Wood 60 y.o. male Pulmonary Rehab Orientation Note This patient who was referred to Pulmonary rehab by Dr. Su Monks with the diagnosis of Bronchiectasis arrived today in Cardiac and Pulmonary Rehab. He arrived ambulatory using a cane for assistance with a left sided limp due to chronic knee and hip pain. He does carry portable oxygen. Per pt, he uses oxygen with exercise. Color good, skin warm and dry. Patient is oriented to time and place. Patient's medical history, psychosocial health, and medications reviewed. Psychosocial assessment reveals pt lives with their spouse. Pt is currently working full time in the transportation industry. Pt hobbies include fishing and coaching his grandkids with sports. Pt reports his stress level is low. Areas of stress/anxiety include Health. Pt does not exhibit signs of depression. PHQ2/9 score 1/2. Pt shows good coping skills with positive outlook. Darren Wood was offered emotional support and reassurance. Will continue to monitor and evaluate progress toward psychosocial goal(s) of mental well being and improved quality of life. Patient reports he does take medications as prescribed. Patient states he follows a Regular diet. The patient reports no specific efforts to gain or lose weight. Patient's weight will be monitored closely. Demonstration and practice of PLB using pulse oximeter. Patient able to return demonstration satisfactorily. Safety and hand hygiene in the exercise area reviewed with patient. Patient voices understanding of the information reviewed. Department expectations discussed with patient and achievable goals were set. The patient shows enthusiasm about attending the program and we look forward to working with this nice gentleman. The patient completed a 6 min walk test today and is scheduled to begin exercise on 10/09/20.  45 minutes was spent on a variety of activities such as assessment of the patient, obtaining baseline data including height, weight, BMI,  and grip strength, verifying medical history, allergies, and current medications, and teaching patient strategies for performing tasks with less respiratory effort with emphasis on pursed lip breathing.  Norris Cross MS, ACSM CEP

## 2020-10-05 NOTE — Progress Notes (Signed)
Pulmonary Rehab Orientation Physical Assessment Note  Physical assessment reveals  Pt is alert and oriented x 3.  Heart rate is normal, breath sounds diminished throughout. Reports productive cough with poor breathing day. Bowel sounds present x 4  Pt denies abdominal discomfort, nausea, vomiting or diarrhea. Grip strength equal, strong. Distal pulses palpable with no  swelling to lower extremities. Darren Wood is looking forward to participating in Pulmonary Rehab. Alanson Aly, BSN Cardiac and Emergency planning/management officer

## 2020-10-05 NOTE — Progress Notes (Signed)
Pulmonary Individual Treatment Plan  Patient Details  Name: Wardell Pokorski MRN: [id number] Date of Birth: 1961-01-28 Referring Provider:   Doristine Devoid Pulmonary Rehab Walk Test from 10/05/2020 in [facility]  Referring Provider Dr. Su Monks      Initial Encounter Date:  Flowsheet Row Pulmonary Rehab Walk Test from 10/05/2020 in [facility]  Date 10/05/20      Visit Diagnosis: Bronchiectasis with acute lower respiratory infection (HCC)  Patient's Home Medications on Admission:   Current Outpatient Medications:  .  losartan-hydrochlorothiazide (HYZAAR) 100-12.5 MG tablet, Take 1 tablet by mouth daily., Disp: , Rfl:  .  acetaminophen (TYLENOL) 500 MG tablet, Take 500 mg by mouth every 6 (six) hours as needed for fever. (Patient not taking: Reported on 10/05/2020), Disp: , Rfl:  .  atorvastatin (LIPITOR) 10 MG tablet, Take 10 mg by mouth daily. (Patient not taking: Reported on 10/05/2020), Disp: , Rfl:  .  bismuth subsalicylate (PEPTO BISMOL) 262 MG/15ML suspension, Take 30 mLs by mouth every 6 (six) hours as needed for indigestion. (Patient not taking: Reported on 10/05/2020), Disp: , Rfl:  .  guaiFENesin-dextromethorphan (ROBITUSSIN DM) 100-10 MG/5ML syrup, Take 5 mLs by mouth every 6 (six) hours as needed for cough. (Patient not taking: Reported on 10/05/2020), Disp: 118 mL, Rfl: 0  Past Medical History: Past Medical History:  Diagnosis Date  . Hypertension     Tobacco Use: Social History   Tobacco Use  Smoking Status Never Smoker  Smokeless Tobacco Never Used    Labs: Recent Review Advice worker    Labs for ITP Cardiac and Pulmonary Rehab Latest Ref Rng & Units 02/13/2020   Trlycerides <150 mg/dL 295(M)      Capillary Blood Glucose: No results found for: GLUCAP   Pulmonary Assessment Scores:  Pulmonary Assessment Scores    Row Name 10/05/20 1530         ADL UCSD   ADL Phase Entry     SOB Score total 46            CAT Score   CAT Score 17           mMRC Score   mMRC Score 4           UCSD: Self-administered rating of dyspnea associated with activities of daily living (ADLs) 6-point scale (0 = "not at all" to 5 = "maximal or unable to do because of breathlessness")  Scoring Scores range from 0 to 120.  Minimally important difference is 5 units  CAT: CAT can identify the health impairment of COPD patients and is better correlated with disease progression.  CAT has a scoring range of zero to 40. The CAT score is classified into four groups of low (less than 10), medium (10 - 20), high (21-30) and very high (31-40) based on the impact level of disease on health status. A CAT score over 10 suggests significant symptoms.  A worsening CAT score could be explained by an exacerbation, poor medication adherence, poor inhaler technique, or progression of COPD or comorbid conditions.  CAT MCID is 2 points  mMRC: mMRC (Modified Medical Research Council) Dyspnea Scale is used to assess the degree of baseline functional disability in patients of respiratory disease due to dyspnea. No minimal important difference is established. A decrease in score of 1 point or greater is considered a positive change.   Pulmonary Function Assessment:  Pulmonary Function Assessment - 10/05/20 1408      Breath  Shortness of Breath Yes;Limiting activity           Exercise Target Goals: Exercise Program Goal: Individual exercise prescription set using results from initial 6 min walk test and THRR while considering  patient's activity barriers and safety.   Exercise Prescription Goal: Initial exercise prescription builds to 30-45 minutes a day of aerobic activity, 2-3 days per week.  Home exercise guidelines will be given to patient during program as part of exercise prescription that the participant will acknowledge.  Activity Barriers & Risk Stratification:  Activity Barriers & Cardiac Risk Stratification -  10/05/20 1402      Activity Barriers & Cardiac Risk Stratification   Activity Barriers Left Hip Replacement;Shortness of Breath;Arthritis           6 Minute Walk:  6 Minute Walk    Row Name 10/05/20 1518         6 Minute Walk   Phase Initial     Distance 800 feet     Walk Time 6 minutes     # of Rest Breaks 0     MPH 1.52     METS 2.29     RPE 12     Perceived Dyspnea  1     VO2 Peak 8.03     Symptoms Yes (comment)     Comments Hip and knee pain 5/10-chronic     Resting HR 91 bpm     Resting BP 124/86     Resting Oxygen Saturation  98 %     Exercise Oxygen Saturation  during 6 min walk 92 %     Max Ex. HR 132 bpm     Max Ex. BP 144/80     2 Minute Post BP 132/82           Interval HR   1 Minute HR 123     2 Minute HR 124     3 Minute HR 125     4 Minute HR 130     5 Minute HR 130     6 Minute HR 132     2 Minute Post HR 109     Interval Heart Rate? Yes           Interval Oxygen   Interval Oxygen? Yes     Baseline Oxygen Saturation % 98 %     1 Minute Oxygen Saturation % 93 %     1 Minute Liters of Oxygen 2 L     2 Minute Oxygen Saturation % 96 %     2 Minute Liters of Oxygen 2 L     3 Minute Oxygen Saturation % 96 %     3 Minute Liters of Oxygen 2 L     4 Minute Oxygen Saturation % 94 %     4 Minute Liters of Oxygen 2 L     5 Minute Oxygen Saturation % 95 %     5 Minute Liters of Oxygen 2 L     6 Minute Oxygen Saturation % 92 %     6 Minute Liters of Oxygen 2 L     2 Minute Post Oxygen Saturation % 99 %     2 Minute Post Liters of Oxygen 2 L            Oxygen Initial Assessment:  Oxygen Initial Assessment - 10/05/20 1406      Home Oxygen   Home Oxygen Device Portable Concentrator;Home Concentrator    Sleep Oxygen Prescription  None    Home Exercise Oxygen Prescription Pulsed    Liters per minute 3    Home Resting Oxygen Prescription None    Compliance with Home Oxygen Use Yes      Initial 6 min Walk   Oxygen Used Continuous    Liters  per minute 2      Program Oxygen Prescription   Program Oxygen Prescription Continuous    Liters per minute 2      Intervention   Short Term Goals To learn and exhibit compliance with exercise, home and travel O2 prescription;To learn and understand importance of monitoring SPO2 with pulse oximeter and demonstrate accurate use of the pulse oximeter.;To learn and understand importance of maintaining oxygen saturations>88%;To learn and demonstrate proper pursed lip breathing techniques or other breathing techniques.;To learn and demonstrate proper use of respiratory medications    Long  Term Goals Exhibits compliance with exercise, home and travel O2 prescription;Verbalizes importance of monitoring SPO2 with pulse oximeter and return demonstration;Maintenance of O2 saturations>88%;Exhibits proper breathing techniques, such as pursed lip breathing or other method taught during program session;Compliance with respiratory medication;Demonstrates proper use of MDI's           Oxygen Re-Evaluation:   Oxygen Discharge (Final Oxygen Re-Evaluation):   Initial Exercise Prescription:  Initial Exercise Prescription - 10/05/20 1500      Date of Initial Exercise RX and Referring Provider   Date 10/05/20    Referring Provider Dr. Su Monks    Expected Discharge Date 12/06/20      Oxygen   Oxygen Continuous    Liters 2      NuStep   Level 1    SPM 80    Minutes 15      Arm Ergometer   Level 1    Minutes 15      Intensity   THRR 40-80% of Max Heartrate 63-126    Ratings of Perceived Exertion 11-13    Perceived Dyspnea 0-4      Progression   Progression Continue to progress workloads to maintain intensity without signs/symptoms of physical distress.      Resistance Training   Training Prescription Yes    Weight Blue bands    Reps 10-15           Perform Capillary Blood Glucose checks as needed.  Exercise Prescription Changes:   Exercise Comments:   Exercise Goals and  Review:  Exercise Goals    Row Name 10/05/20 1515             Exercise Goals   Increase Physical Activity Yes       Intervention Provide advice, education, support and counseling about physical activity/exercise needs.;Develop an individualized exercise prescription for aerobic and resistive training based on initial evaluation findings, risk stratification, comorbidities and participant's personal goals.       Expected Outcomes Short Term: Attend rehab on a regular basis to increase amount of physical activity.;Long Term: Add in home exercise to make exercise part of routine and to increase amount of physical activity.;Long Term: Exercising regularly at least 3-5 days a week.       Increase Strength and Stamina Yes       Intervention Provide advice, education, support and counseling about physical activity/exercise needs.;Develop an individualized exercise prescription for aerobic and resistive training based on initial evaluation findings, risk stratification, comorbidities and participant's personal goals.       Expected Outcomes Short Term: Increase workloads from initial exercise prescription for resistance, speed, and METs.;Short  Term: Perform resistance training exercises routinely during rehab and add in resistance training at home;Long Term: Improve cardiorespiratory fitness, muscular endurance and strength as measured by increased METs and functional capacity ( )       Able to understand and use rate of perceived exertion (RPE) scale Yes       Intervention Provide education and explanation on how to use RPE scale       Expected Outcomes Short Term: Able to use RPE daily in rehab to express subjective intensity level;Long Term:  Able to use RPE to guide intensity level when exercising independently       Able to understand and use Dyspnea scale Yes       Intervention Provide education and explanation on how to use Dyspnea scale       Expected Outcomes Short Term: Able to use Dyspnea  scale daily in rehab to express subjective sense of shortness of breath during exertion;Long Term: Able to use Dyspnea scale to guide intensity level when exercising independently       Knowledge and understanding of Target Heart Rate Range (THRR) Yes       Intervention Provide education and explanation of THRR including how the numbers were predicted and where they are located for reference       Expected Outcomes Short Term: Able to state/look up THRR;Long Term: Able to use THRR to govern intensity when exercising independently;Short Term: Able to use daily as guideline for intensity in rehab       Understanding of Exercise Prescription Yes       Intervention Provide education, explanation, and written materials on patient's individual exercise prescription       Expected Outcomes Short Term: Able to explain program exercise prescription;Long Term: Able to explain home exercise prescription to exercise independently              Exercise Goals Re-Evaluation :   Discharge Exercise Prescription (Final Exercise Prescription Changes):   Nutrition:  Target Goals: Understanding of nutrition guidelines, daily intake of sodium 1500mg , cholesterol 200mg , calories 30% from fat and 7% or less from saturated fats, daily to have 5 or more servings of fruits and vegetables.  Biometrics:  Pre Biometrics - 10/05/20 1515      Pre Biometrics   Grip Strength 37 kg            Nutrition Therapy Plan and Nutrition Goals:   Nutrition Assessments:  MEDIFICTS Score Key:  ?70 Need to make dietary changes   40-70 Heart Healthy Diet  ? 40 Therapeutic Level Cholesterol Diet   Picture Your Plate Scores:  <25 Unhealthy dietary pattern with much room for improvement.  41-50 Dietary pattern unlikely to meet recommendations for good health and room for improvement.  51-60 More healthful dietary pattern, with some room for improvement.   >60 Healthy dietary pattern, although there may be some  specific behaviors that could be improved.    Nutrition Goals Re-Evaluation:   Nutrition Goals Discharge (Final Nutrition Goals Re-Evaluation):   Psychosocial: Target Goals: Acknowledge presence or absence of significant depression and/or stress, maximize coping skills, provide positive support system. Participant is able to verbalize types and ability to use techniques and skills needed for reducing stress and depression.  Initial Review & Psychosocial Screening:  Initial Psych Review & Screening - 10/05/20 1409      Initial Review   Current issues with None Identified      Family Dynamics   Good Support System? Yes   Wife,  daughter, son, sister, and church family     Barriers   Psychosocial barriers to participate in program The patient should benefit from training in stress management and relaxation.      Screening Interventions   Interventions Encouraged to exercise    Expected Outcomes Long Term Goal: Stressors or current issues are controlled or eliminated.;Long Term goal: The participant improves quality of Life and PHQ9 Scores as seen by post scores and/or verbalization of changes           Quality of Life Scores:  Scores of 19 and below usually indicate a poorer quality of life in these areas.  A difference of  2-3 points is a clinically meaningful difference.  A difference of 2-3 points in the total score of the Quality of Life Index has been associated with significant improvement in overall quality of life, self-image, physical symptoms, and general health in studies assessing change in quality of life.  PHQ-9: Recent Review Flowsheet Data    Depression screen [facility] 2/9 10/05/2020   Decreased Interest 0   Down, Depressed, Hopeless 1   PHQ - 2 Score 1   Altered sleeping 0   Tired, decreased energy 2   Change in appetite 0   Feeling bad or failure about yourself  0   Trouble concentrating 0   Moving slowly or fidgety/restless 0   Suicidal thoughts 0   Difficult  doing work/chores Not difficult at all     Interpretation of Total Score  Total Score Depression Severity:  1-4 = Minimal depression, 5-9 = Mild depression, 10-14 = Moderate depression, 15-19 = Moderately severe depression, 20-27 = Severe depression   Psychosocial Evaluation and Intervention:  Psychosocial Evaluation - 10/05/20 1521      Psychosocial Evaluation & Interventions   Interventions Encouraged to exercise with the program and follow exercise prescription    Comments Patient does not show any psychosocial barriers and seems to have a positive outlook on life. He has a great support system that consists of family and church members.    Expected Outcomes Pt to continue positive outlook and continued mental well being through healthy coping mechanisms    Continue Psychosocial Services  No Follow up required           Psychosocial Re-Evaluation:   Psychosocial Discharge (Final Psychosocial Re-Evaluation):   Education: Education Goals: Education classes will be provided on a weekly basis, covering required topics. Participant will state understanding/return demonstration of topics presented.  Learning Barriers/Preferences:  Learning Barriers/Preferences - 10/05/20 1410      Learning Barriers/Preferences   Learning Barriers None   Wears reading glasses   Learning Preferences Written Material;Skilled Demonstration;Individual Instruction           Education Topics: Risk Factor Reduction:  -Group instruction that is supported by a PowerPoint presentation. Instructor discusses the definition of a risk factor, different risk factors for pulmonary disease, and how the heart and lungs work together.     Nutrition for Pulmonary Patient:  -Group instruction provided by PowerPoint slides, verbal discussion, and written materials to support subject matter. The instructor gives an explanation and review of healthy diet recommendations, which includes a discussion on weight  management, recommendations for fruit and vegetable consumption, as well as protein, fluid, caffeine, fiber, sodium, sugar, and alcohol. Tips for eating when patients are short of breath are discussed.   Pursed Lip Breathing:  -Group instruction that is supported by demonstration and informational handouts. Instructor discusses the benefits of pursed lip and  diaphragmatic breathing and detailed demonstration on how to preform both.     Oxygen Safety:  -Group instruction provided by PowerPoint, verbal discussion, and written material to support subject matter. There is an overview of "What is Oxygen" and "Why do we need it".  Instructor also reviews how to create a safe environment for oxygen use, the importance of using oxygen as prescribed, and the risks of noncompliance. There is a brief discussion on traveling with oxygen and resources the patient may utilize.   Oxygen Equipment:  -Group instruction provided by [facility] utilizing handouts, written materials, and equipment demonstrations.   Signs and Symptoms:  -Group instruction provided by written material and verbal discussion to support subject matter. Warning signs and symptoms of infection, stroke, and heart attack are reviewed and when to call the physician/911 reinforced. Tips for preventing the spread of infection discussed.   Advanced Directives:  -Group instruction provided by verbal instruction and written material to support subject matter. Instructor reviews Advanced Directive laws and proper instruction for filling out document.   Pulmonary Video:  -Group video education that reviews the importance of medication and oxygen compliance, exercise, good nutrition, pulmonary hygiene, and pursed lip and diaphragmatic breathing for the pulmonary patient.   Exercise for the Pulmonary Patient:  -Group instruction that is supported by a PowerPoint presentation. Instructor discusses benefits of exercise, core components of  exercise, frequency, duration, and intensity of an exercise routine, importance of utilizing pulse oximetry during exercise, safety while exercising, and options of places to exercise outside of rehab.     Pulmonary Medications:  -Verbally interactive group education provided by instructor with focus on inhaled medications and proper administration.   Anatomy and Physiology of the Respiratory System and Intimacy:  -Group instruction provided by PowerPoint, verbal discussion, and written material to support subject matter. Instructor reviews respiratory cycle and anatomical components of the respiratory system and their functions. Instructor also reviews differences in obstructive and restrictive respiratory diseases with examples of each. Intimacy, Sex, and Sexuality differences are reviewed with a discussion on how relationships can change when diagnosed with pulmonary disease. Common sexual concerns are reviewed.   MD DAY -A group question and answer session with a medical doctor that allows participants to ask questions that relate to their pulmonary disease state.   OTHER EDUCATION -Group or individual verbal, written, or video instructions that support the educational goals of the pulmonary rehab program.   Holiday Eating Survival Tips:  -Group instruction provided by PowerPoint slides, verbal discussion, and written materials to support subject matter. The instructor gives patients tips, tricks, and techniques to help them not only survive but enjoy the holidays despite the onslaught of food that accompanies the holidays.   Knowledge Questionnaire Score:  Knowledge Questionnaire Score - 10/05/20 1528      Knowledge Questionnaire Score   Pre Score 5/8   Did not answer questions on back of sheet          Core Components/Risk Factors/Patient Goals at Admission:  Personal Goals and Risk Factors at Admission - 10/05/20 1524      Core Components/Risk Factors/Patient Goals on  Admission   Improve shortness of breath with ADL's Yes    Intervention Provide education, individualized exercise plan and daily activity instruction to help decrease symptoms of SOB with activities of daily living.    Expected Outcomes Short Term: Improve cardiorespiratory fitness to achieve a reduction of symptoms when performing ADLs;Long Term: Be able to perform more ADLs without symptoms or  delay the onset of symptoms    Hypertension Yes    Intervention Provide education on lifestyle modifcations including regular physical activity/exercise, weight management, moderate sodium restriction and increased consumption of fresh fruit, vegetables, and low fat dairy, alcohol moderation, and smoking cessation.;Monitor prescription use compliance.    Expected Outcomes Short Term: Continued assessment and intervention until BP is < 140/27mm HG in hypertensive participants. < 130/47mm HG in hypertensive participants with diabetes, heart failure or chronic kidney disease.;Long Term: Maintenance of blood pressure at goal levels.           Core Components/Risk Factors/Patient Goals Review:    Core Components/Risk Factors/Patient Goals at Discharge (Final Review):    ITP Comments:   Comments:

## 2020-10-09 ENCOUNTER — Encounter (HOSPITAL_COMMUNITY)
Admission: RE | Admit: 2020-10-09 | Discharge: 2020-10-09 | Disposition: A | Discharge status: Home / Self Care | Payer: BC Managed Care – PPO | Source: Ambulatory Visit | Attending: Cardiology | Admitting: Cardiology

## 2020-10-09 ENCOUNTER — Other Ambulatory Visit: Payer: Self-pay

## 2020-10-09 DIAGNOSIS — J470 Bronchiectasis with acute lower respiratory infection: Secondary | ICD-10-CM

## 2020-10-09 NOTE — Progress Notes (Signed)
Daily Session Note  Patient Details  Name: Darren Wood MRN: [id number] Date of Birth: [birth year] Referring Provider:   April Manson Pulmonary Rehab Walk Test from 10/05/2020 in [place]  Referring Provider Dr. Camillo Flaming      Encounter Date: 10/09/2020  Check In:  Session Check In - 10/09/20 1355      Check-In   Supervising physician immediately available to respond to emergencies [facility] immediately available    Physician(s) Dr. Tana Coast    Location [facility]    Staff Present Rodney Langton, RN;Carlette Wilber Oliphant, RN, BSN;Ramon Dredge, RN, MHA;Darsh Vandevoort Hassell Done, MS, ACSM-CEP, Exercise Physiologist;Other    Virtual Visit No    Medication changes reported     No    Fall or balance concerns reported    No    Tobacco Cessation No Change    Warm-up and Cool-down Performed as group-led instruction    Resistance Training Performed Yes    VAD Patient? No    PAD/SET Patient? No      Pain Assessment   Currently in Pain? No/denies    Pain Score 0-No pain    Multiple Pain Sites No           Capillary Blood Glucose: No results found for this or any previous visit (from the past 24 hour(s)).    Social History   Tobacco Use  Smoking Status Never Smoker  Smokeless Tobacco Never Used    Goals Met:  Proper associated with RPD/PD & O2 Sat Exercise tolerated well No report of cardiac concerns or symptoms Strength training completed today  Goals Unmet:  Not Applicable  Comments: Service time is from 1315 to 1435    Dr. Fransico Him is Medical Director for Cardiac Rehab at [facility].

## 2020-10-11 ENCOUNTER — Encounter (HOSPITAL_COMMUNITY)
Admission: RE | Admit: 2020-10-11 | Discharge: 2020-10-11 | Disposition: A | Discharge status: Home / Self Care | Payer: BC Managed Care – PPO | Source: Ambulatory Visit | Attending: Cardiology | Admitting: Cardiology

## 2020-10-11 ENCOUNTER — Other Ambulatory Visit: Payer: Self-pay

## 2020-10-11 DIAGNOSIS — J470 Bronchiectasis with acute lower respiratory infection: Secondary | ICD-10-CM

## 2020-10-11 NOTE — Progress Notes (Signed)
Daily Session Note  Patient Details  Name: Darren Wood MRN: [id number] Date of Birth: 12/18/1960 Referring Provider:   April Manson Pulmonary Rehab Walk Test from 10/05/2020 in [place]  Referring Provider Dr. Camillo Flaming       Encounter Date: 10/11/2020  Check In:  Session Check In - 10/11/20 1444       Check-In   Supervising physician immediately available to respond to emergencies [facility] immediately available    Physician(s) Dr. Lonny Prude    Location [facility]    Staff Present Leda Roys, MS, ACSM-CEP, Exercise Physiologist;Other;Camisha Srey Ann Lions, RN, BSN    Virtual Visit No    Medication changes reported     No    Fall or balance concerns reported    No    Tobacco Cessation No Change    Warm-up and Cool-down Performed as group-led instruction    Resistance Training Performed Yes    VAD Patient? No    PAD/SET Patient? No      Pain Assessment   Currently in Pain? No/denies    Pain Score 0-No pain    Multiple Pain Sites No             Capillary Blood Glucose: No results found for this or any previous visit (from the past 24 hour(s)).    Social History   Tobacco Use  Smoking Status Never  Smokeless Tobacco Never    Goals Met:  Exercise tolerated well No report of cardiac concerns or symptoms Strength training completed today  Goals Unmet:  Not Applicable  Comments: Service time is from 1315 to 1434    Dr. Fransico Him is Medical Director for Cardiac Rehab at [facility].

## 2020-10-16 ENCOUNTER — Other Ambulatory Visit: Payer: Self-pay

## 2020-10-16 ENCOUNTER — Encounter (HOSPITAL_COMMUNITY)
Admission: RE | Admit: 2020-10-16 | Discharge: 2020-10-16 | Disposition: A | Discharge status: Home / Self Care | Payer: BC Managed Care – PPO | Source: Ambulatory Visit | Attending: Cardiology | Admitting: Cardiology

## 2020-10-16 VITALS — Wt 267.0 lb

## 2020-10-16 DIAGNOSIS — J470 Bronchiectasis with acute lower respiratory infection: Secondary | ICD-10-CM

## 2020-10-16 NOTE — Progress Notes (Signed)
Daily Session Note  Patient Details  Name: Darren Wood MRN: [id number] Date of Birth: Mar 01, 1961 Referring Provider:   April Manson Pulmonary Rehab Walk Test from 10/05/2020 in [place]  Referring Provider Dr. Camillo Flaming       Encounter Date: 10/16/2020  Check In:  Session Check In - 10/16/20 1407       Check-In   Supervising physician immediately available to respond to emergencies [facility] immediately available    Physician(s) Dr. Lonny Prude    Location [facility]    Staff Present Rosebud Poles, RN, BSN;Lisa Ysidro Evert, RN;Jessica Hassell Done, MS, ACSM-CEP, Exercise Physiologist    Virtual Visit No    Medication changes reported     No    Fall or balance concerns reported    No    Tobacco Cessation No Change    Warm-up and Cool-down Performed as group-led instruction    Resistance Training Performed Yes    VAD Patient? No    PAD/SET Patient? No      Pain Assessment   Currently in Pain? No/denies    Multiple Pain Sites No             Capillary Blood Glucose: No results found for this or any previous visit (from the past 24 hour(s)).   Exercise Prescription Changes - 10/16/20 1400       Response to Exercise   Blood Pressure (Admit) 140/80    Blood Pressure (Exercise) 154/82    Blood Pressure (Exit) 126/84    Heart Rate (Admit) 94 bpm    Heart Rate (Exercise) 111 bpm    Heart Rate (Exit) 105 bpm    Oxygen Saturation (Admit) 99 %    Oxygen Saturation (Exercise) 111 %    Oxygen Saturation (Exit) 105 %    Rating of Perceived Exertion (Exercise) 11    Perceived Dyspnea (Exercise) 0    Duration Continue with 30 min of aerobic exercise without signs/symptoms of physical distress.    Intensity THRR unchanged      Progression   Progression Continue to progress workloads to maintain intensity without signs/symptoms of physical distress.    Average METs 1.8      Resistance Training   Training Prescription Yes    Weight  Blue bands    Reps 10-15    Time 10 Minutes      Oxygen   Oxygen Continuous    Liters 2      NuStep   Level 3    SPM 80    Minutes 30    METs 1.8             Social History   Tobacco Use  Smoking Status Never  Smokeless Tobacco Never    Goals Met:  Exercise tolerated well No report of cardiac concerns or symptoms Strength training completed today  Goals Unmet:  Not Applicable  Comments: Service time is from 1318 to 1435   Dr. Fransico Him is Medical Director for Cardiac Rehab at [facility].

## 2020-10-17 NOTE — Progress Notes (Signed)
Darren Wood 60 y.o. male Nutrition Note  Diagnosis:Bronchiectasis   Past Medical History:  Diagnosis Date   Hypertension      Medications reviewed.   Current Outpatient Medications:    acetaminophen (TYLENOL) 500 MG tablet, Take 500 mg by mouth every 6 (six) hours as needed for fever. (Patient not taking: Reported on 10/05/2020), Disp: , Rfl:    atorvastatin (LIPITOR) 10 MG tablet, Take 10 mg by mouth daily. (Patient not taking: Reported on 10/05/2020), Disp: , Rfl:    bismuth subsalicylate (PEPTO BISMOL) 262 MG/15ML suspension, Take 30 mLs by mouth every 6 (six) hours as needed for indigestion. (Patient not taking: Reported on 10/05/2020), Disp: , Rfl:    guaiFENesin-dextromethorphan (ROBITUSSIN DM) 100-10 MG/5ML syrup, Take 5 mLs by mouth every 6 (six) hours as needed for cough. (Patient not taking: Reported on 10/05/2020), Disp: 118 mL, Rfl: 0   losartan-hydrochlorothiazide (HYZAAR) 100-12.5 MG tablet, Take 1 tablet by mouth daily., Disp: , Rfl:    Ht Readings from Last 1 Encounters:  10/05/20 5' 10.25" (1.784 m)     Wt Readings from Last 3 Encounters:  10/16/20 266 lb 15.6 oz (121.1 kg)  10/05/20 266 lb 5.1 oz (120.8 kg)  02/13/20 240 lb 15.4 oz (109.3 kg)     Body mass index is 38.04 kg/m.   Social History   Tobacco Use  Smoking Status Never  Smokeless Tobacco Never      Nutrition Note  Spoke with pt. Nutrition Plan and Nutrition Survey goals reviewed with pt. Per PYP survey, he is not following a healthy diet. He agrees with this assessment.  Pt wearing O2 with exertion. Pt states goal of weight loss. Pt checks BP daily. He states 140/80 is normal for him. He knows sodium intake may increase BP. Pt taking lipitor -- lipid panel from 07/2019 shows elevated LDL. Avoiding grapefruits. Reviewed diet history. Pt has gone on diets in past to lose weight. He has weight cycled. He states his "diet is horrible".   Per discussion, pt does use canned/convenience foods often. Pt  does add salt to food. Pt does eat out frequently.  Diet recall: Breakfast: skips or fruit or waffle Lunch: cured ham sandwich on white bread with lettuce, tomato, mayo, cheese Dinner: varies - chicken/fish/steak with starch, sometimes tacos, other cooked meals or sometimes eats out or gets takeout.  Fluids: soda (12-24 oz daily), sweet tea/lemonade (24 oz daily), sometimes juice  Pt expressed understanding of the information reviewed.    Nutrition Diagnosis  Obese   II = 35-39.9 related to excessive energy intake and food knowledge deficit as evidenced by pt stated weight loss goals and Body mass index is 38.04 kg/m.  Nutrition Intervention Pt's individual nutrition plan reviewed with pt. Self monitoring using online food tracker(myfitnesspal or Loseit), [id number] kcals/day, nutrition education including label reading and meal planning and generally healthful diet  Continue client-centered nutrition education by RD, as part of interdisciplinary care.  Goal(s) Pt to identify food quantities necessary to achieve weight loss of 6-24 lb at graduation from cardiac rehab.  Pt to build a healthy plate including vegetables, fruits, whole grains, and low-fat dairy products in a heart healthy meal plan. Pt to learn how to incorporate more nutrient dense foods into diet by tracking foods and reading food labels to understand foods he is eating  Plan:  Will provide client-centered nutrition education as part of interdisciplinary care Monitor and evaluate progress toward nutrition goal with team.   Andrey Campanile, MS, RDN, LDN

## 2020-10-18 ENCOUNTER — Encounter (HOSPITAL_COMMUNITY)
Admission: RE | Admit: 2020-10-18 | Discharge: 2020-10-18 | Disposition: A | Discharge status: Home / Self Care | Payer: BC Managed Care – PPO | Source: Ambulatory Visit | Attending: Cardiology | Admitting: Cardiology

## 2020-10-18 ENCOUNTER — Other Ambulatory Visit: Payer: Self-pay

## 2020-10-18 DIAGNOSIS — J470 Bronchiectasis with acute lower respiratory infection: Secondary | ICD-10-CM | POA: Diagnosis not present

## 2020-10-18 NOTE — Progress Notes (Signed)
Daily Session Note  Patient Details  Name: Darren Wood MRN: [id number] Date of Birth: 1960/09/02 Referring Provider:   April Manson Pulmonary Rehab Walk Test from 10/05/2020 in [place]  Referring Provider Dr. Camillo Flaming       Encounter Date: 10/18/2020  Check In:  Session Check In - 10/18/20 1500       Check-In   Supervising physician immediately available to respond to emergencies [facility] immediately available    Physician(s) Dr. Arbutus Ped    Location [facility]    Staff Present Rosebud Poles, RN, BSN;Other;Lisa Ysidro Evert, RN    Virtual Visit No    Medication changes reported     No    Fall or balance concerns reported    No    Tobacco Cessation No Change    Warm-up and Cool-down Performed as group-led instruction    Resistance Training Performed Yes    VAD Patient? No      Pain Assessment   Currently in Pain? No/denies    Pain Score 0-No pain    Multiple Pain Sites No             Capillary Blood Glucose: No results found for this or any previous visit (from the past 24 hour(s)).    Social History   Tobacco Use  Smoking Status Never  Smokeless Tobacco Never    Goals Met:  Proper associated with RPD/PD & O2 Sat Exercise tolerated well Strength training completed today  Goals Unmet:  Not Applicable  Comments: Service time is from 4967 to 1430    Dr. Fransico Him is Medical Director for Cardiac Rehab at [facility].

## 2020-10-23 ENCOUNTER — Other Ambulatory Visit: Payer: Self-pay

## 2020-10-23 ENCOUNTER — Encounter (HOSPITAL_COMMUNITY)
Admission: RE | Admit: 2020-10-23 | Discharge: 2020-10-23 | Disposition: A | Discharge status: Home / Self Care | Payer: BC Managed Care – PPO | Source: Ambulatory Visit | Attending: Cardiology | Admitting: Cardiology

## 2020-10-23 DIAGNOSIS — J470 Bronchiectasis with acute lower respiratory infection: Secondary | ICD-10-CM

## 2020-10-23 NOTE — Progress Notes (Signed)
Daily Session Note  Patient Details  Name: Ledarius Leeson MRN: [id number] Date of Birth: [birth year] Referring Provider:   April Manson Pulmonary Rehab Walk Test from 10/05/2020 in [place]  Referring Provider Dr. Camillo Flaming       Encounter Date: 10/23/2020  Check In:  Session Check In - 10/23/20 1430       Check-In   Supervising physician immediately available to respond to emergencies [facility] immediately available    Physician(s) Dr. Arbutus Ped    Location [facility]    Staff Present Rosebud Poles, RN, BSN;Lisa Ysidro Evert, RN;Jessica Hassell Done, MS, ACSM-CEP, Exercise Physiologist    Virtual Visit No    Medication changes reported     No    Fall or balance concerns reported    No    Tobacco Cessation No Change    Warm-up and Cool-down Performed as group-led instruction    Resistance Training Performed Yes    VAD Patient? No    PAD/SET Patient? No      Pain Assessment   Currently in Pain? No/denies    Multiple Pain Sites No             Capillary Blood Glucose: No results found for this or any previous visit (from the past 24 hour(s)).    Social History   Tobacco Use  Smoking Status Never  Smokeless Tobacco Never    Goals Met:  Proper associated with RPD/PD & O2 Sat Exercise tolerated well Strength training completed today  Goals Unmet:  Not Applicable  Comments: Service time is from 6213 to 1440    Dr. Fransico Him is Medical Director for Cardiac Rehab at [facility].

## 2020-10-23 NOTE — Progress Notes (Signed)
Pulmonary Individual Treatment Plan  Patient Details  Name: Darren Wood MRN: [id number] Date of Birth: 03/14/1961 Referring Provider:   April Manson Pulmonary Rehab Walk Test from 10/05/2020 in [place]  Referring Provider Dr. Camillo Flaming       Initial Encounter Date:  Flowsheet Row Pulmonary Rehab Walk Test from 10/05/2020 in [place]  Date 10/05/20       Visit Diagnosis: Bronchiectasis with acute lower respiratory infection ([place])  Patient's Home Medications on Admission:   Current Outpatient Medications:    acetaminophen (TYLENOL) 500 MG tablet, Take 500 mg by mouth every 6 (six) hours as needed for fever. (Patient not taking: Reported on 10/05/2020), Disp: , Rfl:    atorvastatin (LIPITOR) 10 MG tablet, Take 10 mg by mouth daily. (Patient not taking: Reported on 10/05/2020), Disp: , Rfl:    bismuth subsalicylate (PEPTO BISMOL) 262 MG/15ML suspension, Take 30 mLs by mouth every 6 (six) hours as needed for indigestion. (Patient not taking: Reported on 10/05/2020), Disp: , Rfl:    guaiFENesin-dextromethorphan (ROBITUSSIN DM) 100-10 MG/5ML syrup, Take 5 mLs by mouth every 6 (six) hours as needed for cough. (Patient not taking: Reported on 10/05/2020), Disp: 118 mL, Rfl: 0   losartan-hydrochlorothiazide (HYZAAR) 100-12.5 MG tablet, Take 1 tablet by mouth daily., Disp: , Rfl:   Past Medical History: Past Medical History:  Diagnosis Date   Hypertension     Tobacco Use: Social History   Tobacco Use  Smoking Status Never  Smokeless Tobacco Never    Labs: Recent Review Flowsheet Data     Labs for ITP Cardiac and Pulmonary Rehab Latest Ref Rng & Units 02/13/2020   Trlycerides <150 mg/dL 151(H)       Capillary Blood Glucose: No results found for: GLUCAP   Pulmonary Assessment Scores:  Pulmonary Assessment Scores     Row Name 10/05/20 1530         ADL UCSD   ADL Phase Entry     SOB Score total 46           CAT  Score     CAT Score 17           mMRC Score     mMRC Score 4            UCSD: Self-administered rating of dyspnea associated with activities of daily living (ADLs) 6-point scale (0 = "not at all" to 5 = "maximal or unable to do because of breathlessness")  Scoring Scores range from 0 to 120.  Minimally important difference is 5 units  CAT: CAT can identify the health impairment of COPD patients and is better correlated with disease progression.  CAT has a scoring range of zero to 40. The CAT score is classified into four groups of low (less than 10), medium (10 - 20), high (21-30) and very high (31-40) based on the impact level of disease on health status. A CAT score over 10 suggests significant symptoms.  A worsening CAT score could be explained by an exacerbation, poor medication adherence, poor inhaler technique, or progression of COPD or comorbid conditions.  CAT MCID is 2 points  mMRC: mMRC (Modified Medical Research Council) Dyspnea Scale is used to assess the degree of baseline functional disability in patients of respiratory disease due to dyspnea. No minimal important difference is established. A decrease in score of 1 point or greater is considered a positive change.   Pulmonary Function Assessment:  Pulmonary Function Assessment - 10/05/20  1408       Breath   Shortness of Breath Yes;Limiting activity             Exercise Target Goals: Exercise Program Goal: Individual exercise prescription set using results from initial 6 min walk test and THRR while considering  patient's activity barriers and safety.   Exercise Prescription Goal: Initial exercise prescription builds to 30-45 minutes a day of aerobic activity, 2-3 days per week.  Home exercise guidelines will be given to patient during program as part of exercise prescription that the participant will acknowledge.  Activity Barriers & Risk Stratification:  Activity Barriers & Cardiac Risk Stratification -  10/05/20 1402       Activity Barriers & Cardiac Risk Stratification   Activity Barriers Left Hip Replacement;Shortness of Breath;Arthritis             6 Minute Walk:  6 Minute Walk     Row Name 10/05/20 1518         6 Minute Walk   Phase Initial     Distance 800 feet     Walk Time 6 minutes     # of Rest Breaks 0     MPH 1.52     METS 2.29     RPE 12     Perceived Dyspnea  1     VO2 Peak 8.03     Symptoms Yes (comment)     Comments Hip and knee pain 5/10-chronic     Resting HR 91 bpm     Resting BP 124/86     Resting Oxygen Saturation  98 %     Exercise Oxygen Saturation  during 6 min walk 92 %     Max Ex. HR 132 bpm     Max Ex. BP 144/80     2 Minute Post BP 132/82           Interval HR     1 Minute HR 123     2 Minute HR 124     3 Minute HR 125     4 Minute HR 130     5 Minute HR 130     6 Minute HR 132     2 Minute Post HR 109     Interval Heart Rate? Yes           Interval Oxygen     Interval Oxygen? Yes     Baseline Oxygen Saturation % 98 %     1 Minute Oxygen Saturation % 93 %     1 Minute Liters of Oxygen 2 L     2 Minute Oxygen Saturation % 96 %     2 Minute Liters of Oxygen 2 L     3 Minute Oxygen Saturation % 96 %     3 Minute Liters of Oxygen 2 L     4 Minute Oxygen Saturation % 94 %     4 Minute Liters of Oxygen 2 L     5 Minute Oxygen Saturation % 95 %     5 Minute Liters of Oxygen 2 L     6 Minute Oxygen Saturation % 92 %     6 Minute Liters of Oxygen 2 L     2 Minute Post Oxygen Saturation % 99 %     2 Minute Post Liters of Oxygen 2 L             Oxygen Initial Assessment:  Oxygen Initial Assessment - 10/05/20 1406  Home Oxygen   Home Oxygen Device Portable Concentrator;Home Concentrator    Sleep Oxygen Prescription None    Home Exercise Oxygen Prescription Pulsed    Liters per minute 3    Home Resting Oxygen Prescription None    Compliance with Home Oxygen Use Yes      Initial 6 min Walk   Oxygen Used  Continuous    Liters per minute 2      Program Oxygen Prescription   Program Oxygen Prescription Continuous    Liters per minute 2      Intervention   Short Term Goals To learn and exhibit compliance with exercise, home and travel O2 prescription;To learn and understand importance of monitoring SPO2 with pulse oximeter and demonstrate accurate use of the pulse oximeter.;To learn and understand importance of maintaining oxygen saturations>88%;To learn and demonstrate proper pursed lip breathing techniques or other breathing techniques. ;To learn and demonstrate proper use of respiratory medications    Long  Term Goals Exhibits compliance with exercise, home  and travel O2 prescription;Verbalizes importance of monitoring SPO2 with pulse oximeter and return demonstration;Maintenance of O2 saturations>88%;Exhibits proper breathing techniques, such as pursed lip breathing or other method taught during program session;Compliance with respiratory medication;Demonstrates proper use of MDI's             Oxygen Re-Evaluation:  Oxygen Re-Evaluation     Row Name 10/22/20 1331             Program Oxygen Prescription   Program Oxygen Prescription Continuous       Liters per minute 2               Home Oxygen     Home Oxygen Device Portable Concentrator;Home Concentrator       Sleep Oxygen Prescription None       Home Exercise Oxygen Prescription Pulsed       Liters per minute 3       Home Resting Oxygen Prescription None       Compliance with Home Oxygen Use Yes               Goals/Expected Outcomes     Short Term Goals To learn and exhibit compliance with exercise, home and travel O2 prescription;To learn and understand importance of monitoring SPO2 with pulse oximeter and demonstrate accurate use of the pulse oximeter.;To learn and understand importance of maintaining oxygen saturations>88%;To learn and demonstrate proper pursed lip breathing techniques or other breathing techniques. ;To  learn and demonstrate proper use of respiratory medications       Long  Term Goals Exhibits compliance with exercise, home  and travel O2 prescription;Verbalizes importance of monitoring SPO2 with pulse oximeter and return demonstration;Maintenance of O2 saturations>88%;Exhibits proper breathing techniques, such as pursed lip breathing or other method taught during program session;Compliance with respiratory medication;Demonstrates proper use of MDI's       Goals/Expected Outcomes compliance and understanding of monitoring oxygen saturation and performing pursed lip breathing when needed.               Oxygen Discharge (Final Oxygen Re-Evaluation):  Oxygen Re-Evaluation - 10/22/20 1331       Program Oxygen Prescription   Program Oxygen Prescription Continuous    Liters per minute 2      Home Oxygen   Home Oxygen Device Portable Concentrator;Home Concentrator    Sleep Oxygen Prescription None    Home Exercise Oxygen Prescription Pulsed    Liters per minute 3    Home Resting Oxygen  Prescription None    Compliance with Home Oxygen Use Yes      Goals/Expected Outcomes   Short Term Goals To learn and exhibit compliance with exercise, home and travel O2 prescription;To learn and understand importance of monitoring SPO2 with pulse oximeter and demonstrate accurate use of the pulse oximeter.;To learn and understand importance of maintaining oxygen saturations>88%;To learn and demonstrate proper pursed lip breathing techniques or other breathing techniques. ;To learn and demonstrate proper use of respiratory medications    Long  Term Goals Exhibits compliance with exercise, home  and travel O2 prescription;Verbalizes importance of monitoring SPO2 with pulse oximeter and return demonstration;Maintenance of O2 saturations>88%;Exhibits proper breathing techniques, such as pursed lip breathing or other method taught during program session;Compliance with respiratory medication;Demonstrates proper use  of MDI's    Goals/Expected Outcomes compliance and understanding of monitoring oxygen saturation and performing pursed lip breathing when needed.             Initial Exercise Prescription:  Initial Exercise Prescription - 10/05/20 1500       Date of Initial Exercise RX and Referring Provider   Date 10/05/20    Referring Provider Dr. Camillo Flaming    Expected Discharge Date 12/06/20      Oxygen   Oxygen Continuous    Liters 2      NuStep   Level 1    SPM 80    Minutes 15      Arm Ergometer   Level 1    Minutes 15      Intensity   THRR 40-80% of Max Heartrate 63-126    Ratings of Perceived Exertion 11-13    Perceived Dyspnea 0-4      Progression   Progression Continue to progress workloads to maintain intensity without signs/symptoms of physical distress.      Resistance Training   Training Prescription Yes    Weight Blue bands    Reps 10-15             Perform Capillary Blood Glucose checks as needed.  Exercise Prescription Changes:   Exercise Prescription Changes     Row Name 10/16/20 1400             Response to Exercise   Blood Pressure (Admit) 140/80       Blood Pressure (Exercise) 154/82       Blood Pressure (Exit) 126/84       Heart Rate (Admit) 94 bpm       Heart Rate (Exercise) 111 bpm       Heart Rate (Exit) 105 bpm       Oxygen Saturation (Admit) 99 %       Oxygen Saturation (Exercise) 111 %       Oxygen Saturation (Exit) 105 %       Rating of Perceived Exertion (Exercise) 11       Perceived Dyspnea (Exercise) 0       Duration Continue with 30 min of aerobic exercise without signs/symptoms of physical distress.       Intensity THRR unchanged               Progression     Progression Continue to progress workloads to maintain intensity without signs/symptoms of physical distress.       Average METs 1.8               Resistance Training     Training Prescription Yes       Weight Blue bands  Reps 10-15       Time 10 Minutes                Oxygen     Oxygen Continuous       Liters 2               NuStep     Level 3       SPM 80       Minutes 30       METs 1.8               Exercise Comments:   Exercise Comments     Row Name 10/09/20 1452           Exercise Comments Patient completed first day of exercise and tolerated well with no complaints or concerns. He does have chronic hip and knee pain but stated that it was not agravated any worse than normal during exercise. He was able to do 15 minutes on the Nustep and 15 minutes on the arm ergometer with no rest breaks. Will continue to monitor.                Exercise Goals and Review:   Exercise Goals     Row Name 10/05/20 1515             Exercise Goals   Increase Physical Activity Yes       Intervention Provide advice, education, support and counseling about physical activity/exercise needs.;Develop an individualized exercise prescription for aerobic and resistive training based on initial evaluation findings, risk stratification, comorbidities and participant's personal goals.       Expected Outcomes Short Term: Attend rehab on a regular basis to increase amount of physical activity.;Long Term: Add in home exercise to make exercise part of routine and to increase amount of physical activity.;Long Term: Exercising regularly at least 3-5 days a week.       Increase Strength and Stamina Yes       Intervention Provide advice, education, support and counseling about physical activity/exercise needs.;Develop an individualized exercise prescription for aerobic and resistive training based on initial evaluation findings, risk stratification, comorbidities and participant's personal goals.       Expected Outcomes Short Term: Increase workloads from initial exercise prescription for resistance, speed, and METs.;Short Term: Perform resistance training exercises routinely during rehab and add in resistance training at home;Long Term: Improve  cardiorespiratory fitness, muscular endurance and strength as measured by increased METs and functional capacity (6MWT)       Able to understand and use rate of perceived exertion (RPE) scale Yes       Intervention Provide education and explanation on how to use RPE scale       Expected Outcomes Short Term: Able to use RPE daily in rehab to express subjective intensity level;Long Term:  Able to use RPE to guide intensity level when exercising independently       Able to understand and use Dyspnea scale Yes       Intervention Provide education and explanation on how to use Dyspnea scale       Expected Outcomes Short Term: Able to use Dyspnea scale daily in rehab to express subjective sense of shortness of breath during exertion;Long Term: Able to use Dyspnea scale to guide intensity level when exercising independently       Knowledge and understanding of Target Heart Rate Range (THRR) Yes       Intervention Provide education and explanation of THRR including how  the numbers were predicted and where they are located for reference       Expected Outcomes Short Term: Able to state/look up THRR;Long Term: Able to use THRR to govern intensity when exercising independently;Short Term: Able to use daily as guideline for intensity in rehab       Understanding of Exercise Prescription Yes       Intervention Provide education, explanation, and written materials on patient's individual exercise prescription       Expected Outcomes Short Term: Able to explain program exercise prescription;Long Term: Able to explain home exercise prescription to exercise independently                Exercise Goals Re-Evaluation :  Exercise Goals Re-Evaluation     Row Name 10/22/20 1326             Exercise Goal Re-Evaluation   Exercise Goals Review Increase Physical Activity;Increase Strength and Stamina;Able to understand and use rate of perceived exertion (RPE) scale;Able to understand and use Dyspnea  scale;Knowledge and understanding of Target Heart Rate Range (THRR);Understanding of Exercise Prescription       Comments Keith has completed 4 exercise sessions and has tolerated well so far. He does have chronic hip pain that causes him to walk with a limp and he uses a cane for assistance. He has not complained of exercise making the pain any worse so far. He is able to do 15 minutes on the Nustep and 15 minutes on the arm ergometer without taking any rest breaks. In the short amount of time that he has been in the program he has already progressed with workload and MET level increases. He is exercising at 2.0 METS on the Nustep and 42 RPM's on the arm ergometer. Will continue to monitor and progress as he is able.       Expected Outcomes Through exercise at rehab and home, the patient will decrease shortness of breath with daily activities and feel confident in carrying out an exercise regimn at home.                Discharge Exercise Prescription (Final Exercise Prescription Changes):  Exercise Prescription Changes - 10/16/20 1400       Response to Exercise   Blood Pressure (Admit) 140/80    Blood Pressure (Exercise) 154/82    Blood Pressure (Exit) 126/84    Heart Rate (Admit) 94 bpm    Heart Rate (Exercise) 111 bpm    Heart Rate (Exit) 105 bpm    Oxygen Saturation (Admit) 99 %    Oxygen Saturation (Exercise) 111 %    Oxygen Saturation (Exit) 105 %    Rating of Perceived Exertion (Exercise) 11    Perceived Dyspnea (Exercise) 0    Duration Continue with 30 min of aerobic exercise without signs/symptoms of physical distress.    Intensity THRR unchanged      Progression   Progression Continue to progress workloads to maintain intensity without signs/symptoms of physical distress.    Average METs 1.8      Resistance Training   Training Prescription Yes    Weight Blue bands    Reps 10-15    Time 10 Minutes      Oxygen   Oxygen Continuous    Liters 2      NuStep   Level 3     SPM 80    Minutes 30    METs 1.8  Nutrition:  Target Goals: Understanding of nutrition guidelines, daily intake of sodium '1500mg'$ , cholesterol '200mg'$ , calories 30% from fat and 7% or less from saturated fats, daily to have 5 or more servings of fruits and vegetables.  Biometrics:  Pre Biometrics - 10/05/20 1515       Pre Biometrics   Grip Strength 37 kg              Nutrition Therapy Plan and Nutrition Goals:  Nutrition Therapy & Goals - 10/17/20 0808       Nutrition Therapy   Diet General Healthful    Drug/Food Interactions Statins/Certain Fruits      Personal Nutrition Goals   Nutrition Goal Pt to identify food quantities necessary to achieve weight loss of 6-24 lb at graduation from cardiac rehab.    Personal Goal #2 Pt to build a healthy plate including vegetables, fruits, whole grains, and low-fat dairy products in a heart healthy meal plan.    Personal Goal #3 Pt to learn how to incorporate more nutrient dense foods into diet by tracking foods and reading food labels to understand foods he is eating      Intervention Plan   Intervention Prescribe, educate and counsel regarding individualized specific dietary modifications aiming towards targeted core components such as weight, hypertension, lipid management, diabetes, heart failure and other comorbidities.;Nutrition handout(s) given to patient.    Expected Outcomes Long Term Goal: Adherence to prescribed nutrition plan.;Short Term Goal: A plan has been developed with personal nutrition goals set during dietitian appointment.             Nutrition Assessments:  MEDIFICTS Score Key: ?70 Need to make dietary changes  40-70 Heart Healthy Diet ? 40 Therapeutic Level Cholesterol Diet  Flowsheet Row PULMONARY REHAB OTHER RESPIRATORY from 10/16/2020 in [place]  Picture Your Plate Total Score on Admission 41      Picture Your Plate Scores: <86 Unhealthy  dietary pattern with much room for improvement. 41-50 Dietary pattern unlikely to meet recommendations for good health and room for improvement. 51-60 More healthful dietary pattern, with some room for improvement.  >60 Healthy dietary pattern, although there may be some specific behaviors that could be improved.    Nutrition Goals Re-Evaluation:  Nutrition Goals Re-Evaluation     [place] Name 10/17/20 0808 10/23/20 0919           Goals   Current Weight 266 lb (120.7 kg) 266 lb 15.6 oz (121.1 kg)      Nutrition Goal -- Pt to identify food quantities necessary to achieve weight loss of 6-24 lb at graduation from cardiac rehab.             Personal Goal #2 Re-Evaluation      Personal Goal #2 -- Pt to build a healthy plate including vegetables, fruits, whole grains, and low-fat dairy products in a heart healthy meal plan.             Personal Goal #3 Re-Evaluation      Personal Goal #3 -- Pt to learn how to incorporate more nutrient dense foods into diet by tracking foods and reading food labels to understand foods he is eating              Nutrition Goals Discharge (Final Nutrition Goals Re-Evaluation):  Nutrition Goals Re-Evaluation - 10/23/20 0919       Goals   Current Weight 266 lb 15.6 oz (121.1 kg)    Nutrition Goal Pt to identify  food quantities necessary to achieve weight loss of 6-24 lb at graduation from cardiac rehab.      Personal Goal #2 Re-Evaluation   Personal Goal #2 Pt to build a healthy plate including vegetables, fruits, whole grains, and low-fat dairy products in a heart healthy meal plan.      Personal Goal #3 Re-Evaluation   Personal Goal #3 Pt to learn how to incorporate more nutrient dense foods into diet by tracking foods and reading food labels to understand foods he is eating             Psychosocial: Target Goals: Acknowledge presence or absence of significant depression and/or stress, maximize coping skills, provide positive support system.  Participant is able to verbalize types and ability to use techniques and skills needed for reducing stress and depression.  Initial Review & Psychosocial Screening:  Initial Psych Review & Screening - 10/05/20 1409       Initial Review   Current issues with None Identified      Family Dynamics   Good Support System? Yes   Wife, daughter, son, sister, and church family     Barriers   Psychosocial barriers to participate in program The patient should benefit from training in stress management and relaxation.      Screening Interventions   Interventions Encouraged to exercise    Expected Outcomes Long Term Goal: Stressors or current issues are controlled or eliminated.;Long Term goal: The participant improves quality of Life and PHQ9 Scores as seen by post scores and/or verbalization of changes             Quality of Life Scores:  Scores of 19 and below usually indicate a poorer quality of life in these areas.  A difference of  2-3 points is a clinically meaningful difference.  A difference of 2-3 points in the total score of the Quality of Life Index has been associated with significant improvement in overall quality of life, self-image, physical symptoms, and general health in studies assessing change in quality of life.  PHQ-9: Recent Review Flowsheet Data     Depression screen [facility] 2/9 10/05/2020   Decreased Interest 0   Down, Depressed, Hopeless 1   PHQ - 2 Score 1   Altered sleeping 0   Tired, decreased energy 2   Change in appetite 0   Feeling bad or failure about yourself  0   Trouble concentrating 0   Moving slowly or fidgety/restless 0   Suicidal thoughts 0   Difficult doing work/chores Not difficult at all      Interpretation of Total Score  Total Score Depression Severity:  1-4 = Minimal depression, 5-9 = Mild depression, 10-14 = Moderate depression, 15-19 = Moderately severe depression, 20-27 = Severe depression   Psychosocial Evaluation and Intervention:   Psychosocial Evaluation - 10/05/20 1521       Psychosocial Evaluation & Interventions   Interventions Encouraged to exercise with the program and follow exercise prescription    Comments Patient does not show any psychosocial barriers and seems to have a positive outlook on life. He has a great support system that consists of family and church members.    Expected Outcomes Pt to continue positive outlook and continued mental well being through healthy coping mechanisms    Continue Psychosocial Services  No Follow up required             Psychosocial Re-Evaluation:  Psychosocial Re-Evaluation     [place] Name 10/22/20 1018  Psychosocial Re-Evaluation   Current issues with None Identified       Comments No psychosocial concerns identified at this time.       Expected Outcomes For [place] to continue to be free of psychosocial concerns while particpating in pulmonary rehab.       Interventions Encouraged to attend Pulmonary Rehabilitation for the exercise       Continue Psychosocial Services  No Follow up required                Psychosocial Discharge (Final Psychosocial Re-Evaluation):  Psychosocial Re-Evaluation - 10/22/20 1018       Psychosocial Re-Evaluation   Current issues with None Identified    Comments No psychosocial concerns identified at this time.    Expected Outcomes For Derrik to continue to be free of psychosocial concerns while particpating in pulmonary rehab.    Interventions Encouraged to attend Pulmonary Rehabilitation for the exercise    Continue Psychosocial Services  No Follow up required             Education: Education Goals: Education classes will be provided on a weekly basis, covering required topics. Participant will state understanding/return demonstration of topics presented.  Learning Barriers/Preferences:  Learning Barriers/Preferences - 10/05/20 1410       Learning Barriers/Preferences   Learning Barriers None    Wears reading glasses   Learning Preferences Written Material;Skilled Demonstration;Individual Instruction             Education Topics: Risk Factor Reduction:  -Group instruction that is supported by a PowerPoint presentation. Instructor discusses the definition of a risk factor, different risk factors for pulmonary disease, and how the heart and lungs work together.     Nutrition for Pulmonary Patient:  -Group instruction provided by PowerPoint slides, verbal discussion, and written materials to support subject matter. The instructor gives an explanation and review of healthy diet recommendations, which includes a discussion on weight management, recommendations for fruit and vegetable consumption, as well as protein, fluid, caffeine, fiber, sodium, sugar, and alcohol. Tips for eating when patients are short of breath are discussed. Flowsheet Row PULMONARY REHAB OTHER RESPIRATORY from 10/18/2020 in [place]  Date 10/18/20  Educator handout       Pursed Lip Breathing:  -Group instruction that is supported by demonstration and informational handouts. Instructor discusses the benefits of pursed lip and diaphragmatic breathing and detailed demonstration on how to preform both.     Oxygen Safety:  -Group instruction provided by PowerPoint, verbal discussion, and written material to support subject matter. There is an overview of "What is Oxygen" and "Why do we need it".  Instructor also reviews how to create a safe environment for oxygen use, the importance of using oxygen as prescribed, and the risks of noncompliance. There is a brief discussion on traveling with oxygen and resources the patient may utilize. Flowsheet Row PULMONARY REHAB OTHER RESPIRATORY from 10/18/2020 in [place]  Date 10/11/20  Educator Handout       Oxygen Equipment:  -Group instruction provided by [facility] utilizing handouts, written  materials, and equipment demonstrations.   Signs and Symptoms:  -Group instruction provided by written material and verbal discussion to support subject matter. Warning signs and symptoms of infection, stroke, and heart attack are reviewed and when to call the physician/911 reinforced. Tips for preventing the spread of infection discussed.   Advanced Directives:  -Group instruction provided by verbal instruction and written material to  support subject matter. Instructor reviews Advanced Directive laws and proper instruction for filling out document.   Pulmonary Video:  -Group video education that reviews the importance of medication and oxygen compliance, exercise, good nutrition, pulmonary hygiene, and pursed lip and diaphragmatic breathing for the pulmonary patient.   Exercise for the Pulmonary Patient:  -Group instruction that is supported by a PowerPoint presentation. Instructor discusses benefits of exercise, core components of exercise, frequency, duration, and intensity of an exercise routine, importance of utilizing pulse oximetry during exercise, safety while exercising, and options of places to exercise outside of rehab.     Pulmonary Medications:  -Verbally interactive group education provided by instructor with focus on inhaled medications and proper administration.   Anatomy and Physiology of the Respiratory System and Intimacy:  -Group instruction provided by PowerPoint, verbal discussion, and written material to support subject matter. Instructor reviews respiratory cycle and anatomical components of the respiratory system and their functions. Instructor also reviews differences in obstructive and restrictive respiratory diseases with examples of each. Intimacy, Sex, and Sexuality differences are reviewed with a discussion on how relationships can change when diagnosed with pulmonary disease. Common sexual concerns are reviewed.   MD DAY -A group question and answer session  with a medical doctor that allows participants to ask questions that relate to their pulmonary disease state.   OTHER EDUCATION -Group or individual verbal, written, or video instructions that support the educational goals of the pulmonary rehab program.   Holiday Eating Survival Tips:  -Group instruction provided by PowerPoint slides, verbal discussion, and written materials to support subject matter. The instructor gives patients tips, tricks, and techniques to help them not only survive but enjoy the holidays despite the onslaught of food that accompanies the holidays.   Knowledge Questionnaire Score:  Knowledge Questionnaire Score - 10/05/20 1528       Knowledge Questionnaire Score   Pre Score 5/8   Did not answer questions on back of sheet            Core Components/Risk Factors/Patient Goals at Admission:  Personal Goals and Risk Factors at Admission - 10/05/20 1524       Core Components/Risk Factors/Patient Goals on Admission   Improve shortness of breath with ADL's Yes    Intervention Provide education, individualized exercise plan and daily activity instruction to help decrease symptoms of SOB with activities of daily living.    Expected Outcomes Short Term: Improve cardiorespiratory fitness to achieve a reduction of symptoms when performing ADLs;Long Term: Be able to perform more ADLs without symptoms or delay the onset of symptoms    Hypertension Yes    Intervention Provide education on lifestyle modifcations including regular physical activity/exercise, weight management, moderate sodium restriction and increased consumption of fresh fruit, vegetables, and low fat dairy, alcohol moderation, and smoking cessation.;Monitor prescription use compliance.    Expected Outcomes Short Term: Continued assessment and intervention until BP is < 140/41mm HG in hypertensive participants. < 130/8mm HG in hypertensive participants with diabetes, heart failure or chronic kidney  disease.;Long Term: Maintenance of blood pressure at goal levels.             Core Components/Risk Factors/Patient Goals Review:   Goals and Risk Factor Review     Row Name 10/22/20 1019             Core Components/Risk Factors/Patient Goals Review   Personal Goals Review Develop more efficient breathing techniques such as purse lipped breathing and diaphragmatic breathing and practicing self-pacing with activity.;Increase  knowledge of respiratory medications and ability to use respiratory devices properly.;Improve shortness of breath with ADL's       Review Moise started pulmonary rehab 2 weeks ago, it is too early to see progression toward program goals.  He is doing well and exercising at 2.0 mets on the nustep and 47 watts on the arm ergomenter.       Expected Outcomes See admission goals.                Core Components/Risk Factors/Patient Goals at Discharge (Final Review):   Goals and Risk Factor Review - 10/22/20 1019       Core Components/Risk Factors/Patient Goals Review   Personal Goals Review Develop more efficient breathing techniques such as purse lipped breathing and diaphragmatic breathing and practicing self-pacing with activity.;Increase knowledge of respiratory medications and ability to use respiratory devices properly.;Improve shortness of breath with ADL's    Review Urho started pulmonary rehab 2 weeks ago, it is too early to see progression toward program goals.  He is doing well and exercising at 2.0 mets on the nustep and 47 watts on the arm ergomenter.    Expected Outcomes See admission goals.             ITP Comments:   Comments: ITP REVIEW Pt is making expected progress toward pulmonary rehab goals after completing 4 sessions. Recommend continued exercise, life style modification, education, and utilization of breathing techniques to increase stamina and strength and decrease shortness of breath with exertion.

## 2020-10-23 NOTE — Progress Notes (Signed)
I have reviewed a Home Exercise Prescription with Aram Candela . Olanrewaju is currently exercising at home.  He states that he does resistance training exercises and physical therapy stretches 2x/week but does not do it "every" week.The patient was advised to continue with resistance training and stretches and to also add in at least 15 minutes of walking 2-3 days a week for a total of 30-45 minutes.  Alexius and I discussed how to progress their exercise prescription.  The patient stated that their goals were to lose weight (30lbs) and to also improve shortness of breath.  The patient stated that they understand the exercise prescription.  We reviewed exercise guidelines, target heart rate during exercise, RPE Scale, weather conditions, endpoints for exercise, warmup and cool down.  Patient is encouraged to come to me with any questions. I will continue to follow up with the patient to assist them with progression and safety.    Norris Cross MS, ACSM CEP 3:31 PM 10/23/2020

## 2020-10-25 ENCOUNTER — Encounter (HOSPITAL_COMMUNITY): Payer: BC Managed Care – PPO

## 2020-10-30 ENCOUNTER — Encounter (HOSPITAL_COMMUNITY)
Admission: RE | Admit: 2020-10-30 | Discharge: 2020-10-30 | Disposition: A | Discharge status: Home / Self Care | Payer: BC Managed Care – PPO | Source: Ambulatory Visit | Attending: Cardiology | Admitting: Cardiology

## 2020-10-30 ENCOUNTER — Other Ambulatory Visit: Payer: Self-pay

## 2020-10-30 VITALS — Wt 261.7 lb

## 2020-10-30 DIAGNOSIS — J470 Bronchiectasis with acute lower respiratory infection: Secondary | ICD-10-CM

## 2020-10-30 NOTE — Progress Notes (Addendum)
Daily Session Note  Patient Details  Name: Darren Wood MRN: [id number] Date of Birth: 03-08-1961 Referring Provider:   April Wood Pulmonary Rehab Walk Test from 10/05/2020 in [place]  Referring Provider Dr. Camillo Flaming       Encounter Date: 10/30/2020  Check In:  Session Check In - 10/30/20 1421       Check-In   Supervising physician immediately available to respond to emergencies [facility] immediately available    Physician(s) Dr. Lonny Prude    Location [facility]    Staff Present Rodney Langton, RN;Olinty Celesta Aver, MS, ACSM CEP, Exercise Physiologist;Other;Jessica Hassell Done, MS, ACSM-CEP, Exercise Physiologist    Virtual Visit No    Medication changes reported     No    Fall or balance concerns reported    No    Tobacco Cessation No Change    Warm-up and Cool-down Performed as group-led instruction    Resistance Training Performed Yes    VAD Patient? No    PAD/SET Patient? No      Pain Assessment   Currently in Pain? No/denies    Multiple Pain Sites No             Capillary Blood Glucose: No results found for this or any previous visit (from the past 24 hour(s)).   Exercise Prescription Changes - 10/30/20 1500       Response to Exercise   Blood Pressure (Admit) 124/84    Blood Pressure (Exercise) 138/72    Blood Pressure (Exit) 90/60   BP after exercise 90/50 gave pt water to drink, slowly improvered 106/71.   Heart Rate (Admit) 113 bpm    Heart Rate (Exercise) 120 bpm    Heart Rate (Exit) 112 bpm    Oxygen Saturation (Admit) 98 %    Oxygen Saturation (Exercise) 95 %    Oxygen Saturation (Exit) 98 %    Rating of Perceived Exertion (Exercise) 11    Perceived Dyspnea (Exercise) 1    Duration Continue with 30 min of aerobic exercise without signs/symptoms of physical distress.    Intensity THRR unchanged      Progression   Progression Continue to progress workloads to maintain intensity without signs/symptoms of  physical distress.      Resistance Training   Training Prescription Yes    Weight blue bands    Reps 10-15    Time 10 Minutes      Oxygen   Oxygen Continuous      NuStep   Level 4    SPM 80    Minutes 150    METs 2      Arm Ergometer   Level 4    Watts 43    Minutes 15             Social History   Tobacco Use  Smoking Status Never  Smokeless Tobacco Never    Goals Met:  Exercise tolerated well No report of cardiac concerns or symptoms Strength training completed today  Goals Unmet:  BP After exercise today pt's BP dropped to 90/50, pt's skin moist and clammy. He did not start drinking water until I encouraged him to.Had him to drink 2 bottles of water and BP improved to 106/71. He denies any symptoms, even with standing BP 111/75. Pt discharged in stable condition  Comments: Service time is from 1315 to 1450    Dr. Fransico Him is Medical Director for Cardiac Rehab at [facility].

## 2020-11-01 ENCOUNTER — Encounter (HOSPITAL_COMMUNITY)
Admission: RE | Admit: 2020-11-01 | Discharge: 2020-11-01 | Disposition: A | Discharge status: Home / Self Care | Payer: BC Managed Care – PPO | Source: Ambulatory Visit | Attending: Cardiology | Admitting: Cardiology

## 2020-11-01 ENCOUNTER — Other Ambulatory Visit: Payer: Self-pay

## 2020-11-01 DIAGNOSIS — J470 Bronchiectasis with acute lower respiratory infection: Secondary | ICD-10-CM

## 2020-11-01 NOTE — Progress Notes (Signed)
Daily Session Note  Patient Details  Name: Darren Wood MRN: [id number] Date of Birth: Aug 27, 1960 Referring Provider:   April Manson Pulmonary Rehab Walk Test from 10/05/2020 in [place]  Referring Provider Dr. Camillo Flaming       Encounter Date: 11/01/2020  Check In:  Session Check In - 11/01/20 1448       Check-In   Supervising physician immediately available to respond to emergencies [facility] immediately available    Physician(s) Dr. Alfredia Ferguson    Location [facility]    Staff Present Rosebud Poles, RN, Quentin Ore, MS, ACSM-CEP, Exercise Physiologist;Claretta Kendra Ysidro Evert, RN    Virtual Visit No    Medication changes reported     No    Fall or balance concerns reported    No    Tobacco Cessation No Change    Warm-up and Cool-down Performed as group-led instruction    Resistance Training Performed Yes    VAD Patient? No    PAD/SET Patient? No      Pain Assessment   Currently in Pain? No/denies    Multiple Pain Sites No             Capillary Blood Glucose: No results found for this or any previous visit (from the past 24 hour(s)).    Social History   Tobacco Use  Smoking Status Never  Smokeless Tobacco Never    Goals Met:  Exercise tolerated well No report of cardiac concerns or symptoms Strength training completed today  Goals Unmet:  Not Applicable  Comments: Service time is from 1315 to 13    Dr. Fransico Him is Medical Director for Cardiac Rehab at [facility].

## 2020-11-02 ENCOUNTER — Encounter: Payer: Self-pay | Admitting: Internal Medicine

## 2020-11-02 ENCOUNTER — Ambulatory Visit: Payer: BC Managed Care – PPO | Admitting: Internal Medicine

## 2020-11-02 VITALS — BP 120/80 | HR 94 | Ht 72.0 in | Wt 263.4 lb

## 2020-11-02 DIAGNOSIS — R5381 Other malaise: Secondary | ICD-10-CM

## 2020-11-02 DIAGNOSIS — Z8616 Personal history of COVID-19: Secondary | ICD-10-CM

## 2020-11-02 DIAGNOSIS — U099 Post covid-19 condition, unspecified: Secondary | ICD-10-CM

## 2020-11-02 DIAGNOSIS — Z87898 Personal history of other specified conditions: Secondary | ICD-10-CM | POA: Diagnosis not present

## 2020-11-02 DIAGNOSIS — R9389 Abnormal findings on diagnostic imaging of other specified body structures: Secondary | ICD-10-CM

## 2020-11-02 DIAGNOSIS — R0681 Apnea, not elsewhere classified: Secondary | ICD-10-CM

## 2020-11-02 DIAGNOSIS — E669 Obesity, unspecified: Secondary | ICD-10-CM

## 2020-11-02 DIAGNOSIS — R0609 Other forms of dyspnea: Secondary | ICD-10-CM

## 2020-11-02 NOTE — Patient Instructions (Addendum)
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 -The best way to approach this is to take a holistic approach and understand different components that continue to remain barriers to you getting better -and this includes potential heart issues, potential undiagnosed sleep apnea, and physical deconditioning -In addition current severity of lung disease post-COVID needs to be assessed -Currently noticed that you are on short-term disability through 01/03/2021  Plan - Refer to sleep doctor was in our practice - Get 2D echocardiogram - Get high-resolution CT chest supine and prone - Get full pulmonary function test -For now continue to use oxygen at night and with exertion - Continue pulmonary rehabilitation  Follow-up -Refer sleep doctor - July/August 2022 with nurse practitioner to review results but after completing all of the above -Return to see Dr. Marchelle Gearing in September 2022 for follow-up  -ILD symptom score and walking desaturation test at follow-up

## 2020-11-02 NOTE — Addendum Note (Signed)
Addended by: Wyvonne Lenz on: 11/02/2020 05:30 PM   Modules accepted: Orders

## 2020-11-02 NOTE — Progress Notes (Signed)
OV 11/02/2020  Subjective:  Patient ID: Darren Wood, male , DOB: 07/01/1960 , age 60 y.o. , MRN: [id number] , ADDRESS: [address] [place] [address] PCP Loyal Jacobson, MD Patient Care Team: Loyal Jacobson, MD as PCP - General (Family Medicine)  This Provider for this visit: Treatment Team:  Attending Provider: Kalman Shan, MD    11/02/2020 -   Chief Complaint  Patient presents with   Consult    Pt is being referred by Angus Seller, NP due to SOB from covid.  Pt states he had Covid Oct 2021 and states he is still having problems with SOB that happens with activities.     HPI Darren Wood 60 y.o. -history is gained from talking to the patient and also review of the records.  He has a history of hypertension, obesity class II, dyslipidemia and DJD.  He tested positive for COVID on 02/07/2020.  He was admitted between 02/13/2020 through 02/26/2020 with COVID-19 at the [facility] health system.  He had an abnormal chest x-ray that is documented below.  He was treated with oxygen, remdesivir, steroids and tocilizumab.  He also got a short course of antibiotics for concern for aspiration pneumonia.  He also got nystatin for thrush.  He was discharged on oxygen.   In January 2022 he had CT scan of the chest at [facility] that showed some bronchiectasis.  Elevation of right diaphragm also noted.  His ANA was negative./Borderline positive.  It is unclear to me if a sniff test was done.  In May 2022 he tried to return back to work where he works doing [employer] but he could not because of the physicality of the work.  He is also complaining of brain fog.  Review of the outside records indicat that he saw Dr. Burna Cash [facility] pulmonary fellow as recently as May 2022.  His assessment and plan as listed below.  1. Post-COVID syndrome  2. Restrictive lung disease  3. Elevated hemidiaphragm  4. Chronic respiratory failure with hypoxia (HCC)   #1 post COVID  syndrome Resume use of Flovent 110 inhaler twice daily as directed; I reminded him that he has refills available to him through 04/24/2021 Continue use of albuterol inhaler as needed for chest congestion, chest tightness or shortness of breath Referral to long COVID program at [facility] with Dr. Nolene Bernheim [phone]; [facility] [address] [place], [place] [address]  2. Restrictive lung disease 3. Elevated hemidiaphragm PFT 04/30/2020 ratio 91% FEV1 59% FVC 50% DLCO 51% Right hemidiaphragm noted and is contributing to overall dyspnea on exertion  4. Chronic respiratory failure 6-minute walk test conducted today again shows oxygen desaturation on exertion with need for 2 L/min supplemental oxygen Continue use of supplemental oxygen to maintain pulse ox readings between 88% and 93% The referral for pulmonary rehab was placed 6 to 8 weeks ago. He has not yet gotten a place in the pulmonary rehab program. He will start the pulmonary rehab as soon as he is notified.  Short-term disability form completed to have him out of work 09/21/2020 - 01/03/2021  On 10/04/2020 he visited with Angus Seller, COVID nurse practitioner.  At this time he expressed symptoms of dyspnea with exertion, myalgias, arthralgias, memory loss.  This is despite Flovent and doing pulm rehabilitation.  It was noticed that he was still requiring 2 L with exertion.  He denied any fever chills.  Denied any nausea vomiting diarrhea.  Denied any hemoptysis.  Denied chest pain or edema.  Therefore pulmonary referral was made.  He then started pulmonary rehabilitation on 10/09/2020, he presents to the pulmonary clinic on 11/02/2020 for new consult evaluation.  He lives in [place] he and his wife state that [place] is closer.  He is happy to continue and establish with the follow-up here.  At some point he will need his disability refill.  At this point in time he is complaining of significant dyspnea on exertion along with  cognitive processing issues post-COVID.  In talking to him his Epworth sleepiness score is 10.  His wife says he snores a lot and does have apneic spells at night.  Never been tested or diagnosed with sleep apnea.  No known heart issues.  Overall course is 1 of improvement but he still feels he is 20-30% you need to make in order to feel fully optimal.  At home he uses oxygen with exertion but not at rest or walking room to room.  He was surprised that in a walking desaturation test today he did not desaturate to the point he needs oxygen.  At rehab he is using 2 L.   SYMPTOM SCALE - ILD 11/02/2020   O2 use ra  Shortness of Breath 0 -> 5 scale with 5 being worst (score 6 If unable to do)  At rest 0  Simple tasks - showers, clothes change, eating, shaving 2  Household (dishes, doing bed, laundry) 2  Shopping 4  Walking level at own pace 3  Walking up Stairs 3  Total (30-36) Dyspnea Score 14  How bad is your cough? 0  How bad is your fatigue 3  How bad is nausea 0  How bad is vomiting?  0  How bad is diarrhea? 2  How bad is anxiety? 1  How bad is depression 1        Simple office walk 185 feet x  3 laps goal with forehead probe 11/02/2020 - slow pace with cane   O2 used Ra (has o2 at  home since covid oct 2021 but this walk is RA)  Number laps completed 3   Comments about pace Slow with cane  Resting Pulse Ox/HR 100% and 94/min  Final Pulse Ox/HR 94% and 132/min  Desaturated </= 88% no  Desaturated <= 3% points Yes, 6 potnt  Got Tachycardic >/= 90/min yes  Symptoms at end of test Moderate dyspnea  Miscellaneous comments x      CXR Oct 2021  Narrative & Impression  CLINICAL DATA:  Hypoxia.  COVID positive   EXAM: PORTABLE CHEST 1 VIEW   COMPARISON:  02/13/2020   FINDINGS: Stable mild bilateral infiltrate and low lung volumes. No visible effusion or pneumothorax. Stable heart size.   IMPRESSION: Stable low volume chest with mild infiltrates.     Electronically  Signed   By: Marnee Spring M.D.   On: 02/20/2020 07:57     Jan 2022 CT at [facility]  IMPRESSION:  There is tubular bronchiectasis throughout, worst in the bilateral  lower lobes, with peripheral irregular and ground-glass airspace  opacity, including bandlike elements with subpleural sparing. This  appearance is consistent with chronic sequelae of organizing  pneumonia and particularly is in keeping with chronic post  infectious scarring related to prior COVID 19 airspace disease.    Electronically Signed    By: Lauralyn Primes M.D.    On: 05/16/2020 13:05   has a past medical history of Hypertension.  reports that he has never smoked. He has never used smokeless tobacco.  Past Surgical History:  Procedure Laterality Date   REVISION TOTAL HIP ARTHROPLASTY      No Known Allergies   There is no immunization history on file for this patient.  No family history on file.   Current Outpatient Medications:    acetaminophen (TYLENOL) 500 MG tablet, Take 500 mg by mouth every 6 (six) hours as needed for fever., Disp: , Rfl:    atorvastatin (LIPITOR) 10 MG tablet, Take 10 mg by mouth daily., Disp: , Rfl:    bismuth subsalicylate (PEPTO BISMOL) 262 MG/15ML suspension, Take 30 mLs by mouth every 6 (six) hours as needed for indigestion., Disp: , Rfl:    losartan-hydrochlorothiazide (HYZAAR) 100-12.5 MG tablet, Take 1 tablet by mouth daily., Disp: , Rfl:       Objective:   Vitals:   11/02/20 1603  BP: 120/80  Pulse: 94  SpO2: 100%  Weight: 263 lb 6.4 oz (119.5 kg)  Height: 6' (1.829 m)    Estimated body mass index is 35.72 kg/m as calculated from the following:   Height as of this encounter: 6' (1.829 m).   Weight as of this encounter: 263 lb 6.4 oz (119.5 kg).  @WEIGHTCHANGE @    11/02/20 1603  Weight: 263 lb 6.4 oz (119.5 kg)     Physical Exam  General Appearance:    Alert, cooperative, no distress, appears stated age - yes , Deconditioned  looking - yes , OBESE  - yes, Sitting on Wheelchair -  ni  Head:    Normocephalic, without obvious abnormality, atraumatic  Eyes:    PERRL, conjunctiva/corneas clear,  Ears:    Normal TM's and external ear canals, both ears  Nose:   Nares normal, septum midline, mucosa normal, no drainage    or sinus tenderness. OXYGEN ON  - no . Patient is @ ra But uses 2L with exertiona t home   Throat:   Lips, mucosa, and tongue normal; teeth and gums normal. Cyanosis on lips - no.Mallampatti class 3  Neck:   Supple, symmetrical, trachea midline, no adenopathy;    thyroid:  no enlargement/tenderness/nodules; no carotid   bruit or JVD  Back:     Symmetric, no curvature, ROM normal, no CVA tenderness  Lungs:     Distress - no , Wheeze no, Barrell Chest - no, Purse lip breathing - no, Crackles - no   Chest Wall:    No tenderness or deformity.    Heart:    Regular rate and rhythm, S1 and S2 normal, no rub   or gallop, Murmur - no  Breast Exam:    NOT DONE  Abdomen:     Soft, non-tender, bowel sounds active all four quadrants,    no masses, no organomegaly. Visceral obesity - yes  Genitalia:   NOT DONE  Rectal:   NOT DONE  Extremities:   Extremities - normal, Has Cane - no, Clubbing - no, Edema - no  Pulses:   2+ and symmetric all extremities  Skin:   Stigmata of Connective Tissue Disease - no  Lymph nodes:   Cervical, supraclavicular, and axillary nodes normal  Psychiatric:  Neurologic:   Pleasant - yes, Anxious - n, Flat affect - no  CAm-ICU - neg, Alert and Oriented x 3 - yes, Moves all 4s - yes, Speech - normal, Cognition - intact         Assessment:  ICD-10-CM   1. Obesity (BMI 30-39.9)  E66.9     2. History of snoring  Z87.898     3. Witnessed apneic spells  R06.81     4. History of 2019 novel coronavirus disease (COVID-19)  Z86.16     5. Post-COVID-19 syndrome manifesting as chronic dyspnea  R06.09    U09.9     6. Abnormal CT of the chest  R93.89     7. Physical  deconditioning  R53.81          Plan:     Patient Instructions  History of 2019 novel coronavirus disease (COVID-19) Post-COVID-19 syndrome manifesting as chronic dyspnea Abnormal CT of the chest  History of snoring Witnessed apneic spells Obesity (BMI 30-39.9)  Physical deconditioning  -Appreciate you switching her care to [place] I think is slowly getting better over time post COVID chronic lung disease but is still a ways to go. -The best way to approach this is to take a holistic approach and understand different components that continue to remain barriers to you getting better -and this includes potential heart issues, potential undiagnosed sleep apnea, and physical deconditioning -In addition current severity of lung disease post-COVID needs to be assessed -Currently noticed that you are on short-term disability through 01/03/2021  Plan - Refer to sleep doctor was in our practice - Get 2D echocardiogram - Get high-resolution CT chest supine and prone - Get full pulmonary function test -For now continue to use oxygen at night and with exertion - Continue pulmonary rehabilitation  Follow-up -Refer sleep doctor - July/August 2022 with nurse practitioner to review results but after completing all of the above -Return to see Dr. Marchelle Gearing in September 2022 for follow-up  -ILD symptom score and walking desaturation test at follow-up      SIGNATURE    Dr. Kalman Shan, M.D., F.C.C.P,  Pulmonary and Critical Care Medicine Staff Physician, [facility] Center Director - Interstitial Lung Disease  Program  Pulmonary Fibrosis [facility] at [facility] [place], [place], [address]  Pager: [phone], If no answer or between  15:00h - 7:00h: call [phone] Telephone: [phone]  4:50 PM 11/02/2020

## 2020-11-06 ENCOUNTER — Encounter (HOSPITAL_COMMUNITY): Payer: BC Managed Care – PPO

## 2020-11-06 ENCOUNTER — Telehealth (HOSPITAL_COMMUNITY): Payer: Self-pay | Admitting: Family Medicine

## 2020-11-08 ENCOUNTER — Encounter (HOSPITAL_COMMUNITY)
Admission: RE | Admit: 2020-11-08 | Discharge: 2020-11-08 | Disposition: A | Discharge status: Home / Self Care | Payer: BC Managed Care – PPO | Source: Ambulatory Visit | Attending: Cardiology | Admitting: Cardiology

## 2020-11-08 ENCOUNTER — Other Ambulatory Visit: Payer: Self-pay

## 2020-11-08 DIAGNOSIS — J470 Bronchiectasis with acute lower respiratory infection: Secondary | ICD-10-CM | POA: Insufficient documentation

## 2020-11-08 NOTE — Progress Notes (Signed)
Daily Session Note  Patient Details  Name: Darren Wood MRN: [id number] Date of Birth: April 05, 1961 Referring Provider:   April Manson Pulmonary Rehab Walk Test from 10/05/2020 in [place]  Referring Provider Dr. Camillo Flaming       Encounter Date: 11/08/2020  Check In:  Session Check In - 11/08/20 1508       Check-In   Supervising physician immediately available to respond to emergencies [facility] immediately available    Physician(s) Dr. Cruzita Lederer    Location [facility]    Staff Present Rosebud Poles, RN, Isaac Laud, MS, ACSM-CEP, Exercise Physiologist;Annedrea Rosezella [place], RN, Ramonita Lab, RN    Virtual Visit No    Medication changes reported     No    Fall or balance concerns reported    No    Tobacco Cessation No Change    Warm-up and Cool-down Performed as group-led instruction    Resistance Training Performed Yes    VAD Patient? No    PAD/SET Patient? No      Pain Assessment   Currently in Pain? No/denies    Multiple Pain Sites No             Capillary Blood Glucose: No results found for this or any previous visit (from the past 24 hour(s)).    Social History   Tobacco Use  Smoking Status Never  Smokeless Tobacco Never    Goals Met:  Proper associated with RPD/PD & O2 Sat Exercise tolerated well No report of cardiac concerns or symptoms Strength training completed today  Goals Unmet:  Not Applicable  Comments: Service time is from [address] to [place]    Dr. Fransico Him is Medical Director for Cardiac Rehab at [facility].

## 2020-11-13 ENCOUNTER — Other Ambulatory Visit: Payer: Self-pay

## 2020-11-13 ENCOUNTER — Encounter (HOSPITAL_COMMUNITY)
Admission: RE | Admit: 2020-11-13 | Discharge: 2020-11-13 | Disposition: A | Discharge status: Home / Self Care | Payer: BC Managed Care – PPO | Source: Ambulatory Visit | Attending: Cardiology | Admitting: Cardiology

## 2020-11-13 VITALS — Wt 267.0 lb

## 2020-11-13 DIAGNOSIS — J470 Bronchiectasis with acute lower respiratory infection: Secondary | ICD-10-CM | POA: Diagnosis not present

## 2020-11-13 NOTE — Progress Notes (Signed)
Daily Session Note  Patient Details  Name: Darren Wood MRN: [id number] Date of Birth: [birth year] Referring Provider:   April Manson Pulmonary Rehab Walk Test from 10/05/2020 in [place]  Referring Provider Dr. Camillo Flaming       Encounter Date: 11/13/2020  Check In:  Session Check In - 11/13/20 1508       Check-In   Supervising physician immediately available to respond to emergencies [facility] immediately available    Physician(s) Dr. Cruzita Lederer    Location [facility]    Staff Present Rosebud Poles, RN, Milus Glazier, MS, ACSM-CEP, CCRP, Exercise Physiologist;Jessica Hassell Done, MS, ACSM-CEP, Exercise Physiologist;Xylah Early Ysidro Evert, RN    Virtual Visit No    Medication changes reported     No    Fall or balance concerns reported    No    Tobacco Cessation No Change    Warm-up and Cool-down Performed as group-led instruction    Resistance Training Performed Yes    VAD Patient? No    PAD/SET Patient? No      Pain Assessment   Currently in Pain? No/denies    Multiple Pain Sites No             Capillary Blood Glucose: No results found for this or any previous visit (from the past 24 hour(s)).   Exercise Prescription Changes - 11/13/20 1500       Response to Exercise   Blood Pressure (Admit) 132/86    Blood Pressure (Exercise) 150/88    Blood Pressure (Exit) 124/74    Heart Rate (Admit) 94 bpm    Heart Rate (Exercise) 115 bpm    Heart Rate (Exit) 104 bpm    Oxygen Saturation (Admit) 99 %    Oxygen Saturation (Exercise) 94 %    Oxygen Saturation (Exit) 99 %    Rating of Perceived Exertion (Exercise) 12    Perceived Dyspnea (Exercise) 0    Duration Continue with 30 min of aerobic exercise without signs/symptoms of physical distress.    Intensity THRR unchanged      Progression   Progression Continue to progress workloads to maintain intensity without signs/symptoms of physical distress.      Resistance Training    Training Prescription Yes    Weight blue bands    Reps 10-15    Time 10 Minutes      Oxygen   Oxygen Continuous    Liters 2L      NuStep   Level 5    SPM 80    Minutes 15    METs 2.4      Arm Ergometer   Level 3.5    Watts 41    Minutes 15             Social History   Tobacco Use  Smoking Status Never  Smokeless Tobacco Never    Goals Met:  Exercise tolerated well No report of cardiac concerns or symptoms Strength training completed today  Goals Unmet:  Not Applicable  Comments: Service time is from 1325 to 1430    Dr. Fransico Him is Medical Director for Cardiac Rehab at [facility].

## 2020-11-15 ENCOUNTER — Other Ambulatory Visit: Payer: Self-pay

## 2020-11-15 ENCOUNTER — Encounter (HOSPITAL_COMMUNITY)
Admission: RE | Admit: 2020-11-15 | Discharge: 2020-11-15 | Disposition: A | Discharge status: Home / Self Care | Payer: BC Managed Care – PPO | Source: Ambulatory Visit | Attending: Cardiology | Admitting: Cardiology

## 2020-11-15 DIAGNOSIS — J470 Bronchiectasis with acute lower respiratory infection: Secondary | ICD-10-CM | POA: Diagnosis not present

## 2020-11-15 NOTE — Progress Notes (Signed)
Daily Session Note  Patient Details  Name: Darren Wood MRN: [id number] Date of Birth: [birth year] Referring Provider:   April Manson Pulmonary Rehab Walk Test from 10/05/2020 in [place]  Referring Provider Dr. Camillo Flaming       Encounter Date: 11/15/2020  Check In:  Session Check In - 11/15/20 1453       Check-In   Supervising physician immediately available to respond to emergencies [facility] immediately available    Physician(s) Dr. Kurtis Bushman    Location [facility]    Staff Present Lesly Rubenstein, MS, ACSM-CEP, CCRP, Exercise Physiologist;Jessica Hassell Done, MS, ACSM-CEP, Exercise Physiologist;Lisa Clarisa Schools, MS, ACSM-CEP, Exercise Physiologist    Virtual Visit No    Medication changes reported     No    Fall or balance concerns reported    No    Tobacco Cessation No Change    Warm-up and Cool-down Performed as group-led instruction    Resistance Training Performed Yes    VAD Patient? No    PAD/SET Patient? No      Pain Assessment   Currently in Pain? No/denies    Multiple Pain Sites No             Capillary Blood Glucose: No results found for this or any previous visit (from the past 24 hour(s)).    Social History   Tobacco Use  Smoking Status Never  Smokeless Tobacco Never    Goals Met:  Exercise tolerated well No report of cardiac concerns or symptoms Strength training completed today  Goals Unmet:  Not Applicable  Comments: Service time is from 1315 to 1445    Dr. Fransico Him is Medical Director for Cardiac Rehab at [facility].

## 2020-11-20 ENCOUNTER — Other Ambulatory Visit: Payer: Self-pay

## 2020-11-20 ENCOUNTER — Encounter (HOSPITAL_COMMUNITY)
Admission: RE | Admit: 2020-11-20 | Discharge: 2020-11-20 | Disposition: A | Discharge status: Home / Self Care | Payer: BC Managed Care – PPO | Source: Ambulatory Visit | Attending: Cardiology | Admitting: Cardiology

## 2020-11-20 DIAGNOSIS — J470 Bronchiectasis with acute lower respiratory infection: Secondary | ICD-10-CM | POA: Diagnosis not present

## 2020-11-20 NOTE — Progress Notes (Signed)
Pulmonary Individual Treatment Plan  Patient Details  Name: Darren Wood MRN: [id number] Date of Birth: [birth year] Referring Provider:   April Manson Pulmonary Rehab Walk Test from 10/05/2020 in [facility]  Referring Provider Dr. Camillo Flaming       Initial Encounter Date:  Flowsheet Row Pulmonary Rehab Walk Test from 10/05/2020 in [place]  Date 10/05/20       Visit Diagnosis: Bronchiectasis with acute lower respiratory infection ([place])  Patient's Home Medications on Admission:   Current Outpatient Medications:    acetaminophen (TYLENOL) 500 MG tablet, Take 500 mg by mouth every 6 (six) hours as needed for fever., Disp: , Rfl:    atorvastatin (LIPITOR) 10 MG tablet, Take 10 mg by mouth daily., Disp: , Rfl:    bismuth subsalicylate (PEPTO BISMOL) 262 MG/15ML suspension, Take 30 mLs by mouth every 6 (six) hours as needed for indigestion., Disp: , Rfl:    losartan-hydrochlorothiazide (HYZAAR) 100-12.5 MG tablet, Take 1 tablet by mouth daily., Disp: , Rfl:   Past Medical History: Past Medical History:  Diagnosis Date   Hypertension     Tobacco Use: Social History   Tobacco Use  Smoking Status Never  Smokeless Tobacco Never    Labs: Recent Review Flowsheet Data     Labs for ITP Cardiac and Pulmonary Rehab Latest Ref Rng & Units 02/13/2020   Trlycerides <150 mg/dL 151(H)       Capillary Blood Glucose: No results found for: GLUCAP   Pulmonary Assessment Scores:  Pulmonary Assessment Scores     Row Name 10/05/20 1530         ADL UCSD   ADL Phase Entry     SOB Score total 46           CAT Score     CAT Score 17           mMRC Score     mMRC Score 4            UCSD: Self-administered rating of dyspnea associated with activities of daily living (ADLs) 6-point scale (0 = "not at all" to 5 = "maximal or unable to do because of breathlessness")  Scoring Scores range from 0 to 120.  Minimally  important difference is 5 units  CAT: CAT can identify the health impairment of COPD patients and is better correlated with disease progression.  CAT has a scoring range of zero to 40. The CAT score is classified into four groups of low (less than 10), medium (10 - 20), high (21-30) and very high (31-40) based on the impact level of disease on health status. A CAT score over 10 suggests significant symptoms.  A worsening CAT score could be explained by an exacerbation, poor medication adherence, poor inhaler technique, or progression of COPD or comorbid conditions.  CAT MCID is 2 points  mMRC: mMRC (Modified Medical Research Council) Dyspnea Scale is used to assess the degree of baseline functional disability in patients of respiratory disease due to dyspnea. No minimal important difference is established. A decrease in score of 1 point or greater is considered a positive change.   Pulmonary Function Assessment:  Pulmonary Function Assessment - 10/05/20 1408       Breath   Shortness of Breath Yes;Limiting activity             Exercise Target Goals: Exercise Program Goal: Individual exercise prescription set using results from initial 6 min walk test and THRR while considering  patient's activity barriers and safety.   Exercise Prescription Goal: Initial exercise prescription builds to 30-45 minutes a day of aerobic activity, 2-3 days per week.  Home exercise guidelines will be given to patient during program as part of exercise prescription that the participant will acknowledge.  Activity Barriers & Risk Stratification:  Activity Barriers & Cardiac Risk Stratification - 10/05/20 1402       Activity Barriers & Cardiac Risk Stratification   Activity Barriers Left Hip Replacement;Shortness of Breath;Arthritis             6 Minute Walk:  6 Minute Walk     Row Name 10/05/20 1518         6 Minute Walk   Phase Initial     Distance 800 feet     Walk Time 6 minutes     #  of Rest Breaks 0     MPH 1.52     METS 2.29     RPE 12     Perceived Dyspnea  1     VO2 Peak 8.03     Symptoms Yes (comment)     Comments Hip and knee pain 5/10-chronic     Resting HR 91 bpm     Resting BP 124/86     Resting Oxygen Saturation  98 %     Exercise Oxygen Saturation  during 6 min walk 92 %     Max Ex. HR 132 bpm     Max Ex. BP 144/80     2 Minute Post BP 132/82           Interval HR     1 Minute HR 123     2 Minute HR 124     3 Minute HR 125     4 Minute HR 130     5 Minute HR 130     6 Minute HR 132     2 Minute Post HR 109     Interval Heart Rate? Yes           Interval Oxygen     Interval Oxygen? Yes     Baseline Oxygen Saturation % 98 %     1 Minute Oxygen Saturation % 93 %     1 Minute Liters of Oxygen 2 L     2 Minute Oxygen Saturation % 96 %     2 Minute Liters of Oxygen 2 L     3 Minute Oxygen Saturation % 96 %     3 Minute Liters of Oxygen 2 L     4 Minute Oxygen Saturation % 94 %     4 Minute Liters of Oxygen 2 L     5 Minute Oxygen Saturation % 95 %     5 Minute Liters of Oxygen 2 L     6 Minute Oxygen Saturation % 92 %     6 Minute Liters of Oxygen 2 L     2 Minute Post Oxygen Saturation % 99 %     2 Minute Post Liters of Oxygen 2 L             Oxygen Initial Assessment:  Oxygen Initial Assessment - 10/05/20 1406       Home Oxygen   Home Oxygen Device Portable Concentrator;Home Concentrator    Sleep Oxygen Prescription None    Home Exercise Oxygen Prescription Pulsed    Liters per minute 3    Home Resting Oxygen Prescription None    Compliance  with Home Oxygen Use Yes      Initial 6 min Walk   Oxygen Used Continuous    Liters per minute 2      Program Oxygen Prescription   Program Oxygen Prescription Continuous    Liters per minute 2      Intervention   Short Term Goals To learn and exhibit compliance with exercise, home and travel O2 prescription;To learn and understand importance of monitoring SPO2 with pulse oximeter  and demonstrate accurate use of the pulse oximeter.;To learn and understand importance of maintaining oxygen saturations>88%;To learn and demonstrate proper pursed lip breathing techniques or other breathing techniques. ;To learn and demonstrate proper use of respiratory medications    Long  Term Goals Exhibits compliance with exercise, home  and travel O2 prescription;Verbalizes importance of monitoring SPO2 with pulse oximeter and return demonstration;Maintenance of O2 saturations>88%;Exhibits proper breathing techniques, such as pursed lip breathing or other method taught during program session;Compliance with respiratory medication;Demonstrates proper use of MDI's             Oxygen Re-Evaluation:  Oxygen Re-Evaluation     Row Name 10/22/20 1331 11/19/20 1521           Program Oxygen Prescription   Program Oxygen Prescription Continuous Continuous      Liters per minute 2 2             Home Oxygen      Home Oxygen Device Portable Concentrator;Home Concentrator Portable Concentrator;Home Concentrator      Sleep Oxygen Prescription None None      Home Exercise Oxygen Prescription Pulsed Pulsed      Liters per minute 3 3      Home Resting Oxygen Prescription None None      Compliance with Home Oxygen Use Yes Yes             Goals/Expected Outcomes      Short Term Goals To learn and exhibit compliance with exercise, home and travel O2 prescription;To learn and understand importance of monitoring SPO2 with pulse oximeter and demonstrate accurate use of the pulse oximeter.;To learn and understand importance of maintaining oxygen saturations>88%;To learn and demonstrate proper pursed lip breathing techniques or other breathing techniques. ;To learn and demonstrate proper use of respiratory medications To learn and exhibit compliance with exercise, home and travel O2 prescription;To learn and understand importance of monitoring SPO2 with pulse oximeter and demonstrate accurate use of the  pulse oximeter.;To learn and understand importance of maintaining oxygen saturations>88%;To learn and demonstrate proper pursed lip breathing techniques or other breathing techniques. ;To learn and demonstrate proper use of respiratory medications      Long  Term Goals Exhibits compliance with exercise, home  and travel O2 prescription;Verbalizes importance of monitoring SPO2 with pulse oximeter and return demonstration;Maintenance of O2 saturations>88%;Exhibits proper breathing techniques, such as pursed lip breathing or other method taught during program session;Compliance with respiratory medication;Demonstrates proper use of MDI's Exhibits compliance with exercise, home  and travel O2 prescription;Verbalizes importance of monitoring SPO2 with pulse oximeter and return demonstration;Maintenance of O2 saturations>88%;Exhibits proper breathing techniques, such as pursed lip breathing or other method taught during program session;Compliance with respiratory medication;Demonstrates proper use of MDI's      Goals/Expected Outcomes compliance and understanding of monitoring oxygen saturation and performing pursed lip breathing when needed. compliance and understanding of monitoring oxygen saturation and performing pursed lip breathing when needed.              Oxygen Discharge (Final  Oxygen Re-Evaluation):  Oxygen Re-Evaluation - 11/19/20 1521       Program Oxygen Prescription   Program Oxygen Prescription Continuous    Liters per minute 2      Home Oxygen   Home Oxygen Device Portable Concentrator;Home Concentrator    Sleep Oxygen Prescription None    Home Exercise Oxygen Prescription Pulsed    Liters per minute 3    Home Resting Oxygen Prescription None    Compliance with Home Oxygen Use Yes      Goals/Expected Outcomes   Short Term Goals To learn and exhibit compliance with exercise, home and travel O2 prescription;To learn and understand importance of monitoring SPO2 with pulse oximeter  and demonstrate accurate use of the pulse oximeter.;To learn and understand importance of maintaining oxygen saturations>88%;To learn and demonstrate proper pursed lip breathing techniques or other breathing techniques. ;To learn and demonstrate proper use of respiratory medications    Long  Term Goals Exhibits compliance with exercise, home  and travel O2 prescription;Verbalizes importance of monitoring SPO2 with pulse oximeter and return demonstration;Maintenance of O2 saturations>88%;Exhibits proper breathing techniques, such as pursed lip breathing or other method taught during program session;Compliance with respiratory medication;Demonstrates proper use of MDI's    Goals/Expected Outcomes compliance and understanding of monitoring oxygen saturation and performing pursed lip breathing when needed.             Initial Exercise Prescription:  Initial Exercise Prescription - 10/05/20 1500       Date of Initial Exercise RX and Referring Provider   Date 10/05/20    Referring Provider Dr. Camillo Flaming    Expected Discharge Date 12/06/20      Oxygen   Oxygen Continuous    Liters 2      NuStep   Level 1    SPM 80    Minutes 15      Arm Ergometer   Level 1    Minutes 15      Intensity   THRR 40-80% of Max Heartrate 63-126    Ratings of Perceived Exertion 11-13    Perceived Dyspnea 0-4      Progression   Progression Continue to progress workloads to maintain intensity without signs/symptoms of physical distress.      Resistance Training   Training Prescription Yes    Weight Blue bands    Reps 10-15             Perform Capillary Blood Glucose checks as needed.  Exercise Prescription Changes:   Exercise Prescription Changes     Row Name 10/16/20 1400 10/23/20 1500 10/30/20 1500 11/13/20 1500       Response to Exercise   Blood Pressure (Admit) 140/80 -- 124/84 132/86    Blood Pressure (Exercise) 154/82 -- 138/72 150/88    Blood Pressure (Exit) 126/84 -- 90/60  BP after  exercise 90/50 gave pt water to drink, slowly improvered 106/71. 124/74    Heart Rate (Admit) 94 bpm -- 113 bpm 94 bpm    Heart Rate (Exercise) 111 bpm -- 120 bpm 115 bpm    Heart Rate (Exit) 105 bpm -- 112 bpm 104 bpm    Oxygen Saturation (Admit) 99 % -- 98 % 99 %    Oxygen Saturation (Exercise) 111 % -- 95 % 94 %    Oxygen Saturation (Exit) 105 % -- 98 % 99 %    Rating of Perceived Exertion (Exercise) 11 -- 11 12    Perceived Dyspnea (Exercise) 0 -- 1 0  Duration Continue with 30 min of aerobic exercise without signs/symptoms of physical distress. -- Continue with 30 min of aerobic exercise without signs/symptoms of physical distress. Continue with 30 min of aerobic exercise without signs/symptoms of physical distress.    Intensity THRR unchanged -- THRR unchanged THRR unchanged         Progression        Progression Continue to progress workloads to maintain intensity without signs/symptoms of physical distress. -- Continue to progress workloads to maintain intensity without signs/symptoms of physical distress. Continue to progress workloads to maintain intensity without signs/symptoms of physical distress.    Average METs 1.8 -- -- --         Resistance Training        Training Prescription Yes -- Yes Yes    Weight Blue bands -- blue bands blue bands    Reps 10-15 -- 10-15 10-15    Time 10 Minutes -- 10 Minutes 10 Minutes         Oxygen        Oxygen Continuous -- Continuous Continuous    Liters 2 -- -- 2L         NuStep        Level 3 -- 4 5    SPM 80 -- 80 80    Minutes 30 -- 150 15    METs 1.8 -- 2 2.4         Arm Ergometer        Level -- -- 4 3.5    Watts -- -- 43 41    Minutes -- -- 15 15         Home Exercise Plan        Plans to continue exercise at -- Home (comment)  Walking, resistance training, and stretches -- --    Frequency -- Add 2 additional days to program exercise sessions. -- --    Initial Home Exercises Provided -- 10/23/20 -- --             Exercise Comments:   Exercise Comments     Row Name 10/09/20 1452 10/23/20 1524         Exercise Comments Patient completed first day of exercise and tolerated well with no complaints or concerns. He does have chronic hip and knee pain but stated that it was not agravated any worse than normal during exercise. He was able to do 15 minutes on the Nustep and 15 minutes on the arm ergometer with no rest breaks. Will continue to monitor. Completed home exercise with patient. Pt currently does resistance training and does physical therapy exercise 2 days a week but not every week. We discussed aiming to do some type of exercise at least 2 other days outside of rehab. We discussed adding walking to his exercise prescription. Pt was receptive and stated that he would start this week.               Exercise Goals and Review:   Exercise Goals     Row Name 10/05/20 1515             Exercise Goals   Increase Physical Activity Yes       Intervention Provide advice, education, support and counseling about physical activity/exercise needs.;Develop an individualized exercise prescription for aerobic and resistive training based on initial evaluation findings, risk stratification, comorbidities and participant's personal goals.       Expected Outcomes Short Term: Attend rehab on a regular basis to increase  amount of physical activity.;Long Term: Add in home exercise to make exercise part of routine and to increase amount of physical activity.;Long Term: Exercising regularly at least 3-5 days a week.       Increase Strength and Stamina Yes       Intervention Provide advice, education, support and counseling about physical activity/exercise needs.;Develop an individualized exercise prescription for aerobic and resistive training based on initial evaluation findings, risk stratification, comorbidities and participant's personal goals.       Expected Outcomes Short Term: Increase workloads from  initial exercise prescription for resistance, speed, and METs.;Short Term: Perform resistance training exercises routinely during rehab and add in resistance training at home;Long Term: Improve cardiorespiratory fitness, muscular endurance and strength as measured by increased METs and functional capacity (6MWT)       Able to understand and use rate of perceived exertion (RPE) scale Yes       Intervention Provide education and explanation on how to use RPE scale       Expected Outcomes Short Term: Able to use RPE daily in rehab to express subjective intensity level;Long Term:  Able to use RPE to guide intensity level when exercising independently       Able to understand and use Dyspnea scale Yes       Intervention Provide education and explanation on how to use Dyspnea scale       Expected Outcomes Short Term: Able to use Dyspnea scale daily in rehab to express subjective sense of shortness of breath during exertion;Long Term: Able to use Dyspnea scale to guide intensity level when exercising independently       Knowledge and understanding of Target Heart Rate Range (THRR) Yes       Intervention Provide education and explanation of THRR including how the numbers were predicted and where they are located for reference       Expected Outcomes Short Term: Able to state/look up THRR;Long Term: Able to use THRR to govern intensity when exercising independently;Short Term: Able to use daily as guideline for intensity in rehab       Understanding of Exercise Prescription Yes       Intervention Provide education, explanation, and written materials on patient's individual exercise prescription       Expected Outcomes Short Term: Able to explain program exercise prescription;Long Term: Able to explain home exercise prescription to exercise independently                Exercise Goals Re-Evaluation :  Exercise Goals Re-Evaluation     Row Name 10/22/20 1326 11/19/20 1517           Exercise Goal  Re-Evaluation   Exercise Goals Review Increase Physical Activity;Increase Strength and Stamina;Able to understand and use rate of perceived exertion (RPE) scale;Able to understand and use Dyspnea scale;Knowledge and understanding of Target Heart Rate Range (THRR);Understanding of Exercise Prescription Increase Physical Activity;Increase Strength and Stamina;Able to understand and use rate of perceived exertion (RPE) scale;Able to understand and use Dyspnea scale;Knowledge and understanding of Target Heart Rate Range (THRR);Understanding of Exercise Prescription      Comments Darren Wood has completed 4 exercise sessions and has tolerated well so far. He does have chronic hip pain that causes him to walk with a limp and he uses a cane for assistance. He has not complained of exercise making the pain any worse so far. He is able to do 15 minutes on the Nustep and 15 minutes on the arm ergometer without taking any  rest breaks. In the short amount of time that he has been in the program he has already progressed with workload and MET level increases. He is exercising at 2.0 METS on the Nustep and 42 RPM's on the arm ergometer. Will continue to monitor and progress as he is able. Darren Wood has completed 10 exercise sessions and has been consistent with workload and MET level increases. His attendance has been good and it seems like he is enjoying the exercise. We have discussed home exercise and he has started exercising at home at least 2 days a week. He is exercising at 2.3 METS on the Nustep and 44 RPM's on Level 3.5 on the Arm Ergometer. Will continue to monitor and progress as he is able.      Expected Outcomes Through exercise at rehab and home, the patient will decrease shortness of breath with daily activities and feel confident in carrying out an exercise regimn at home. Through exercise at rehab and home, the patient will decrease shortness of breath with daily activities and feel confident in carrying out an  exercise regimn at home.               Discharge Exercise Prescription (Final Exercise Prescription Changes):  Exercise Prescription Changes - 11/13/20 1500       Response to Exercise   Blood Pressure (Admit) 132/86    Blood Pressure (Exercise) 150/88    Blood Pressure (Exit) 124/74    Heart Rate (Admit) 94 bpm    Heart Rate (Exercise) 115 bpm    Heart Rate (Exit) 104 bpm    Oxygen Saturation (Admit) 99 %    Oxygen Saturation (Exercise) 94 %    Oxygen Saturation (Exit) 99 %    Rating of Perceived Exertion (Exercise) 12    Perceived Dyspnea (Exercise) 0    Duration Continue with 30 min of aerobic exercise without signs/symptoms of physical distress.    Intensity THRR unchanged      Progression   Progression Continue to progress workloads to maintain intensity without signs/symptoms of physical distress.      Resistance Training   Training Prescription Yes    Weight blue bands    Reps 10-15    Time 10 Minutes      Oxygen   Oxygen Continuous    Liters 2L      NuStep   Level 5    SPM 80    Minutes 15    METs 2.4      Arm Ergometer   Level 3.5    Watts 41    Minutes 15             Nutrition:  Target Goals: Understanding of nutrition guidelines, daily intake of sodium <1541m, cholesterol <2050m calories 30% from fat and 7% or less from saturated fats, daily to have 5 or more servings of fruits and vegetables.  Biometrics:  Pre Biometrics - 10/05/20 1515       Pre Biometrics   Grip Strength 37 kg              Nutrition Therapy Plan and Nutrition Goals:  Nutrition Therapy & Goals - 10/17/20 0808       Nutrition Therapy   Diet General Healthful    Drug/Food Interactions Statins/Certain Fruits      Personal Nutrition Goals   Nutrition Goal Pt to identify food quantities necessary to achieve weight loss of 6-24 lb at graduation from cardiac rehab.    Personal Goal #2 Pt to  build a healthy plate including vegetables, fruits, whole grains, and  low-fat dairy products in a heart healthy meal plan.    Personal Goal #3 Pt to learn how to incorporate more nutrient dense foods into diet by tracking foods and reading food labels to understand foods he is eating      Intervention Plan   Intervention Prescribe, educate and counsel regarding individualized specific dietary modifications aiming towards targeted core components such as weight, hypertension, lipid management, diabetes, heart failure and other comorbidities.;Nutrition handout(s) given to patient.    Expected Outcomes Long Term Goal: Adherence to prescribed nutrition plan.;Short Term Goal: A plan has been developed with personal nutrition goals set during dietitian appointment.             Nutrition Assessments:  MEDIFICTS Score Key: ?70 Need to make dietary changes  40-70 Heart Healthy Diet ? 40 Therapeutic Level Cholesterol Diet  Flowsheet Row PULMONARY REHAB OTHER RESPIRATORY from 10/16/2020 in [place]  Picture Your Plate Total Score on Admission 41      Picture Your Plate Scores: <85 Unhealthy dietary pattern with much room for improvement. 41-50 Dietary pattern unlikely to meet recommendations for good health and room for improvement. 51-60 More healthful dietary pattern, with some room for improvement.  >60 Healthy dietary pattern, although there may be some specific behaviors that could be improved.    Nutrition Goals Re-Evaluation:  Nutrition Goals Re-Evaluation     Darren Wood Name 10/17/20 0808 10/23/20 0919 11/19/20 1425         Goals   Current Weight 266 lb (120.7 kg) 266 lb 15.6 oz (121.1 kg) 266 lb 15.6 oz (121.1 kg)     Nutrition Goal -- Pt to identify food quantities necessary to achieve weight loss of 6-24 lb at graduation from cardiac rehab. Pt to identify food quantities necessary to achieve weight loss of 6-24 lb at graduation from cardiac rehab.           Personal Goal #2 Re-Evaluation       Personal Goal #2 --  Pt to build a healthy plate including vegetables, fruits, whole grains, and low-fat dairy products in a heart healthy meal plan. Pt to build a healthy plate including vegetables, fruits, whole grains, and low-fat dairy products in a heart healthy meal plan.           Personal Goal #3 Re-Evaluation       Personal Goal #3 -- Pt to learn how to incorporate more nutrient dense foods into diet by tracking foods and reading food labels to understand foods he is eating Pt to learn how to incorporate more nutrient dense foods into diet by tracking foods and reading food labels to understand foods he is eating             Nutrition Goals Discharge (Final Nutrition Goals Re-Evaluation):  Nutrition Goals Re-Evaluation - 11/19/20 1425       Goals   Current Weight 266 lb 15.6 oz (121.1 kg)    Nutrition Goal Pt to identify food quantities necessary to achieve weight loss of 6-24 lb at graduation from cardiac rehab.      Personal Goal #2 Re-Evaluation   Personal Goal #2 Pt to build a healthy plate including vegetables, fruits, whole grains, and low-fat dairy products in a heart healthy meal plan.      Personal Goal #3 Re-Evaluation   Personal Goal #3 Pt to learn how to incorporate more nutrient dense foods into diet by  tracking foods and reading food labels to understand foods he is eating             Psychosocial: Target Goals: Acknowledge presence or absence of significant depression and/or stress, maximize coping skills, provide positive support system. Participant is able to verbalize types and ability to use techniques and skills needed for reducing stress and depression.  Initial Review & Psychosocial Screening:  Initial Psych Review & Screening - 10/05/20 1409       Initial Review   Current issues with None Identified      Family Dynamics   Good Support System? Yes   Wife, daughter, son, sister, and church family     Barriers   Psychosocial barriers to participate in program The  patient should benefit from training in stress management and relaxation.      Screening Interventions   Interventions Encouraged to exercise    Expected Outcomes Long Term Goal: Stressors or current issues are controlled or eliminated.;Long Term goal: The participant improves quality of Life and PHQ9 Scores as seen by post scores and/or verbalization of changes             Quality of Life Scores:  Scores of 19 and below usually indicate a poorer quality of life in these areas.  A difference of  2-3 points is a clinically meaningful difference.  A difference of 2-3 points in the total score of the Quality of Life Index has been associated with significant improvement in overall quality of life, self-image, physical symptoms, and general health in studies assessing change in quality of life.  PHQ-9: Recent Review Flowsheet Data     Depression screen [facility] 2/9 10/05/2020   Decreased Interest 0   Down, Depressed, Hopeless 1   PHQ - 2 Score 1   Altered sleeping 0   Tired, decreased energy 2   Change in appetite 0   Feeling bad or failure about yourself  0   Trouble concentrating 0   Moving slowly or fidgety/restless 0   Suicidal thoughts 0   Difficult doing work/chores Not difficult at all      Interpretation of Total Score  Total Score Depression Severity:  1-4 = Minimal depression, 5-9 = Mild depression, 10-14 = Moderate depression, 15-19 = Moderately severe depression, 20-27 = Severe depression   Psychosocial Evaluation and Intervention:  Psychosocial Evaluation - 10/05/20 1521       Psychosocial Evaluation & Interventions   Interventions Encouraged to exercise with the program and follow exercise prescription    Comments Patient does not show any psychosocial barriers and seems to have a positive outlook on life. He has a great support system that consists of family and church members.    Expected Outcomes Pt to continue positive outlook and continued mental well being  through healthy coping mechanisms    Continue Psychosocial Services  No Follow up required             Psychosocial Re-Evaluation:  Psychosocial Re-Evaluation     Darren Wood Name 10/22/20 1018 11/19/20 1421           Psychosocial Re-Evaluation   Current issues with None Identified None Identified      Comments No psychosocial concerns identified at this time. No concerns identified.      Expected Outcomes For Darren Wood to continue to be free of psychosocial concerns while particpating in pulmonary rehab. Continue with no psychosocial concerns while participating in pulmonary rehab.      Interventions Encouraged to attend Pulmonary  Rehabilitation for the exercise Encouraged to attend Pulmonary Rehabilitation for the exercise      Continue Psychosocial Services  No Follow up required No Follow up required               Psychosocial Discharge (Final Psychosocial Re-Evaluation):  Psychosocial Re-Evaluation - 11/19/20 1421       Psychosocial Re-Evaluation   Current issues with None Identified    Comments No concerns identified.    Expected Outcomes Continue with no psychosocial concerns while participating in pulmonary rehab.    Interventions Encouraged to attend Pulmonary Rehabilitation for the exercise    Continue Psychosocial Services  No Follow up required             Education: Education Goals: Education classes will be provided on a weekly basis, covering required topics. Participant will state understanding/return demonstration of topics presented.  Learning Barriers/Preferences:  Learning Barriers/Preferences - 10/05/20 1410       Learning Barriers/Preferences   Learning Barriers None   Wears reading glasses   Learning Preferences Written Material;Skilled Demonstration;Individual Instruction             Education Topics: Risk Factor Reduction:  -Group instruction that is supported by a PowerPoint presentation. Instructor discusses the definition of a risk  factor, different risk factors for pulmonary disease, and how the heart and lungs work together.     Nutrition for Pulmonary Patient:  -Group instruction provided by PowerPoint slides, verbal discussion, and written materials to support subject matter. The instructor gives an explanation and review of healthy diet recommendations, which includes a discussion on weight management, recommendations for fruit and vegetable consumption, as well as protein, fluid, caffeine, fiber, sodium, sugar, and alcohol. Tips for eating when patients are short of breath are discussed. Flowsheet Row PULMONARY REHAB OTHER RESPIRATORY from 11/15/2020 in [place]  Date 10/18/20  Educator handout       Pursed Lip Breathing:  -Group instruction that is supported by demonstration and informational handouts. Instructor discusses the benefits of pursed lip and diaphragmatic breathing and detailed demonstration on how to preform both.     Oxygen Safety:  -Group instruction provided by PowerPoint, verbal discussion, and written material to support subject matter. There is an overview of "What is Oxygen" and "Why do we need it".  Instructor also reviews how to create a safe environment for oxygen use, the importance of using oxygen as prescribed, and the risks of noncompliance. There is a brief discussion on traveling with oxygen and resources the patient may utilize. Flowsheet Row PULMONARY REHAB OTHER RESPIRATORY from 11/15/2020 in [place]  Date 10/11/20  Educator Handout       Oxygen Equipment:  -Group instruction provided by [facility] Staff utilizing handouts, written materials, and equipment demonstrations.   Signs and Symptoms:  -Group instruction provided by written material and verbal discussion to support subject matter. Warning signs and symptoms of infection, stroke, and heart attack are reviewed and when to call the physician/911  reinforced. Tips for preventing the spread of infection discussed.   Advanced Directives:  -Group instruction provided by verbal instruction and written material to support subject matter. Instructor reviews Advanced Directive laws and proper instruction for filling out document.   Pulmonary Video:  -Group video education that reviews the importance of medication and oxygen compliance, exercise, good nutrition, pulmonary hygiene, and pursed lip and diaphragmatic breathing for the pulmonary patient.   Exercise for the Pulmonary Patient:  -Group  instruction that is supported by a PowerPoint presentation. Instructor discusses benefits of exercise, core components of exercise, frequency, duration, and intensity of an exercise routine, importance of utilizing pulse oximetry during exercise, safety while exercising, and options of places to exercise outside of rehab.   Flowsheet Row PULMONARY REHAB OTHER RESPIRATORY from 11/15/2020 in [place]  Date 11/15/20  Educator handout       Pulmonary Medications:  -Verbally interactive group education provided by instructor with focus on inhaled medications and proper administration.   Anatomy and Physiology of the Respiratory System and Intimacy:  -Group instruction provided by PowerPoint, verbal discussion, and written material to support subject matter. Instructor reviews respiratory cycle and anatomical components of the respiratory system and their functions. Instructor also reviews differences in obstructive and restrictive respiratory diseases with examples of each. Intimacy, Sex, and Sexuality differences are reviewed with a discussion on how relationships can change when diagnosed with pulmonary disease. Common sexual concerns are reviewed.   MD DAY -A group question and answer session with a medical doctor that allows participants to ask questions that relate to their pulmonary disease state.   OTHER  EDUCATION -Group or individual verbal, written, or video instructions that support the educational goals of the pulmonary rehab program. Darren Wood from 11/15/2020 in [place]  Date 11/08/20  [facility]]  Educator handout       Holiday Eating Survival Tips:  -Group instruction provided by PowerPoint slides, verbal discussion, and written materials to support subject matter. The instructor gives patients tips, tricks, and techniques to help them not only survive but enjoy the holidays despite the onslaught of food that accompanies the holidays.   Knowledge Questionnaire Score:  Knowledge Questionnaire Score - 10/05/20 1528       Knowledge Questionnaire Score   Pre Score 5/8   Did not answer questions on back of sheet            Core Components/Risk Factors/Patient Goals at Admission:  Personal Goals and Risk Factors at Admission - 10/05/20 1524       Core Components/Risk Factors/Patient Goals on Admission   Improve shortness of breath with ADL's Yes    Intervention Provide education, individualized exercise plan and daily activity instruction to help decrease symptoms of SOB with activities of daily living.    Expected Outcomes Short Term: Improve cardiorespiratory fitness to achieve a reduction of symptoms when performing ADLs;Long Term: Be able to perform more ADLs without symptoms or delay the onset of symptoms    Hypertension Yes    Intervention Provide education on lifestyle modifcations including regular physical activity/exercise, weight management, moderate sodium restriction and increased consumption of fresh fruit, vegetables, and low fat dairy, alcohol moderation, and smoking cessation.;Monitor prescription use compliance.    Expected Outcomes Short Term: Continued assessment and intervention until BP is < 140/14m HG in hypertensive participants. < 130/845mHG in hypertensive participants with  diabetes, heart failure or chronic kidney disease.;Long Term: Maintenance of blood pressure at goal levels.             Core Components/Risk Factors/Patient Goals Review:   Goals and Risk Factor Review     Row Name 10/22/20 1019 11/19/20 1422           Core Components/Risk Factors/Patient Goals Review   Personal Goals Review Develop more efficient breathing techniques such as purse lipped breathing and diaphragmatic breathing and practicing self-pacing with activity.;Increase knowledge of  respiratory medications and ability to use respiratory devices properly.;Improve shortness of breath with ADL's Develop more efficient breathing techniques such as purse lipped breathing and diaphragmatic breathing and practicing self-pacing with activity.;Increase knowledge of respiratory medications and ability to use respiratory devices properly.;Improve shortness of breath with ADL's      Review Darren Wood started pulmonary rehab 2 weeks ago, it is too early to see progression toward program goals.  He is doing well and exercising at 2.0 mets on the nustep and 47 watts on the arm ergomenter. Darren Wood has made good progress in his exercise despite orthopedic pain.  He is exercising at 2.3 mets on the nustep and level 3.5 on the arm ergomenter.  He is much less conditioned since participating in pulmonary rehab.      Expected Outcomes See admission goals. See admission goals.               Core Components/Risk Factors/Patient Goals at Discharge (Final Review):   Goals and Risk Factor Review - 11/19/20 1422       Core Components/Risk Factors/Patient Goals Review   Personal Goals Review Develop more efficient breathing techniques such as purse lipped breathing and diaphragmatic breathing and practicing self-pacing with activity.;Increase knowledge of respiratory medications and ability to use respiratory devices properly.;Improve shortness of breath with ADL's    Review Darren Wood has made good progress in  his exercise despite orthopedic pain.  He is exercising at 2.3 mets on the nustep and level 3.5 on the arm ergomenter.  He is much less conditioned since participating in pulmonary rehab.    Expected Outcomes See admission goals.             ITP Comments:   Comments:  Darren Wood has completed 10 exercise session in Pulmonary rehab. Pt maintains good attendance and consistent home exercise. Pulmonary rehab staff will continue to monitor and reassess progress toward goals during his participation in Pulmonary Rehab.

## 2020-11-20 NOTE — Progress Notes (Signed)
Daily Session Note  Patient Details  Name: Darren Wood MRN: [id number] Date of Birth: [birth year] Referring Provider:   April Manson Pulmonary Rehab Walk Test from 10/05/2020 in [place]  Referring Provider Dr. Camillo Flaming       Encounter Date: 11/20/2020  Check In:  Session Check In - 11/20/20 1505       Check-In   Supervising physician immediately available to respond to emergencies [facility] immediately available    Physician(s) Dr. Sabino Gasser    Location [facility]    Staff Present Rosebud Poles, RN, Isaac Laud, MS, ACSM-CEP, Exercise Physiologist;Claus Silvestro Ysidro Evert, RN    Virtual Visit No    Medication changes reported     No    Fall or balance concerns reported    No    Tobacco Cessation No Change    Warm-up and Cool-down Performed as group-led instruction    Resistance Training Performed Yes    VAD Patient? No    PAD/SET Patient? No      Pain Assessment   Currently in Pain? No/denies    Pain Score 0-No pain    Multiple Pain Sites No             Capillary Blood Glucose: No results found for this or any previous visit (from the past 24 hour(s)).    Social History   Tobacco Use  Smoking Status Never  Smokeless Tobacco Never    Goals Met:  Exercise tolerated well No report of cardiac concerns or symptoms Strength training completed today  Goals Unmet:  Not Applicable  Comments: Service time is from 1315 to 1435    Dr. Fransico Him is Medical Director for Cardiac Rehab at [facility].

## 2020-11-22 ENCOUNTER — Other Ambulatory Visit: Payer: Self-pay

## 2020-11-22 ENCOUNTER — Encounter (HOSPITAL_COMMUNITY)
Admission: RE | Admit: 2020-11-22 | Discharge: 2020-11-22 | Disposition: A | Discharge status: Home / Self Care | Payer: BC Managed Care – PPO | Source: Ambulatory Visit | Attending: Cardiology | Admitting: Cardiology

## 2020-11-22 VITALS — Wt 264.6 lb

## 2020-11-22 DIAGNOSIS — J470 Bronchiectasis with acute lower respiratory infection: Secondary | ICD-10-CM

## 2020-11-22 NOTE — Progress Notes (Signed)
Daily Session Note  Patient Details  Name: Darren Wood MRN: [id number] Date of Birth: November 17, 1960 Referring Provider:   April Manson Pulmonary Rehab Walk Test from 10/05/2020 in [place]  Referring Provider Dr. Camillo Flaming       Encounter Date: 11/22/2020  Check In:  Session Check In - 11/22/20 1410       Check-In   Supervising physician immediately available to respond to emergencies [facility] immediately available    Physician(s) Dr. Sabino Gasser    Location [facility]    Staff Present Rosebud Poles, RN, BSN;Graceyn Fodor Ysidro Evert, Cathleen Fears, MS, ACSM-CEP, Exercise Physiologist;Jessica Hassell Done, MS, ACSM-CEP, Exercise Physiologist    Virtual Visit No    Medication changes reported     No    Fall or balance concerns reported    No    Tobacco Cessation No Change    Warm-up and Cool-down Performed as group-led instruction    Resistance Training Performed Yes    VAD Patient? No    PAD/SET Patient? No      Pain Assessment   Currently in Pain? No/denies    Pain Score 0-No pain    Multiple Pain Sites No             Capillary Blood Glucose: No results found for this or any previous visit (from the past 24 hour(s)).    Social History   Tobacco Use  Smoking Status Never  Smokeless Tobacco Never    Goals Met:  Exercise tolerated well No report of cardiac concerns or symptoms Strength training completed today  Goals Unmet:  Not Applicable  Comments: Service time is from 1316 to 1435    Dr. Fransico Him is Medical Director for Cardiac Rehab at [facility].

## 2020-11-27 ENCOUNTER — Telehealth: Payer: Self-pay | Admitting: Internal Medicine

## 2020-11-27 ENCOUNTER — Telehealth (HOSPITAL_COMMUNITY): Payer: Self-pay | Admitting: Family Medicine

## 2020-11-27 ENCOUNTER — Encounter (HOSPITAL_COMMUNITY): Payer: BC Managed Care – PPO

## 2020-11-27 NOTE — Telephone Encounter (Signed)
Pts wife is calling in regards to disability paperwork. Pls regard; [phone].

## 2020-11-28 ENCOUNTER — Ambulatory Visit (INDEPENDENT_AMBULATORY_CARE_PROVIDER_SITE_OTHER)
Admission: RE | Admit: 2020-11-28 | Discharge: 2020-11-28 | Disposition: A | Discharge status: Home / Self Care | Payer: BC Managed Care – PPO | Source: Ambulatory Visit | Attending: Internal Medicine | Admitting: Internal Medicine

## 2020-11-28 ENCOUNTER — Other Ambulatory Visit: Payer: Self-pay

## 2020-11-28 DIAGNOSIS — R9389 Abnormal findings on diagnostic imaging of other specified body structures: Secondary | ICD-10-CM | POA: Diagnosis not present

## 2020-11-28 DIAGNOSIS — R0609 Other forms of dyspnea: Secondary | ICD-10-CM | POA: Diagnosis not present

## 2020-11-28 DIAGNOSIS — U099 Post covid-19 condition, unspecified: Secondary | ICD-10-CM

## 2020-11-29 ENCOUNTER — Encounter (HOSPITAL_COMMUNITY)
Admission: RE | Admit: 2020-11-29 | Discharge: 2020-11-29 | Disposition: A | Discharge status: Home / Self Care | Payer: BC Managed Care – PPO | Source: Ambulatory Visit | Attending: Cardiology | Admitting: Cardiology

## 2020-11-29 DIAGNOSIS — J470 Bronchiectasis with acute lower respiratory infection: Secondary | ICD-10-CM | POA: Diagnosis not present

## 2020-11-29 NOTE — Progress Notes (Signed)
Daily Session Note  Patient Details  Name: Darren Wood MRN: [id number] Date of Birth: 01-01-1961 Referring Provider:   April Manson Pulmonary Rehab Walk Test from 10/05/2020 in [place]  Referring Provider Dr. Camillo Flaming       Encounter Date: 11/29/2020  Check In:  Session Check In - 11/29/20 1431       Check-In   Supervising physician immediately available to respond to emergencies [facility] immediately available    Physician(s) Dr. Karleen Hampshire    Location [facility]    Staff Present Rosebud Poles, RN, Quentin Ore, MS, ACSM-CEP, Exercise Physiologist;Jessica Hassell Done, MS, ACSM-CEP, Exercise Physiologist;Lisa Ysidro Evert, RN    Virtual Visit No    Medication changes reported     No    Fall or balance concerns reported    No    Tobacco Cessation No Change    Warm-up and Cool-down Performed as group-led instruction    Resistance Training Performed Yes    VAD Patient? No    PAD/SET Patient? No      Pain Assessment   Currently in Pain? No/denies    Multiple Pain Sites No             Capillary Blood Glucose: No results found for this or any previous visit (from the past 24 hour(s)).    Social History   Tobacco Use  Smoking Status Never  Smokeless Tobacco Never    Goals Met:  Proper associated with RPD/PD & O2 Sat Exercise tolerated well No report of cardiac concerns or symptoms Strength training completed today  Goals Unmet:  Not Applicable  Comments: Service time is from 1318 to 2    Dr. Fransico Him is Medical Director for Cardiac Rehab at [facility].

## 2020-12-03 ENCOUNTER — Other Ambulatory Visit: Payer: Self-pay

## 2020-12-03 ENCOUNTER — Ambulatory Visit (HOSPITAL_COMMUNITY): Payer: BC Managed Care – PPO | Attending: Internal Medicine

## 2020-12-03 DIAGNOSIS — R0609 Other forms of dyspnea: Secondary | ICD-10-CM | POA: Insufficient documentation

## 2020-12-03 DIAGNOSIS — U099 Post covid-19 condition, unspecified: Secondary | ICD-10-CM | POA: Insufficient documentation

## 2020-12-03 LAB — ECHOCARDIOGRAM COMPLETE
Area-P 1/2: 3.05 cm2
S' Lateral: 2.8 cm

## 2020-12-04 ENCOUNTER — Other Ambulatory Visit: Payer: Self-pay

## 2020-12-04 ENCOUNTER — Encounter (HOSPITAL_COMMUNITY): Payer: BC Managed Care – PPO

## 2020-12-04 ENCOUNTER — Ambulatory Visit: Payer: BC Managed Care – PPO | Attending: Nurse Practitioner | Admitting: Nurse Practitioner

## 2020-12-04 ENCOUNTER — Encounter: Payer: Self-pay | Admitting: Nurse Practitioner

## 2020-12-04 ENCOUNTER — Ambulatory Visit: Payer: BC Managed Care – PPO

## 2020-12-04 ENCOUNTER — Telehealth: Payer: Self-pay | Admitting: Internal Medicine

## 2020-12-04 VITALS — BP 132/91 | HR 67 | Temp 97.9°F | Resp 18

## 2020-12-04 DIAGNOSIS — Z8616 Personal history of COVID-19: Secondary | ICD-10-CM | POA: Diagnosis not present

## 2020-12-04 NOTE — Telephone Encounter (Signed)
Returned called from Mrs. Chapel and let her know we have received the disability paperwork.  Current benefits are certified through 01/02/2021.  Patient is scheduled for PFT on 8/3.

## 2020-12-04 NOTE — Patient Instructions (Signed)
History of Covid 19 Chronic respiratory failure Physical deconditioning:     Stay well hydrated   Stay active   Deep breathing exercises   May take tylenol for fever or pain   Continue to follow with pulmonary  PFT scheduled tomorrow   Continue pulmonary rehab  Sleep study and follow up appointment already scheduled     Memory loss Brain fog:   Improving  Will place another referral to speech therapy for cognitive rehabilitation if symptoms worsen       Follow up:   Follow up if needed

## 2020-12-04 NOTE — Progress Notes (Signed)
@Patient  ID: , male    DOB: 03/23/1961, 60 y.o.   MRN: [id number]  Chief Complaint  Patient presents with   Follow-up     Referring provider: [id number], MD  60 year old male with history of hypertension, obesity class II, dyslipidemia and DJD.  He tested positive for COVID on 02/07/2020.  He was admitted between 02/13/2020 through 02/26/2020 with COVID-19 at the [facility] health system.  Recent Imaging:  HRCT 11/28/20: 1. Pulmonary parenchymal pattern of peripheral and basilar predominant coarsened ground-glass and bronchiectasis/bronchiolectasis, minimally improved from 05/16/2020 and in keeping with sequelae of COVID-19 pneumonia. 2. Hepatic steatosis. 3. Enlarged pulmonary arteries, indicative of pulmonary arterial hypertension.  HPI  Patient presents today for post-COVID care clinic visit follow-up.  Patient was last seen in our office in June.  Since that time he has followed with pulmonary rehab.  He has followed with Dr. July in pulmonary and recently had a follow-up HRCT.  CT scan did show improvement.  Patient is scheduled for PFT with pulmonary tomorrow.  Patient is scheduled for a sleep study and follow-up with Dr. Marchelle Gearing.  Overall patient states that he is much improved.  He states that he is still not 100% better but has significantly improved.  Patient was referred at his last visit with post-COVID to speech therapy for cognitive rehabilitation.  He canceled this appointment due to having numerous follow-up appointments at that time.  He does state that his memory and cognitive ability is improving.  We discussed that he can come back to the office if anything changes and we can place another referral for speech therapy for cognitive rehabilitation if needed.     No Known Allergies   There is no immunization history on file for this patient.  Past Medical History:  Diagnosis Date   Hypertension     Tobacco History: Social History   Tobacco Use   Smoking Status Never  Smokeless Tobacco Never   Counseling given: Yes   Outpatient Encounter Medications as of 12/04/2020  Medication Sig   acetaminophen (TYLENOL) 500 MG tablet Take 500 mg by mouth every 6 (six) hours as needed for fever.   atorvastatin (LIPITOR) 10 MG tablet Take 10 mg by mouth daily.   bismuth subsalicylate (PEPTO BISMOL) 262 MG/15ML suspension Take 30 mLs by mouth every 6 (six) hours as needed for indigestion.   losartan-hydrochlorothiazide (HYZAAR) 100-12.5 MG tablet Take 1 tablet by mouth daily.   No facility-administered encounter medications on file as of 12/04/2020.     Review of Systems  Review of Systems  Constitutional: Negative.   HENT: Negative.    Respiratory:  Positive for shortness of breath. Negative for cough.   Cardiovascular: Negative.   Gastrointestinal: Negative.   Allergic/Immunologic: Negative.   Neurological: Negative.   Psychiatric/Behavioral: Negative.        Physical Exam  BP (!) 132/91   Pulse 67   Temp 97.9 F (36.6 C)   Resp 18   SpO2 98%   Wt Readings from Last 5 Encounters:  11/22/20 264 lb 8.8 oz (120 kg)  11/13/20 266 lb 15.6 oz (121.1 kg)  11/02/20 263 lb 6.4 oz (119.5 kg)  10/30/20 261 lb 11 oz (118.7 kg)  10/16/20 266 lb 15.6 oz (121.1 kg)     Physical Exam Vitals and nursing note reviewed.  Constitutional:      General: He is not in acute distress.    Appearance: He is well-developed.  Cardiovascular:     Rate  and Rhythm: Normal rate and regular rhythm.  Pulmonary:     Effort: Pulmonary effort is normal.     Breath sounds: Normal breath sounds.  Skin:    General: Skin is warm and dry.  Neurological:     Mental Status: He is alert and oriented to person, place, and time.      Imaging: CT Chest High Resolution  Result Date: 11/29/2020 CLINICAL DATA:  Shortness of breath on exertion. Post COVID syndrome. EXAM: CT CHEST WITHOUT CONTRAST TECHNIQUE: Multidetector CT imaging of the chest was  performed following the standard protocol without intravenous contrast. High resolution imaging of the lungs, as well as inspiratory and expiratory imaging, was performed. COMPARISON:  05/16/2020. FINDINGS: Cardiovascular: Enlarged pulmonary arteries and heart. No pericardial effusion. Mediastinum/Nodes: No pathologically enlarged mediastinal or axillary lymph nodes. Hilar regions are difficult to definitively evaluate without IV contrast. Esophagus is grossly unremarkable. Lungs/Pleura: Peripheral and basilar coarsened ground-glass with bronchiectasis/bronchiolectasis. Findings appear minimally improved from 05/16/2020. No subpleural reticulation or honeycombing. Scattered small subpleural lymph nodes along the fissures. No pleural fluid. Airway is unremarkable. No air trapping. Upper Abdomen: Liver is decreased in attenuation diffusely. Visualized portions of the liver, gallbladder, adrenal glands, kidneys, spleen, pancreas, stomach and bowel are unremarkable with exception of a small hiatal hernia. No upper abdominal adenopathy. Musculoskeletal: Degenerative changes in the spine. No worrisome lytic or sclerotic lesions. IMPRESSION: 1. Pulmonary parenchymal pattern of peripheral and basilar predominant coarsened ground-glass and bronchiectasis/bronchiolectasis, minimally improved from 05/16/2020 and in keeping with sequelae of COVID-19 pneumonia. 2. Hepatic steatosis. 3. Enlarged pulmonary arteries, indicative of pulmonary arterial hypertension. Electronically Signed   By: Leanna BattlesMelinda  Blietz M.D.   On: 11/29/2020 11:20   ECHOCARDIOGRAM COMPLETE  Result Date: 12/03/2020    ECHOCARDIOGRAM REPORT   Patient Name:   Darren Wood  Date of Exam: 12/03/2020 Medical Rec #:  [id number]     Height:       72.0 in Accession #:    [id number]    Weight:       264.6 lb Date of Birth:  02/24/1961      BSA:          2.400 m Patient Age:    60 years      BP:           124/84 mmHg Patient Gender: M             HR:           89 bpm. 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  Sonographer:    Samule OhmWilliam Edwards RDCS Referring Phys: [phone] [id number] [facility] LtdMURALI RAMASWAMY  Sonographer Comments: Technically difficult study due to poor echo windows, Technically challenging study due to limited acoustic windows and patient is morbidly obese. Image acquisition challenging due to patient body habitus. IMPRESSIONS  1. Left ventricular ejection fraction, by estimation, is 60 to 65%. The left ventricle has normal function. The left ventricle has no regional wall motion abnormalities- though technically difficult study. There is moderate concentric left ventricular hypertrophy. Left ventricular diastolic parameters are consistent with Grade I diastolic dysfunction (  impaired relaxation).  2. Right ventricular systolic function is normal. The right ventricular size is normal. Tricuspid regurgitation signal is inadequate for assessing PA pressure.  3. Left atrial size was mildly dilated.  4. The mitral valve is normal in structure. No evidence of mitral valve regurgitation. No evidence of mitral stenosis.  5. The aortic valve is tricuspid. Aortic valve regurgitation is not visualized. No aortic stenosis is present.  6. Aortic dilatation noted. There is mild dilatation of the ascending aorta, measuring 40 mm. Comparison(s): No prior Echocardiogram. Consider use of echo-contrast in future studies. FINDINGS  Left Ventricle: Left ventricular ejection fraction, by estimation, is 60 to 65%. The left ventricle has normal function. The left ventricle has no regional wall motion abnormalities. The left ventricular internal cavity size was normal in size. There is  moderate concentric left ventricular hypertrophy. Left  ventricular diastolic parameters are consistent with Grade I diastolic dysfunction (impaired relaxation). Right Ventricle: The right ventricular size is normal. Right vetricular wall thickness was not well visualized. Right ventricular systolic function is normal. Tricuspid regurgitation signal is inadequate for assessing PA pressure. Left Atrium: Left atrial size was mildly dilated. Right Atrium: Right atrial size was normal in size. Pericardium: There is no evidence of pericardial effusion. Mitral Valve: The mitral valve is normal in structure. No evidence of mitral valve regurgitation. No evidence of mitral valve stenosis. Tricuspid Valve: The tricuspid valve is grossly normal. Tricuspid valve regurgitation is trivial. No evidence of tricuspid stenosis. Aortic Valve: The aortic valve is tricuspid. Aortic valve regurgitation is not visualized. No aortic stenosis is present. Pulmonic Valve: The pulmonic valve was not well visualized. Pulmonic valve regurgitation is not visualized. No evidence of pulmonic stenosis. Aorta: Aortic dilatation noted. There is mild dilatation of the ascending aorta, measuring 40 mm. Venous: The inferior vena cava was not well visualized. IAS/Shunts: The atrial septum is grossly normal.  LEFT VENTRICLE PLAX 2D LVIDd:         4.60 cm  Diastology LVIDs:         2.80 cm  LV e' medial:    3.70 cm/s LV PW:         1.40 cm  LV E/e' medial:  17.2 LV IVS:        1.40 cm  LV e' lateral:   8.05 cm/s LVOT diam:     2.60 cm  LV E/e' lateral: 7.9 LV SV:         73 LV SV Index:   31 LVOT Area:     5.31 cm  LEFT ATRIUM           Index       RIGHT ATRIUM LA diam:      4.90 cm 2.04 cm/m  RA Pressure: 3.00 mmHg LA Vol (A4C): 83.5 ml 34.80 ml/m  AORTIC VALVE LVOT Vmax:   84.70 cm/s LVOT Vmean:  51.900 cm/s LVOT VTI:    0.138 m  AORTA Ao Root diam: 3.60 cm Ao Asc diam:  4.00 cm MITRAL VALVE                TRICUSPID VALVE MV Area (PHT): 3.05 cm     Estimated RAP:  3.00 mmHg MV Decel Time: 249 msec MV E  velocity: 63.70 cm/s   SHUNTS MV A velocity: 102.00 cm/s  Systemic VTI:  0.14 m MV E/A ratio:  0.62         Systemic Diam: 2.60 cm Riley Lam MD Electronically signed by Riley Lam MD Signature  Date/Time: 12/03/2020/3:17:21 PM    Final      Assessment & Plan:   History of COVID-19 Chronic respiratory failure Physical deconditioning:     Stay well hydrated   Stay active   Deep breathing exercises   May take tylenol for fever or pain   Continue to follow with pulmonary  PFT scheduled tomorrow   Continue pulmonary rehab  Sleep study and follow up appointment already scheduled     Memory loss Brain fog:   Improving  Will place another referral to speech therapy for cognitive rehabilitation if symptoms worsen       Follow up:   Follow up if needed       Ivonne Andrew, NP 12/04/2020

## 2020-12-04 NOTE — Assessment & Plan Note (Signed)
Chronic respiratory failure Physical deconditioning:     Stay well hydrated   Stay active   Deep breathing exercises   May take tylenol for fever or pain   Continue to follow with pulmonary  PFT scheduled tomorrow   Continue pulmonary rehab  Sleep study and follow up appointment already scheduled     Memory loss Brain fog:   Improving  Will place another referral to speech therapy for cognitive rehabilitation if symptoms worsen       Follow up:   Follow up if needed

## 2020-12-05 ENCOUNTER — Ambulatory Visit: Payer: BC Managed Care – PPO | Admitting: Primary Care

## 2020-12-05 ENCOUNTER — Ambulatory Visit: Payer: BC Managed Care – PPO

## 2020-12-05 DIAGNOSIS — Z8616 Personal history of COVID-19: Secondary | ICD-10-CM

## 2020-12-05 DIAGNOSIS — R0681 Apnea, not elsewhere classified: Secondary | ICD-10-CM

## 2020-12-05 DIAGNOSIS — R5381 Other malaise: Secondary | ICD-10-CM

## 2020-12-05 LAB — PULMONARY FUNCTION TEST
DL/VA % pred: 93 %
DL/VA: 3.89 ml/min/mmHg/L
DLCO cor % pred: 56 %
DLCO cor: 16.02 ml/min/mmHg
DLCO unc % pred: 55 %
DLCO unc: 15.7 ml/min/mmHg
FEF 25-75 Post: 4.83 L/sec
FEF 25-75 Pre: 4.63 L/sec
FEF2575-%Change-Post: 4 %
FEF2575-%Pred-Post: 165 %
FEF2575-%Pred-Pre: 158 %
FEV1-%Change-Post: 0 %
FEV1-%Pred-Post: 84 %
FEV1-%Pred-Pre: 84 %
FEV1-Post: 2.69 L
FEV1-Pre: 2.71 L
FEV1FVC-%Change-Post: 0 %
FEV1FVC-%Pred-Pre: 115 %
FEV6-%Change-Post: 0 %
FEV6-%Pred-Post: 75 %
FEV6-%Pred-Pre: 75 %
FEV6-Post: 2.99 L
FEV6-Pre: 3 L
FEV6FVC-%Change-Post: 0 %
FEV6FVC-%Pred-Post: 103 %
FEV6FVC-%Pred-Pre: 103 %
FVC-%Change-Post: 0 %
FVC-%Pred-Post: 72 %
FVC-%Pred-Pre: 72 %
FVC-Post: 2.99 L
FVC-Pre: 3.01 L
Post FEV1/FVC ratio: 90 %
Post FEV6/FVC ratio: 100 %
Pre FEV1/FVC ratio: 90 %
Pre FEV6/FVC Ratio: 100 %

## 2020-12-06 ENCOUNTER — Encounter (HOSPITAL_COMMUNITY): Payer: BC Managed Care – PPO

## 2020-12-06 ENCOUNTER — Telehealth (HOSPITAL_COMMUNITY): Payer: Self-pay | Admitting: Family Medicine

## 2020-12-10 NOTE — Telephone Encounter (Signed)
Patient's wife, Diedre, called to say she got a letter saying disability paperwork for extension needs to be submitted by 8/15.  I let her know we have the form and our policy is that it can take up to 2 weeks to get it filled out, signed and submitted back to insurance.  I reminded her Mr. Wrede has a sleep appt on 8/18.  Sent reminder email to Yaak.

## 2020-12-11 ENCOUNTER — Other Ambulatory Visit: Payer: Self-pay

## 2020-12-11 ENCOUNTER — Telehealth: Payer: Self-pay | Admitting: Internal Medicine

## 2020-12-11 ENCOUNTER — Encounter (HOSPITAL_COMMUNITY)
Admission: RE | Admit: 2020-12-11 | Discharge: 2020-12-11 | Disposition: A | Discharge status: Home / Self Care | Payer: BC Managed Care – PPO | Source: Ambulatory Visit | Attending: Internal Medicine | Admitting: Internal Medicine

## 2020-12-11 DIAGNOSIS — J470 Bronchiectasis with acute lower respiratory infection: Secondary | ICD-10-CM | POA: Insufficient documentation

## 2020-12-11 NOTE — Progress Notes (Signed)
Daily Session Note  Patient Details  Name: Darren Wood MRN: [id number] Date of Birth: [birth year] Referring Provider:   April Manson Pulmonary Rehab Walk Test from 10/05/2020 in [place]  Referring Provider Dr. Camillo Flaming       Encounter Date: 12/11/2020  Check In:  Session Check In - 12/11/20 1503       Check-In   Supervising physician immediately available to respond to emergencies [facility] immediately available    Physician(s) Dr. Broadus John    Location [facility]    Staff Present Rosebud Poles, RN, BSN;Carlette Wilber Oliphant, RN, Isaac Laud, MS, ACSM-CEP, Exercise Physiologist;Lisa Ysidro Evert, RN    Virtual Visit No    Medication changes reported     No    Fall or balance concerns reported    No    Tobacco Cessation No Change    Warm-up and Cool-down Performed as group-led instruction    Resistance Training Performed Yes    VAD Patient? No    PAD/SET Patient? No      Pain Assessment   Currently in Pain? No/denies             Capillary Blood Glucose: No results found for this or any previous visit (from the past 24 hour(s)).   Exercise Prescription Changes - 12/11/20 1500       Response to Exercise   Blood Pressure (Admit) 134/78    Blood Pressure (Exercise) 130/86    Blood Pressure (Exit) 110/80    Heart Rate (Admit) 106 bpm    Heart Rate (Exercise) 121 bpm    Heart Rate (Exit) 112 bpm    Oxygen Saturation (Admit) 99 %    Oxygen Saturation (Exercise) 96 %    Oxygen Saturation (Exit) 98 %    Rating of Perceived Exertion (Exercise) 11    Perceived Dyspnea (Exercise) 0    Duration Continue with 30 min of aerobic exercise without signs/symptoms of physical distress.    Intensity THRR unchanged      Progression   Progression Continue to progress workloads to maintain intensity without signs/symptoms of physical distress.      Resistance Training   Training Prescription Yes    Weight blue bands    Reps 10-15     Time 10 Minutes      Oxygen   Oxygen Continuous    Liters 1   is requiring 1 L of supplemental oxygen when performing seated exercise.     NuStep   Level 5    SPM 80    Minutes 15    METs 2.3      Arm Ergometer   Level 4    Watts 42    Minutes 15             Social History   Tobacco Use  Smoking Status Never  Smokeless Tobacco Never    Goals Met:  Proper associated with RPD/PD & O2 Sat Exercise tolerated well No report of cardiac concerns or symptoms Strength training completed today  Goals Unmet:  Not Applicable  Comments: Service time is from 1324 to 1445.    Dr. Fransico Him is Medical Director for Cardiac Rehab at [facility]

## 2020-12-11 NOTE — Telephone Encounter (Signed)
Rec'd disability form - patient is approved through 01/02/2021 - at that time they need the form completed and sent with ov notes of recent visits. Patient has a sleep consult with Dr. Maple Hudson on 12/20/2020 - I will hold off on completing from until after patient sees Dr. Maple Hudson to see if patient needs to continue to be out of work after 01/02/2021. -Will ask Darilyn to resend form to me  after 12/20/2020 for completion-pr

## 2020-12-13 ENCOUNTER — Encounter (HOSPITAL_COMMUNITY)
Admission: RE | Admit: 2020-12-13 | Discharge: 2020-12-13 | Disposition: A | Discharge status: Home / Self Care | Payer: BC Managed Care – PPO | Source: Ambulatory Visit | Attending: Internal Medicine | Admitting: Internal Medicine

## 2020-12-13 ENCOUNTER — Other Ambulatory Visit: Payer: Self-pay

## 2020-12-13 DIAGNOSIS — J470 Bronchiectasis with acute lower respiratory infection: Secondary | ICD-10-CM | POA: Diagnosis not present

## 2020-12-13 NOTE — Progress Notes (Signed)
Daily Session Note  Patient Details  Name: Darren Wood MRN: [id number] Date of Birth: 03-03-1961 Referring Provider:   April Manson Pulmonary Rehab Walk Test from 10/05/2020 in [place]  Referring Provider Dr. Camillo Flaming       Encounter Date: 12/13/2020  Check In:  Session Check In - 12/13/20 1430       Check-In   Supervising physician immediately available to respond to emergencies [facility] immediately available    Physician(s) Dr. Alfredia Ferguson    Location [facility]    Staff Present Rosebud Poles, RN, BSN;Jetta Walker BS, ACSM EP-C, Exercise Physiologist;Sibbie Flammia Clarisa Schools, MS, ACSM-CEP, Exercise Physiologist    Virtual Visit No    Medication changes reported     No    Fall or balance concerns reported    No    Tobacco Cessation No Change    Warm-up and Cool-down Performed as group-led instruction    Resistance Training Performed Yes    VAD Patient? No    PAD/SET Patient? No      Pain Assessment   Pain Score 0-No pain    Multiple Pain Sites No             Capillary Blood Glucose: No results found for this or any previous visit (from the past 24 hour(s)).    Social History   Tobacco Use  Smoking Status Never  Smokeless Tobacco Never    Goals Met:  Exercise tolerated well No report of cardiac concerns or symptoms Strength training completed today  Goals Unmet:  Not Applicable  Comments: Service time is from 1322 to 1440    Dr. Fransico Him is Medical Director for Cardiac Rehab at [facility].

## 2020-12-18 ENCOUNTER — Other Ambulatory Visit: Payer: Self-pay

## 2020-12-18 ENCOUNTER — Encounter (HOSPITAL_COMMUNITY)
Admission: RE | Admit: 2020-12-18 | Discharge: 2020-12-18 | Disposition: A | Discharge status: Home / Self Care | Payer: BC Managed Care – PPO | Source: Ambulatory Visit | Attending: Internal Medicine | Admitting: Internal Medicine

## 2020-12-18 DIAGNOSIS — J470 Bronchiectasis with acute lower respiratory infection: Secondary | ICD-10-CM | POA: Diagnosis not present

## 2020-12-18 NOTE — Progress Notes (Signed)
Pulmonary Individual Treatment Plan  Patient Details  Name: Darren Wood MRN: [id number] Date of Birth: 1960/08/06 Referring Provider:   April Manson Pulmonary Rehab Walk Test from 10/05/2020 in [place]  Referring Provider Dr. Camillo Flaming       Initial Encounter Date:  Flowsheet Row Pulmonary Rehab Walk Test from 10/05/2020 in [place]  Date 10/05/20       Visit Diagnosis: Bronchiectasis with acute lower respiratory infection ([place])  Patient's Home Medications on Admission:   Current Outpatient Medications:    acetaminophen (TYLENOL) 500 MG tablet, Take 500 mg by mouth every 6 (six) hours as needed for fever., Disp: , Rfl:    atorvastatin (LIPITOR) 10 MG tablet, Take 10 mg by mouth daily., Disp: , Rfl:    bismuth subsalicylate (PEPTO BISMOL) 262 MG/15ML suspension, Take 30 mLs by mouth every 6 (six) hours as needed for indigestion., Disp: , Rfl:    losartan-hydrochlorothiazide (HYZAAR) 100-12.5 MG tablet, Take 1 tablet by mouth daily., Disp: , Rfl:   Past Medical History: Past Medical History:  Diagnosis Date   Hypertension     Tobacco Use: Social History   Tobacco Use  Smoking Status Never  Smokeless Tobacco Never    Labs: Recent Review Flowsheet Data     Labs for ITP Cardiac and Pulmonary Rehab Latest Ref Rng & Units 02/13/2020   Trlycerides <150 mg/dL 151(H)       Capillary Blood Glucose: No results found for: GLUCAP   Pulmonary Assessment Scores:  Pulmonary Assessment Scores     Row Name 10/05/20 1530         ADL UCSD   ADL Phase Entry     SOB Score total 46           CAT Score   CAT Score 17           mMRC Score   mMRC Score 4             UCSD: Self-administered rating of dyspnea associated with activities of daily living (ADLs) 6-point scale (0 = "not at all" to 5 = "maximal or unable to do because of breathlessness")  Scoring Scores range from 0 to 120.  Minimally  important difference is 5 units  CAT: CAT can identify the health impairment of COPD patients and is better correlated with disease progression.  CAT has a scoring range of zero to 40. The CAT score is classified into four groups of low (less than 10), medium (10 - 20), high (21-30) and very high (31-40) based on the impact level of disease on health status. A CAT score over 10 suggests significant symptoms.  A worsening CAT score could be explained by an exacerbation, poor medication adherence, poor inhaler technique, or progression of COPD or comorbid conditions.  CAT MCID is 2 points  mMRC: mMRC (Modified Medical Research Council) Dyspnea Scale is used to assess the degree of baseline functional disability in patients of respiratory disease due to dyspnea. No minimal important difference is established. A decrease in score of 1 point or greater is considered a positive change.   Pulmonary Function Assessment:  Pulmonary Function Assessment - 10/05/20 1408       Breath   Shortness of Breath Yes;Limiting activity             Exercise Target Goals: Exercise Program Goal: Individual exercise prescription set using results from initial 6 min walk test and THRR while considering  patient's activity barriers  and safety.   Exercise Prescription Goal: Initial exercise prescription builds to 30-45 minutes a Wood of aerobic activity, 2-3 days per week.  Home exercise guidelines will be given to patient during program as part of exercise prescription that the participant will acknowledge.  Activity Barriers & Risk Stratification:  Activity Barriers & Cardiac Risk Stratification - 10/05/20 1402       Activity Barriers & Cardiac Risk Stratification   Activity Barriers Left Hip Replacement;Shortness of Breath;Arthritis             6 Minute Walk:  6 Minute Walk     Row Name 10/05/20 1518         6 Minute Walk   Phase Initial     Distance 800 feet     Walk Time 6 minutes     #  of Rest Breaks 0     MPH 1.52     METS 2.29     RPE 12     Perceived Dyspnea  1     VO2 Peak 8.03     Symptoms Yes (comment)     Comments Hip and knee pain 5/10-chronic     Resting HR 91 bpm     Resting BP 124/86     Resting Oxygen Saturation  98 %     Exercise Oxygen Saturation  during 6 min walk 92 %     Max Ex. HR 132 bpm     Max Ex. BP 144/80     2 Minute Post BP 132/82           Interval HR   1 Minute HR 123     2 Minute HR 124     3 Minute HR 125     4 Minute HR 130     5 Minute HR 130     6 Minute HR 132     2 Minute Post HR 109     Interval Heart Rate? Yes           Interval Oxygen   Interval Oxygen? Yes     Baseline Oxygen Saturation % 98 %     1 Minute Oxygen Saturation % 93 %     1 Minute Liters of Oxygen 2 L     2 Minute Oxygen Saturation % 96 %     2 Minute Liters of Oxygen 2 L     3 Minute Oxygen Saturation % 96 %     3 Minute Liters of Oxygen 2 L     4 Minute Oxygen Saturation % 94 %     4 Minute Liters of Oxygen 2 L     5 Minute Oxygen Saturation % 95 %     5 Minute Liters of Oxygen 2 L     6 Minute Oxygen Saturation % 92 %     6 Minute Liters of Oxygen 2 L     2 Minute Post Oxygen Saturation % 99 %     2 Minute Post Liters of Oxygen 2 L              Oxygen Initial Assessment:  Oxygen Initial Assessment - 10/05/20 1406       Home Oxygen   Home Oxygen Device Portable Concentrator;Home Concentrator    Sleep Oxygen Prescription None    Home Exercise Oxygen Prescription Pulsed    Liters per minute 3    Home Resting Oxygen Prescription None    Compliance with Home Oxygen Use Yes  Initial 6 min Walk   Oxygen Used Continuous    Liters per minute 2      Program Oxygen Prescription   Program Oxygen Prescription Continuous    Liters per minute 2      Intervention   Short Term Goals To learn and exhibit compliance with exercise, home and travel O2 prescription;To learn and understand importance of monitoring SPO2 with pulse oximeter  and demonstrate accurate use of the pulse oximeter.;To learn and understand importance of maintaining oxygen saturations>88%;To learn and demonstrate proper pursed lip breathing techniques or other breathing techniques. ;To learn and demonstrate proper use of respiratory medications    Long  Term Goals Exhibits compliance with exercise, home  and travel O2 prescription;Verbalizes importance of monitoring SPO2 with pulse oximeter and return demonstration;Maintenance of O2 saturations>88%;Exhibits proper breathing techniques, such as pursed lip breathing or other method taught during program session;Compliance with respiratory medication;Demonstrates proper use of MDI's             Oxygen Re-Evaluation:  Oxygen Re-Evaluation     Row Name 10/22/20 1331 11/19/20 1521 12/18/20 0848         Program Oxygen Prescription   Program Oxygen Prescription Continuous Continuous Continuous     Liters per minute $RemoveBe'2 2 1           'pAqRytUaV$ Home Oxygen   Home Oxygen Device Portable Concentrator;Home Concentrator Portable Concentrator;Home Concentrator Portable Concentrator;Home Concentrator     Sleep Oxygen Prescription None None Continuous     Liters per minute -- -- 2     Home Exercise Oxygen Prescription Pulsed Pulsed Pulsed     Liters per minute $RemoveBe'3 3 2     'atxpNvNme$ Home Resting Oxygen Prescription None None Pulsed     Liters per minute -- -- 2     Compliance with Home Oxygen Use Yes Yes Yes           Goals/Expected Outcomes   Short Term Goals To learn and exhibit compliance with exercise, home and travel O2 prescription;To learn and understand importance of monitoring SPO2 with pulse oximeter and demonstrate accurate use of the pulse oximeter.;To learn and understand importance of maintaining oxygen saturations>88%;To learn and demonstrate proper pursed lip breathing techniques or other breathing techniques. ;To learn and demonstrate proper use of respiratory medications To learn and exhibit compliance with exercise,  home and travel O2 prescription;To learn and understand importance of monitoring SPO2 with pulse oximeter and demonstrate accurate use of the pulse oximeter.;To learn and understand importance of maintaining oxygen saturations>88%;To learn and demonstrate proper pursed lip breathing techniques or other breathing techniques. ;To learn and demonstrate proper use of respiratory medications --     Long  Term Goals Exhibits compliance with exercise, home  and travel O2 prescription;Verbalizes importance of monitoring SPO2 with pulse oximeter and return demonstration;Maintenance of O2 saturations>88%;Exhibits proper breathing techniques, such as pursed lip breathing or other method taught during program session;Compliance with respiratory medication;Demonstrates proper use of MDI's Exhibits compliance with exercise, home  and travel O2 prescription;Verbalizes importance of monitoring SPO2 with pulse oximeter and return demonstration;Maintenance of O2 saturations>88%;Exhibits proper breathing techniques, such as pursed lip breathing or other method taught during program session;Compliance with respiratory medication;Demonstrates proper use of MDI's --     Comments -- -- Has been decreased to 1 L of continuous oxygen when exercising in pulmonary rehab.     Goals/Expected Outcomes compliance and understanding of monitoring oxygen saturation and performing pursed lip breathing when needed. compliance and understanding of monitoring  oxygen saturation and performing pursed lip breathing when needed. --              Oxygen Discharge (Final Oxygen Re-Evaluation):  Oxygen Re-Evaluation - 12/18/20 0848       Program Oxygen Prescription   Program Oxygen Prescription Continuous    Liters per minute 1      Home Oxygen   Home Oxygen Device Portable Concentrator;Home Concentrator    Sleep Oxygen Prescription Continuous    Liters per minute 2    Home Exercise Oxygen Prescription Pulsed    Liters per minute 2     Home Resting Oxygen Prescription Pulsed    Liters per minute 2    Compliance with Home Oxygen Use Yes      Goals/Expected Outcomes   Comments Has been decreased to 1 L of continuous oxygen when exercising in pulmonary rehab.             Initial Exercise Prescription:  Initial Exercise Prescription - 10/05/20 1500       Date of Initial Exercise RX and Referring Provider   Date 10/05/20    Referring Provider Dr. Camillo Flaming    Expected Discharge Date 12/06/20      Oxygen   Oxygen Continuous    Liters 2      NuStep   Level 1    SPM 80    Minutes 15      Arm Ergometer   Level 1    Minutes 15      Intensity   THRR 40-80% of Max Heartrate 63-126    Ratings of Perceived Exertion 11-13    Perceived Dyspnea 0-4      Progression   Progression Continue to progress workloads to maintain intensity without signs/symptoms of physical distress.      Resistance Training   Training Prescription Yes    Weight Blue bands    Reps 10-15             Perform Capillary Blood Glucose checks as needed.  Exercise Prescription Changes:   Exercise Prescription Changes     Row Name 10/16/20 1400 10/23/20 1500 10/30/20 1500 11/13/20 1500 11/22/20 1523     Response to Exercise   Blood Pressure (Admit) 140/80 -- 124/84 132/86 126/78   Blood Pressure (Exercise) 154/82 -- 138/72 150/88 --   Blood Pressure (Exit) 126/84 -- 90/60  BP after exercise 90/50 gave pt water to drink, slowly improvered 106/71. 124/74 134/88   Heart Rate (Admit) 94 bpm -- 113 bpm 94 bpm 92 bpm   Heart Rate (Exercise) 111 bpm -- 120 bpm 115 bpm 112 bpm   Heart Rate (Exit) 105 bpm -- 112 bpm 104 bpm 104 bpm   Oxygen Saturation (Admit) 99 % -- 98 % 99 % 99 %   Oxygen Saturation (Exercise) 111 % -- 95 % 94 % 97 %   Oxygen Saturation (Exit) 105 % -- 98 % 99 % 97 %   Rating of Perceived Exertion (Exercise) 11 -- $Rem'11 12 11   'aRyB$ Perceived Dyspnea (Exercise) 0 -- 1 0 0   Duration Continue with 30 min of aerobic exercise  without signs/symptoms of physical distress. -- Continue with 30 min of aerobic exercise without signs/symptoms of physical distress. Continue with 30 min of aerobic exercise without signs/symptoms of physical distress. Continue with 30 min of aerobic exercise without signs/symptoms of physical distress.   Intensity THRR unchanged -- THRR unchanged THRR unchanged THRR unchanged     Progression   Progression Continue  to progress workloads to maintain intensity without signs/symptoms of physical distress. -- Continue to progress workloads to maintain intensity without signs/symptoms of physical distress. Continue to progress workloads to maintain intensity without signs/symptoms of physical distress. Continue to progress workloads to maintain intensity without signs/symptoms of physical distress.   Average METs 1.8 -- -- -- --     Horticulturist, commercial Prescription Yes -- Yes Yes Yes   Weight Blue bands -- blue bands blue bands blue bands   Reps 10-15 -- 10-15 10-15 10-15   Time 10 Minutes -- 10 Minutes 10 Minutes 10 Minutes     Oxygen   Oxygen Continuous -- Continuous Continuous Continuous   Liters 2 -- -- 2L 2     NuStep   Level 3 -- $Re'4 5 5   'wyk$ SPM 80 -- 80 80 80   Minutes 30 -- 150 15 15   METs 1.8 -- 2 2.4 3     Arm Ergometer   Level -- -- 4 3.5 3.5   Watts -- -- 43 41 43   Minutes -- -- $Rem'15 15 15     'kwEq$ Home Exercise Plan   Plans to continue exercise at -- Home (comment)  Walking, resistance training, and stretches -- -- --   Frequency -- Add 2 additional days to program exercise sessions. -- -- --   Initial Home Exercises Provided -- 10/23/20 -- -- --    [place] Name 12/11/20 1500             Response to Exercise   Blood Pressure (Admit) 134/78       Blood Pressure (Exercise) 130/86       Blood Pressure (Exit) 110/80       Heart Rate (Admit) 106 bpm       Heart Rate (Exercise) 121 bpm       Heart Rate (Exit) 112 bpm       Oxygen Saturation (Admit) 99 %       Oxygen  Saturation (Exercise) 96 %       Oxygen Saturation (Exit) 98 %       Rating of Perceived Exertion (Exercise) 11       Perceived Dyspnea (Exercise) 0       Duration Continue with 30 min of aerobic exercise without signs/symptoms of physical distress.       Intensity THRR unchanged               Progression   Progression Continue to progress workloads to maintain intensity without signs/symptoms of physical distress.               Resistance Training   Training Prescription Yes       Weight blue bands       Reps 10-15       Time 10 Minutes               Oxygen   Oxygen Continuous       Liters 1  is requiring 1 L of supplemental oxygen when performing seated exercise.               NuStep   Level 5       SPM 80       Minutes 15       METs 2.3               Arm Ergometer   Level 4       Watts 42  Minutes 15                Exercise Comments:   Exercise Comments     Row Name 10/09/20 1452 10/23/20 1524         Exercise Comments Patient completed first Wood of exercise and tolerated well with no complaints or concerns. He does have chronic hip and knee pain but stated that it was not agravated any worse than normal during exercise. He was able to do 15 minutes on the Nustep and 15 minutes on the arm ergometer with no rest breaks. Will continue to monitor. Completed home exercise with patient. Pt currently does resistance training and does physical therapy exercise 2 days a week but not every week. We discussed aiming to do some type of exercise at least 2 other days outside of rehab. We discussed adding walking to his exercise prescription. Pt was receptive and stated that he would start this week.               Exercise Goals and Review:   Exercise Goals     Row Name 10/05/20 1515 12/18/20 1000           Exercise Goals   Increase Physical Activity Yes Yes      Intervention Provide advice, education, support and counseling about physical  activity/exercise needs.;Develop an individualized exercise prescription for aerobic and resistive training based on initial evaluation findings, risk stratification, comorbidities and participant's personal goals. Provide advice, education, support and counseling about physical activity/exercise needs.;Develop an individualized exercise prescription for aerobic and resistive training based on initial evaluation findings, risk stratification, comorbidities and participant's personal goals.      Expected Outcomes Short Term: Attend rehab on a regular basis to increase amount of physical activity.;Long Term: Add in home exercise to make exercise part of routine and to increase amount of physical activity.;Long Term: Exercising regularly at least 3-5 days a week. Short Term: Attend rehab on a regular basis to increase amount of physical activity.;Long Term: Add in home exercise to make exercise part of routine and to increase amount of physical activity.;Long Term: Exercising regularly at least 3-5 days a week.      Increase Strength and Stamina Yes Yes      Intervention Provide advice, education, support and counseling about physical activity/exercise needs.;Develop an individualized exercise prescription for aerobic and resistive training based on initial evaluation findings, risk stratification, comorbidities and participant's personal goals. Provide advice, education, support and counseling about physical activity/exercise needs.;Develop an individualized exercise prescription for aerobic and resistive training based on initial evaluation findings, risk stratification, comorbidities and participant's personal goals.      Expected Outcomes Short Term: Increase workloads from initial exercise prescription for resistance, speed, and METs.;Short Term: Perform resistance training exercises routinely during rehab and add in resistance training at home;Long Term: Improve cardiorespiratory fitness, muscular endurance  and strength as measured by increased METs and functional capacity (6MWT) Short Term: Increase workloads from initial exercise prescription for resistance, speed, and METs.;Short Term: Perform resistance training exercises routinely during rehab and add in resistance training at home;Long Term: Improve cardiorespiratory fitness, muscular endurance and strength as measured by increased METs and functional capacity (6MWT)      Able to understand and use rate of perceived exertion (RPE) scale Yes Yes      Intervention Provide education and explanation on how to use RPE scale Provide education and explanation on how to use RPE scale      Expected Outcomes Short  Term: Able to use RPE daily in rehab to express subjective intensity level;Long Term:  Able to use RPE to guide intensity level when exercising independently Short Term: Able to use RPE daily in rehab to express subjective intensity level;Long Term:  Able to use RPE to guide intensity level when exercising independently      Able to understand and use Dyspnea scale Yes Yes      Intervention Provide education and explanation on how to use Dyspnea scale Provide education and explanation on how to use Dyspnea scale      Expected Outcomes Short Term: Able to use Dyspnea scale daily in rehab to express subjective sense of shortness of breath during exertion;Long Term: Able to use Dyspnea scale to guide intensity level when exercising independently Short Term: Able to use Dyspnea scale daily in rehab to express subjective sense of shortness of breath during exertion;Long Term: Able to use Dyspnea scale to guide intensity level when exercising independently      Knowledge and understanding of Target Heart Rate Range (THRR) Yes Yes      Intervention Provide education and explanation of THRR including how the numbers were predicted and where they are located for reference Provide education and explanation of THRR including how the numbers were predicted and where  they are located for reference      Expected Outcomes Short Term: Able to state/look up THRR;Long Term: Able to use THRR to govern intensity when exercising independently;Short Term: Able to use daily as guideline for intensity in rehab Short Term: Able to state/look up THRR;Long Term: Able to use THRR to govern intensity when exercising independently;Short Term: Able to use daily as guideline for intensity in rehab      Understanding of Exercise Prescription Yes Yes      Intervention Provide education, explanation, and written materials on patient's individual exercise prescription Provide education, explanation, and written materials on patient's individual exercise prescription      Expected Outcomes Short Term: Able to explain program exercise prescription;Long Term: Able to explain home exercise prescription to exercise independently Short Term: Able to explain program exercise prescription;Long Term: Able to explain home exercise prescription to exercise independently               Exercise Goals Re-Evaluation :  Exercise Goals Re-Evaluation     Row Name 10/22/20 1326 11/19/20 1517 12/18/20 1001         Exercise Goal Re-Evaluation   Exercise Goals Review Increase Physical Activity;Increase Strength and Stamina;Able to understand and use rate of perceived exertion (RPE) scale;Able to understand and use Dyspnea scale;Knowledge and understanding of Target Heart Rate Range (THRR);Understanding of Exercise Prescription Increase Physical Activity;Increase Strength and Stamina;Able to understand and use rate of perceived exertion (RPE) scale;Able to understand and use Dyspnea scale;Knowledge and understanding of Target Heart Rate Range (THRR);Understanding of Exercise Prescription Increase Physical Activity;Increase Strength and Stamina;Able to understand and use rate of perceived exertion (RPE) scale;Able to understand and use Dyspnea scale;Knowledge and understanding of Target Heart Rate Range  (THRR);Understanding of Exercise Prescription     Comments Darren Wood has completed 4 exercise sessions and has tolerated well so far. He does have chronic hip pain that causes him to walk with a limp and he uses a cane for assistance. He has not complained of exercise making the pain any worse so far. He is able to do 15 minutes on the Nustep and 15 minutes on the arm ergometer without taking any rest breaks. In the short  amount of time that he has been in the program he has already progressed with workload and MET level increases. He is exercising at 2.0 METS on the Nustep and 42 RPM's on the arm ergometer. Will continue to monitor and progress as he is able. Darren Wood has completed 10 exercise sessions and has been consistent with workload and MET level increases. His attendance has been good and it seems like he is enjoying the exercise. We have discussed home exercise and he has started exercising at home at least 2 days a week. He is exercising at 2.3 METS on the Nustep and 44 RPM's on Level 3.5 on the Arm Ergometer. Will continue to monitor and progress as he is able. Darren Wood has completed 15 exercise sessions. He has increased his workload on the arm ergometer but not the Nustep. He increased his METs on the Nustep. Darren Wood averages 2.8 METs on the Nustep and 43 RPM at level 4 on the arm ergometer. He graduates today as I feel confident that he will do well with his home exercise.     Expected Outcomes Through exercise at rehab and home, the patient will decrease shortness of breath with daily activities and feel confident in carrying out an exercise regimn at home. Through exercise at rehab and home, the patient will decrease shortness of breath with daily activities and feel confident in carrying out an exercise regimn at home. Through exercise at rehab and home, the patient will decrease shortness of breath with daily activities and feel confident in carrying out an exercise regimn at home.               Discharge Exercise Prescription (Final Exercise Prescription Changes):  Exercise Prescription Changes - 12/11/20 1500       Response to Exercise   Blood Pressure (Admit) 134/78    Blood Pressure (Exercise) 130/86    Blood Pressure (Exit) 110/80    Heart Rate (Admit) 106 bpm    Heart Rate (Exercise) 121 bpm    Heart Rate (Exit) 112 bpm    Oxygen Saturation (Admit) 99 %    Oxygen Saturation (Exercise) 96 %    Oxygen Saturation (Exit) 98 %    Rating of Perceived Exertion (Exercise) 11    Perceived Dyspnea (Exercise) 0    Duration Continue with 30 min of aerobic exercise without signs/symptoms of physical distress.    Intensity THRR unchanged      Progression   Progression Continue to progress workloads to maintain intensity without signs/symptoms of physical distress.      Resistance Training   Training Prescription Yes    Weight blue bands    Reps 10-15    Time 10 Minutes      Oxygen   Oxygen Continuous    Liters 1   is requiring 1 L of supplemental oxygen when performing seated exercise.     NuStep   Level 5    SPM 80    Minutes 15    METs 2.3      Arm Ergometer   Level 4    Watts 42    Minutes 15             Nutrition:  Target Goals: Understanding of nutrition guidelines, daily intake of sodium '1500mg'$ , cholesterol '200mg'$ , calories 30% from fat and 7% or less from saturated fats, daily to have 5 or more servings of fruits and vegetables.  Biometrics:  Pre Biometrics - 10/05/20 1515       Pre Biometrics  Grip Strength 37 kg              Nutrition Therapy Plan and Nutrition Goals:  Nutrition Therapy & Goals - 10/17/20 0808       Nutrition Therapy   Diet General Healthful    Drug/Food Interactions Statins/Certain Fruits      Personal Nutrition Goals   Nutrition Goal Pt to identify food quantities necessary to achieve weight loss of 6-24 lb at graduation from cardiac rehab.    Personal Goal #2 Pt to build a healthy plate including  vegetables, fruits, whole grains, and low-fat dairy products in a heart healthy meal plan.    Personal Goal #3 Pt to learn how to incorporate more nutrient dense foods into diet by tracking foods and reading food labels to understand foods he is eating      Intervention Plan   Intervention Prescribe, educate and counsel regarding individualized specific dietary modifications aiming towards targeted core components such as weight, hypertension, lipid management, diabetes, heart failure and other comorbidities.;Nutrition handout(s) given to patient.    Expected Outcomes Long Term Goal: Adherence to prescribed nutrition plan.;Short Term Goal: A plan has been developed with personal nutrition goals set during dietitian appointment.             Nutrition Assessments:  MEDIFICTS Score Key: ?70 Need to make dietary changes  40-70 Heart Healthy Diet ? 40 Therapeutic Level Cholesterol Diet  Flowsheet Row PULMONARY REHAB OTHER RESPIRATORY from 10/16/2020 in [place]  Picture Your Plate Total Score on Admission 41      Picture Your Plate Scores: <83 Unhealthy dietary pattern with much room for improvement. 41-50 Dietary pattern unlikely to meet recommendations for good health and room for improvement. 51-60 More healthful dietary pattern, with some room for improvement.  >60 Healthy dietary pattern, although there may be some specific behaviors that could be improved.    Nutrition Goals Re-Evaluation:  Nutrition Goals Re-Evaluation     Darren Wood Name 10/17/20 0808 10/23/20 0919 11/19/20 1425 12/13/20 0754       Goals   Current Weight 266 lb (120.7 kg) 266 lb 15.6 oz (121.1 kg) 266 lb 15.6 oz (121.1 kg) 262 lb 9.1 oz (119.1 kg)    Nutrition Goal -- Pt to identify food quantities necessary to achieve weight loss of 6-24 lb at graduation from cardiac rehab. Pt to identify food quantities necessary to achieve weight loss of 6-24 lb at graduation from cardiac rehab.  Pt to identify food quantities necessary to achieve weight loss of 6-24 lb at graduation from cardiac rehab.         Personal Goal #2 Re-Evaluation   Personal Goal #2 -- Pt to build a healthy plate including vegetables, fruits, whole grains, and low-fat dairy products in a heart healthy meal plan. Pt to build a healthy plate including vegetables, fruits, whole grains, and low-fat dairy products in a heart healthy meal plan. Pt to build a healthy plate including vegetables, fruits, whole grains, and low-fat dairy products in a heart healthy meal plan.         Personal Goal #3 Re-Evaluation   Personal Goal #3 -- Pt to learn how to incorporate more nutrient dense foods into diet by tracking foods and reading food labels to understand foods he is eating Pt to learn how to incorporate more nutrient dense foods into diet by tracking foods and reading food labels to understand foods he is eating Pt to learn how to incorporate more  nutrient dense foods into diet by tracking foods and reading food labels to understand foods he is eating             Nutrition Goals Discharge (Final Nutrition Goals Re-Evaluation):  Nutrition Goals Re-Evaluation - 12/13/20 0754       Goals   Current Weight 262 lb 9.1 oz (119.1 kg)    Nutrition Goal Pt to identify food quantities necessary to achieve weight loss of 6-24 lb at graduation from cardiac rehab.      Personal Goal #2 Re-Evaluation   Personal Goal #2 Pt to build a healthy plate including vegetables, fruits, whole grains, and low-fat dairy products in a heart healthy meal plan.      Personal Goal #3 Re-Evaluation   Personal Goal #3 Pt to learn how to incorporate more nutrient dense foods into diet by tracking foods and reading food labels to understand foods he is eating             Psychosocial: Target Goals: Acknowledge presence or absence of significant depression and/or stress, maximize coping skills, provide positive support system. Participant  is able to verbalize types and ability to use techniques and skills needed for reducing stress and depression.  Initial Review & Psychosocial Screening:  Initial Psych Review & Screening - 10/05/20 1409       Initial Review   Current issues with None Identified      Family Dynamics   Good Support System? Yes   Wife, daughter, son, sister, and church family     Barriers   Psychosocial barriers to participate in program The patient should benefit from training in stress management and relaxation.      Screening Interventions   Interventions Encouraged to exercise    Expected Outcomes Long Term Goal: Stressors or current issues are controlled or eliminated.;Long Term goal: The participant improves quality of Life and PHQ9 Scores as seen by post scores and/or verbalization of changes             Quality of Life Scores:  Scores of 19 and below usually indicate a poorer quality of life in these areas.  A difference of  2-3 points is a clinically meaningful difference.  A difference of 2-3 points in the total score of the Quality of Life Index has been associated with significant improvement in overall quality of life, self-image, physical symptoms, and general health in studies assessing change in quality of life.  PHQ-9: Recent Review Flowsheet Data     Depression screen [facility] 2/9 10/05/2020   Decreased Interest 0   Down, Depressed, Hopeless 1   PHQ - 2 Score 1   Altered sleeping 0   Tired, decreased energy 2   Change in appetite 0   Feeling bad or failure about yourself  0   Trouble concentrating 0   Moving slowly or fidgety/restless 0   Suicidal thoughts 0   Difficult doing work/chores Not difficult at all      Interpretation of Total Score  Total Score Depression Severity:  1-4 = Minimal depression, 5-9 = Mild depression, 10-14 = Moderate depression, 15-19 = Moderately severe depression, 20-27 = Severe depression   Psychosocial Evaluation and Intervention:  Psychosocial  Evaluation - 10/05/20 1521       Psychosocial Evaluation & Interventions   Interventions Encouraged to exercise with the program and follow exercise prescription    Comments Patient does not show any psychosocial barriers and seems to have a positive outlook on life. He has a  great support system that consists of family and church members.    Expected Outcomes Pt to continue positive outlook and continued mental well being through healthy coping mechanisms    Continue Psychosocial Services  No Follow up required             Psychosocial Re-Evaluation:  Psychosocial Re-Evaluation     [place] Name 10/22/20 1018 11/19/20 1421 12/17/20 1436         Psychosocial Re-Evaluation   Current issues with None Identified None Identified None Identified     Comments No psychosocial concerns identified at this time. No concerns identified. No concerns identified.     Expected Outcomes For Darren Wood to continue to be free of psychosocial concerns while particpating in pulmonary rehab. Continue with no psychosocial concerns while participating in pulmonary rehab. Darren Wood graduated from pulmonary rehab 12/18/2020.     Interventions Encouraged to attend Pulmonary Rehabilitation for the exercise Encouraged to attend Pulmonary Rehabilitation for the exercise Encouraged to attend Pulmonary Rehabilitation for the exercise     Continue Psychosocial Services  No Follow up required No Follow up required No Follow up required              Psychosocial Discharge (Final Psychosocial Re-Evaluation):  Psychosocial Re-Evaluation - 12/17/20 1436       Psychosocial Re-Evaluation   Current issues with None Identified    Comments No concerns identified.    Expected Outcomes Darren Wood graduated from pulmonary rehab 12/18/2020.    Interventions Encouraged to attend Pulmonary Rehabilitation for the exercise    Continue Psychosocial Services  No Follow up required             Education: Education Goals: Education  classes will be provided on a weekly basis, covering required topics. Participant will state understanding/return demonstration of topics presented.  Learning Barriers/Preferences:  Learning Barriers/Preferences - 10/05/20 1410       Learning Barriers/Preferences   Learning Barriers None   Wears reading glasses   Learning Preferences Written Material;Skilled Demonstration;Individual Instruction             Education Topics: Risk Factor Reduction:  -Group instruction that is supported by a PowerPoint presentation. Instructor discusses the definition of a risk factor, different risk factors for pulmonary disease, and how the heart and lungs work together.     Nutrition for Pulmonary Patient:  -Group instruction provided by PowerPoint slides, verbal discussion, and written materials to support subject matter. The instructor gives an explanation and review of healthy diet recommendations, which includes a discussion on weight management, recommendations for fruit and vegetable consumption, as well as protein, fluid, caffeine, fiber, sodium, sugar, and alcohol. Tips for eating when patients are short of breath are discussed. Flowsheet Row PULMONARY REHAB OTHER RESPIRATORY from 12/13/2020 in [place]  Date 10/18/20  Educator handout       Pursed Lip Breathing:  -Group instruction that is supported by demonstration and informational handouts. Instructor discusses the benefits of pursed lip and diaphragmatic breathing and detailed demonstration on how to preform both.     Oxygen Safety:  -Group instruction provided by PowerPoint, verbal discussion, and written material to support subject matter. There is an overview of "What is Oxygen" and "Why do we need it".  Instructor also reviews how to create a safe environment for oxygen use, the importance of using oxygen as prescribed, and the risks of noncompliance. There is a brief discussion on traveling with  oxygen and resources the patient may utilize. Flowsheet  Row PULMONARY REHAB OTHER RESPIRATORY from 12/13/2020 in [place]  Date 10/11/20  Educator Handout       Oxygen Equipment:  -Group instruction provided by [facility] Staff utilizing handouts, written materials, and equipment demonstrations.   Signs and Symptoms:  -Group instruction provided by written material and verbal discussion to support subject matter. Warning signs and symptoms of infection, stroke, and heart attack are reviewed and when to call the physician/911 reinforced. Tips for preventing the spread of infection discussed.   Advanced Directives:  -Group instruction provided by verbal instruction and written material to support subject matter. Instructor reviews Advanced Directive laws and proper instruction for filling out document.   Pulmonary Video:  -Group video education that reviews the importance of medication and oxygen compliance, exercise, good nutrition, pulmonary hygiene, and pursed lip and diaphragmatic breathing for the pulmonary patient.   Exercise for the Pulmonary Patient:  -Group instruction that is supported by a PowerPoint presentation. Instructor discusses benefits of exercise, core components of exercise, frequency, duration, and intensity of an exercise routine, importance of utilizing pulse oximetry during exercise, safety while exercising, and options of places to exercise outside of rehab.   Flowsheet Row PULMONARY REHAB OTHER RESPIRATORY from 12/13/2020 in [place]  Date 11/29/20  Educator Jess-H/O  [METS]  Instruction Review Code 1- Verbalizes Understanding       Pulmonary Medications:  -Verbally interactive group education provided by instructor with focus on inhaled medications and proper administration.   Anatomy and Physiology of the Respiratory System and Intimacy:  -Group instruction provided by PowerPoint,  verbal discussion, and written material to support subject matter. Instructor reviews respiratory cycle and anatomical components of the respiratory system and their functions. Instructor also reviews differences in obstructive and restrictive respiratory diseases with examples of each. Intimacy, Sex, and Sexuality differences are reviewed with a discussion on how relationships can change when diagnosed with pulmonary disease. Common sexual concerns are reviewed. Flowsheet Row PULMONARY REHAB OTHER RESPIRATORY from 12/13/2020 in [place]  Date 12/13/20  Educator Handout       MD Wood -A group question and answer session with a medical doctor that allows participants to ask questions that relate to their pulmonary disease state.   OTHER EDUCATION -Group or individual verbal, written, or video instructions that support the educational goals of the pulmonary rehab program. [place] from 12/13/2020 in [place]  Date 11/29/20  Educator Berton Mount METS]       Holiday Eating Survival Tips:  -Group instruction provided by PowerPoint slides, verbal discussion, and written materials to support subject matter. The instructor gives patients tips, tricks, and techniques to help them not only survive but enjoy the holidays despite the onslaught of food that accompanies the holidays.   Knowledge Questionnaire Score:  Knowledge Questionnaire Score - 10/05/20 1528       Knowledge Questionnaire Score   Pre Score 5/8   Did not answer questions on back of sheet            Core Components/Risk Factors/Patient Goals at Admission:  Personal Goals and Risk Factors at Admission - 10/05/20 1524       Core Components/Risk Factors/Patient Goals on Admission   Improve shortness of breath with ADL's Yes    Intervention Provide education, individualized exercise plan and daily activity  instruction to help decrease symptoms of SOB with activities of daily  living.    Expected Outcomes Short Term: Improve cardiorespiratory fitness to achieve a reduction of symptoms when performing ADLs;Long Term: Be able to perform more ADLs without symptoms or delay the onset of symptoms    Hypertension Yes    Intervention Provide education on lifestyle modifcations including regular physical activity/exercise, weight management, moderate sodium restriction and increased consumption of fresh fruit, vegetables, and low fat dairy, alcohol moderation, and smoking cessation.;Monitor prescription use compliance.    Expected Outcomes Short Term: Continued assessment and intervention until BP is < 140/34mm HG in hypertensive participants. < 130/41mm HG in hypertensive participants with diabetes, heart failure or chronic kidney disease.;Long Term: Maintenance of blood pressure at goal levels.             Core Components/Risk Factors/Patient Goals Review:   Goals and Risk Factor Review     Row Name 10/22/20 1019 11/19/20 1422 12/17/20 1437         Core Components/Risk Factors/Patient Goals Review   Personal Goals Review Develop more efficient breathing techniques such as purse lipped breathing and diaphragmatic breathing and practicing self-pacing with activity.;Increase knowledge of respiratory medications and ability to use respiratory devices properly.;Improve shortness of breath with ADL's Develop more efficient breathing techniques such as purse lipped breathing and diaphragmatic breathing and practicing self-pacing with activity.;Increase knowledge of respiratory medications and ability to use respiratory devices properly.;Improve shortness of breath with ADL's Develop more efficient breathing techniques such as purse lipped breathing and diaphragmatic breathing and practicing self-pacing with activity.;Increase knowledge of respiratory medications and ability to use respiratory devices  properly.;Improve shortness of breath with ADL's     Review Darren Wood started pulmonary rehab 2 weeks ago, it is too early to see progression toward program goals.  He is doing well and exercising at 2.0 mets on the nustep and 47 watts on the arm ergomenter. Darren Wood has made good progress in his exercise despite orthopedic pain.  He is exercising at 2.3 mets on the nustep and level 3.5 on the arm ergomenter.  He is much less conditioned since participating in pulmonary rehab. Darren Wood graduates 12/18/2020.  His progress has been limited by orthopedic problems and he needs a hip replacement.  He has done all he can do to progress.     Expected Outcomes See admission goals. See admission goals. See admission goals.              Core Components/Risk Factors/Patient Goals at Discharge (Final Review):   Goals and Risk Factor Review - 12/17/20 1437       Core Components/Risk Factors/Patient Goals Review   Personal Goals Review Develop more efficient breathing techniques such as purse lipped breathing and diaphragmatic breathing and practicing self-pacing with activity.;Increase knowledge of respiratory medications and ability to use respiratory devices properly.;Improve shortness of breath with ADL's    Review Darren Wood graduates 12/18/2020.  His progress has been limited by orthopedic problems and he needs a hip replacement.  He has done all he can do to progress.    Expected Outcomes See admission goals.             ITP Comments:   Comments: ITP REVIEW Pt is making expected progress toward pulmonary rehab goals after completing 4 sessions. Recommend continued exercise, life style modification, education, and utilization of breathing techniques to increase stamina and strength and decrease shortness of breath with exertion.

## 2020-12-19 NOTE — Telephone Encounter (Signed)
Patient's wife called and left VM asking for status of disability paperwork.  I called her back and left a VM letter her know that we will complete it after Mr. Greulich is seen by Dr. Maple Hudson on 8/18.

## 2020-12-19 NOTE — Progress Notes (Signed)
12/19/20- 60 yoM never smoker for sleep evaluation courtesy of Dr Meryle Ready 11/02/20) Medical problem list includes Bronchiectasis, HTN, Covid pneumonia, June 2022, Chronic Respiratory Failure with Hypoxia, Elevated Hemidiaphragm, Hypercholesterolemia, Obesity, O2 2L used only for exertion Epworth score-4 Body weight today-262 lbs Covid vax- Complains of snoring, tiredd, waking often, tossing and turning. No ENT surgery. No lung problems prior to Covid infection. No heart probs. No parasomnias. Says lost 40 lbs related to Covid infection.  CT chest 11/29/20- IMPRESSION: 1. Pulmonary parenchymal pattern of peripheral and basilar predominant coarsened ground-glass and bronchiectasis/bronchiolectasis, minimally improved from 05/16/2020 and in keeping with sequelae of COVID-19 pneumonia. 2. Hepatic steatosis. 3. Enlarged pulmonary arteries, indicative of pulmonary arterial hypertension.  Prior to Admission medications   Medication Sig Start Date End Date Taking? Authorizing Provider  acetaminophen (TYLENOL) 500 MG tablet Take 500 mg by mouth every 6 (six) hours as needed for fever.   Yes [provider]  atorvastatin (LIPITOR) 10 MG tablet Take 10 mg by mouth daily.   Yes [provider]  bismuth subsalicylate (PEPTO BISMOL) 262 MG/15ML suspension Take 30 mLs by mouth every 6 (six) hours as needed for indigestion.   Yes [provider]  losartan-hydrochlorothiazide (HYZAAR) 100-12.5 MG tablet Take 1 tablet by mouth daily. 01/31/20  Yes [provider]   Past Medical History:  Diagnosis Date   Hypertension    Past Surgical History:  Procedure Laterality Date   REVISION TOTAL HIP ARTHROPLASTY     History reviewed. No pertinent family history. Social History   Socioeconomic History   Marital status: Married    Spouse name: Not on file   Number of children: Not on file   Years of education: Not on file   Highest education level: Not on file   Occupational History   Not on file  Tobacco Use   Smoking status: Never   Smokeless tobacco: Never  Substance and Sexual Activity   Alcohol use: Never   Drug use: Never   Sexual activity: Not on file  Other Topics Concern   Not on file  Social History Narrative   Not on file   Social Determinants of Health   Financial Resource Strain: Not on file  Food Insecurity: Not on file  Transportation Needs: Not on file  Physical Activity: Not on file  Stress: Not on file  Social Connections: Not on file  Intimate Partner Violence: Not on file   ROS-see HPI   + = positive Constitutional:    weight loss, night sweats, fevers, chills, fatigue, lassitude. HEENT:    headaches, difficulty swallowing, +tooth/dental problems, sore throat,       +sneezing, itching, ear ache, nasal congestion, post nasal drip, snoring CV:    chest pain, orthopnea, PND, swelling in lower extremities, anasarca,                                   dizziness, palpitations Resp:   +shortness of breath with exertion or at rest.                productive cough,   non-productive cough, coughing up of blood.              change in color of mucus.  wheezing.   Skin:    rash or lesions. GI:  No-   heartburn, +indigestion, abdominal pain, nausea, vomiting, diarrhea,  change in bowel habits, loss of appetite GU: dysuria, change in color of urine, no urgency or frequency.   flank pain. MS:   joint pain, +stiffness, decreased range of motion, back pain. Neuro-     nothing unusual Psych:  change in mood or affect.  depression or anxiety.   memory loss.   OBJ- Physical Exam General- Alert, Oriented, Affect-appropriate, Distress- none acute, + obese  Skin- rash-none, lesions- none, excoriation- none Lymphadenopathy- none Head- atraumatic            Eyes- Gross vision intact, PERRLA, conjunctivae and secretions clear            Ears- Hearing, canals-normal            Nose- Clear, no-Septal dev, mucus,  polyps, erosion, perforation             Throat- Mallampati IV , mucosa clear , drainage- none, tonsils- atrophic, + teeth Neck- flexible , trachea midline, no stridor , thyroid nl, carotid no bruit Chest - symmetrical excursion , unlabored           Heart/CV- RRR , no murmur , no gallop  , no rub, nl s1 s2                           - JVD- none , edema- none, stasis changes- none, varices- none           Lung- clear to P&A, wheeze- none, cough- none , dullness-none, rub- none           Chest wall-  Abd-  Br/ Gen/ Rectal- Not done, not indicated Extrem- cyanosis- none, clubbing, none, atrophy- none, strength- nl Neuro- grossly intact to observation

## 2020-12-20 ENCOUNTER — Ambulatory Visit (INDEPENDENT_AMBULATORY_CARE_PROVIDER_SITE_OTHER): Payer: BC Managed Care – PPO | Admitting: Internal Medicine

## 2020-12-20 ENCOUNTER — Encounter: Payer: Self-pay | Admitting: Internal Medicine

## 2020-12-20 ENCOUNTER — Other Ambulatory Visit: Payer: Self-pay

## 2020-12-20 VITALS — BP 136/74 | HR 91 | Temp 98.2°F | Ht 71.0 in | Wt 262.2 lb

## 2020-12-20 DIAGNOSIS — J9611 Chronic respiratory failure with hypoxia: Secondary | ICD-10-CM | POA: Diagnosis not present

## 2020-12-20 DIAGNOSIS — R0683 Snoring: Secondary | ICD-10-CM | POA: Diagnosis not present

## 2020-12-20 NOTE — Patient Instructions (Signed)
Order- schedule home sleep test    dx Snoring ° °Please call us for results and recommendations about 2 weeks after your sleep test. ° ° °

## 2020-12-21 ENCOUNTER — Telehealth: Payer: Self-pay | Admitting: Internal Medicine

## 2020-12-23 ENCOUNTER — Encounter: Payer: Self-pay | Admitting: Internal Medicine

## 2020-12-23 DIAGNOSIS — R0683 Snoring: Secondary | ICD-10-CM | POA: Insufficient documentation

## 2020-12-23 DIAGNOSIS — G4733 Obstructive sleep apnea (adult) (pediatric): Secondary | ICD-10-CM | POA: Insufficient documentation

## 2020-12-23 NOTE — Assessment & Plan Note (Signed)
At significant risk for OSA. Appropriate discussion and questions answered. Plan- seep study

## 2020-12-23 NOTE — Assessment & Plan Note (Signed)
Post Covid, now using O2 mainly with exertion

## 2020-12-24 ENCOUNTER — Telehealth: Payer: Self-pay | Admitting: Internal Medicine

## 2020-12-24 NOTE — Telephone Encounter (Signed)
Patient was seen on 8/18 by Dr. Maple Hudson.  Darren Wood has disability forms and will complete them for provider signature.

## 2020-12-25 NOTE — Telephone Encounter (Signed)
I returned call to patient's wife and left a voice message.  The disability forms are on Dr. Roxy Cedar desk for signature and we should be able to fax them this afternoon.

## 2020-12-25 NOTE — Telephone Encounter (Signed)
Dr. Maple Hudson signed disability forms and I faxed to [facility] fax# [phone].  I called Mrs. Sapien and let her know this was done.

## 2021-01-03 NOTE — Progress Notes (Signed)
Discharge Progress Report  Patient Details  Name: Darren Wood MRN: [id number] Date of Birth: 05-26-1960 Referring Provider:   [facility] Walk Test from 10/05/2020 in [place]  Referring Provider Dr. Camillo Flaming        Number of Visits: 16  Reason for Discharge:  Patient reached a stable level of exercise. Patient independent in their exercise. Patient has met program and personal goals.  Smoking History:  Social History   Tobacco Use  Smoking Status Never  Smokeless Tobacco Never    Diagnosis:  Bronchiectasis with acute lower respiratory infection ([place])  ADL [facility]:  Pulmonary Assessment Scores     Row Name 10/05/20 1530 12/18/20 1537       ADL [facility]   ADL Phase Entry Exit    SOB Score total 46 47         CAT Score   CAT Score 17 18         mMRC Score   mMRC Score 4 4             Initial Exercise Prescription:  Initial Exercise Prescription - 10/05/20 1500       Date of Initial Exercise RX and Referring Provider   Date 10/05/20    Referring Provider Dr. Camillo Flaming    Expected Discharge Date 12/06/20      Oxygen   Oxygen Continuous    Liters 2      NuStep   Level 1    SPM 80    Minutes 15      Arm Ergometer   Level 1    Minutes 15      Intensity   THRR 40-80% of Max Heartrate 63-126    Ratings of Perceived Exertion 11-13    Perceived Dyspnea 0-4      Progression   Progression Continue to progress workloads to maintain intensity without signs/symptoms of physical distress.      Resistance Training   Training Prescription Yes    Weight Blue bands    Reps 10-15             Discharge Exercise Prescription (Final Exercise Prescription Changes):  Exercise Prescription Changes - 12/11/20 1500       Response to Exercise   Blood Pressure (Admit) 134/78    Blood Pressure (Exercise) 130/86    Blood Pressure (Exit) 110/80    Heart Rate (Admit) 106 bpm    Heart Rate (Exercise) 121 bpm     Heart Rate (Exit) 112 bpm    Oxygen Saturation (Admit) 99 %    Oxygen Saturation (Exercise) 96 %    Oxygen Saturation (Exit) 98 %    Rating of Perceived Exertion (Exercise) 11    Perceived Dyspnea (Exercise) 0    Duration Continue with 30 min of aerobic exercise without signs/symptoms of physical distress.    Intensity THRR unchanged      Progression   Progression Continue to progress workloads to maintain intensity without signs/symptoms of physical distress.      Resistance Training   Training Prescription Yes    Weight blue bands    Reps 10-15    Time 10 Minutes      Oxygen   Oxygen Continuous    Liters 1   is requiring 1 L of supplemental oxygen when performing seated exercise.     NuStep   Level 5    SPM 80    Minutes 15    METs 2.3  Arm Ergometer   Level 4    Watts 42    Minutes 15             Functional Capacity:  6 Minute Walk     Row Name 10/05/20 1518 12/18/20 1441       6 Minute Walk   Phase Initial Discharge    Distance 800 feet 841 feet    Distance % Change -- 5.13 %    Distance Feet Change -- 41 ft    Walk Time 6 minutes 6 minutes    # of Rest Breaks 0 0    MPH 1.52 1.59    METS 2.29 2.38    RPE 12 11    Perceived Dyspnea  1 1    VO2 Peak 8.03 8.32    Symptoms Yes (comment) Yes (comment)    Comments Hip and knee pain 5/10-chronic Hip pain 5/10, used wheelchair    Resting HR 91 bpm 102 bpm    Resting BP 124/86 146/80    Resting Oxygen Saturation  98 % 99 %    Exercise Oxygen Saturation  during 6 min walk 92 % 94 %    Max Ex. HR 132 bpm 134 bpm    Max Ex. BP 144/80 140/80    2 Minute Post BP 132/82 130/78         Interval HR   1 Minute HR 123 123    2 Minute HR 124 125    3 Minute HR 125 131    4 Minute HR 130 133    5 Minute HR 130 131    6 Minute HR 132 134    2 Minute Post HR 109 113    Interval Heart Rate? Yes Yes         Interval Oxygen   Interval Oxygen? Yes Yes    Baseline Oxygen Saturation % 98 % 99 %    1  Minute Oxygen Saturation % 93 % 96 %    1 Minute Liters of Oxygen 2 L 1 L    2 Minute Oxygen Saturation % 96 % 95 %    2 Minute Liters of Oxygen 2 L 1 L    3 Minute Oxygen Saturation % 96 % 95 %    3 Minute Liters of Oxygen 2 L 1 L    4 Minute Oxygen Saturation % 94 % 94 %    4 Minute Liters of Oxygen 2 L 1 L    5 Minute Oxygen Saturation % 95 % 95 %    5 Minute Liters of Oxygen 2 L 1 L    6 Minute Oxygen Saturation % 92 % 95 %    6 Minute Liters of Oxygen 2 L 1 L    2 Minute Post Oxygen Saturation % 99 % 98 %    2 Minute Post Liters of Oxygen 2 L 1 L             Psychological, QOL, Others - Outcomes: PHQ 2/9: Depression screen [facility] 2/9 12/18/2020 10/05/2020  Decreased Interest 0 0  Down, Depressed, Hopeless 0 1  PHQ - 2 Score 0 1  Altered sleeping 2 0  Tired, decreased energy 1 2  Change in appetite 0 0  Feeling bad or failure about yourself  0 0  Trouble concentrating 0 0  Moving slowly or fidgety/restless 0 0  Suicidal thoughts 0 0  PHQ-9 Score 3 -  Difficult doing work/chores Not difficult at all Not difficult  at all    Quality of Life:   Personal Goals: Goals established at orientation with interventions provided to work toward goal.  Personal Goals and Risk Factors at Admission - 10/05/20 1524       Core Components/Risk Factors/Patient Goals on Admission   Improve shortness of breath with ADL's Yes    Intervention Provide education, individualized exercise plan and daily activity instruction to help decrease symptoms of SOB with activities of daily living.    Expected Outcomes Short Term: Improve cardiorespiratory fitness to achieve a reduction of symptoms when performing ADLs;Long Term: Be able to perform more ADLs without symptoms or delay the onset of symptoms    Hypertension Yes    Intervention Provide education on lifestyle modifcations including regular physical activity/exercise, weight management, moderate sodium restriction and increased consumption of  fresh fruit, vegetables, and low fat dairy, alcohol moderation, and smoking cessation.;Monitor prescription use compliance.    Expected Outcomes Short Term: Continued assessment and intervention until BP is < 140/74m HG in hypertensive participants. < 130/811mHG in hypertensive participants with diabetes, heart failure or chronic kidney disease.;Long Term: Maintenance of blood pressure at goal levels.              Personal Goals Discharge:  Goals and Risk Factor Review     Row Name 10/22/20 1019 11/19/20 1422 12/17/20 1437         Core Components/Risk Factors/Patient Goals Review   Personal Goals Review Develop more efficient breathing techniques such as purse lipped breathing and diaphragmatic breathing and practicing self-pacing with activity.;Increase knowledge of respiratory medications and ability to use respiratory devices properly.;Improve shortness of breath with ADL's Develop more efficient breathing techniques such as purse lipped breathing and diaphragmatic breathing and practicing self-pacing with activity.;Increase knowledge of respiratory medications and ability to use respiratory devices properly.;Improve shortness of breath with ADL's Develop more efficient breathing techniques such as purse lipped breathing and diaphragmatic breathing and practicing self-pacing with activity.;Increase knowledge of respiratory medications and ability to use respiratory devices properly.;Improve shortness of breath with ADL's     Review MaOrlatarted pulmonary rehab 2 weeks ago, it is too early to see progression toward program goals.  He is doing well and exercising at 2.0 mets on the nustep and 47 watts on the arm ergomenter. MaCristonas made good progress in his exercise despite orthopedic pain.  He is exercising at 2.3 mets on the nustep and level 3.5 on the arm ergomenter.  He is much less conditioned since participating in pulmonary rehab. Chosen graduates 12/18/2020.  His progress has been  limited by orthopedic problems and he needs a hip replacement.  He has done all he can do to progress.     Expected Outcomes See admission goals. See admission goals. See admission goals.              Exercise Goals and Review:  Exercise Goals     Row Name 10/05/20 1515 12/18/20 1000           Exercise Goals   Increase Physical Activity Yes Yes      Intervention Provide advice, education, support and counseling about physical activity/exercise needs.;Develop an individualized exercise prescription for aerobic and resistive training based on initial evaluation findings, risk stratification, comorbidities and participant's personal goals. Provide advice, education, support and counseling about physical activity/exercise needs.;Develop an individualized exercise prescription for aerobic and resistive training based on initial evaluation findings, risk stratification, comorbidities and participant's personal goals.      Expected  Outcomes Short Term: Attend rehab on a regular basis to increase amount of physical activity.;Long Term: Add in home exercise to make exercise part of routine and to increase amount of physical activity.;Long Term: Exercising regularly at least 3-5 days a week. Short Term: Attend rehab on a regular basis to increase amount of physical activity.;Long Term: Add in home exercise to make exercise part of routine and to increase amount of physical activity.;Long Term: Exercising regularly at least 3-5 days a week.      Increase Strength and Stamina Yes Yes      Intervention Provide advice, education, support and counseling about physical activity/exercise needs.;Develop an individualized exercise prescription for aerobic and resistive training based on initial evaluation findings, risk stratification, comorbidities and participant's personal goals. Provide advice, education, support and counseling about physical activity/exercise needs.;Develop an individualized exercise  prescription for aerobic and resistive training based on initial evaluation findings, risk stratification, comorbidities and participant's personal goals.      Expected Outcomes Short Term: Increase workloads from initial exercise prescription for resistance, speed, and METs.;Short Term: Perform resistance training exercises routinely during rehab and add in resistance training at home;Long Term: Improve cardiorespiratory fitness, muscular endurance and strength as measured by increased METs and functional capacity (6MWT) Short Term: Increase workloads from initial exercise prescription for resistance, speed, and METs.;Short Term: Perform resistance training exercises routinely during rehab and add in resistance training at home;Long Term: Improve cardiorespiratory fitness, muscular endurance and strength as measured by increased METs and functional capacity (6MWT)      Able to understand and use rate of perceived exertion (RPE) scale Yes Yes      Intervention Provide education and explanation on how to use RPE scale Provide education and explanation on how to use RPE scale      Expected Outcomes Short Term: Able to use RPE daily in rehab to express subjective intensity level;Long Term:  Able to use RPE to guide intensity level when exercising independently Short Term: Able to use RPE daily in rehab to express subjective intensity level;Long Term:  Able to use RPE to guide intensity level when exercising independently      Able to understand and use Dyspnea scale Yes Yes      Intervention Provide education and explanation on how to use Dyspnea scale Provide education and explanation on how to use Dyspnea scale      Expected Outcomes Short Term: Able to use Dyspnea scale daily in rehab to express subjective sense of shortness of breath during exertion;Long Term: Able to use Dyspnea scale to guide intensity level when exercising independently Short Term: Able to use Dyspnea scale daily in rehab to express  subjective sense of shortness of breath during exertion;Long Term: Able to use Dyspnea scale to guide intensity level when exercising independently      Knowledge and understanding of Target Heart Rate Range (THRR) Yes Yes      Intervention Provide education and explanation of THRR including how the numbers were predicted and where they are located for reference Provide education and explanation of THRR including how the numbers were predicted and where they are located for reference      Expected Outcomes Short Term: Able to state/look up THRR;Long Term: Able to use THRR to govern intensity when exercising independently;Short Term: Able to use daily as guideline for intensity in rehab Short Term: Able to state/look up THRR;Long Term: Able to use THRR to govern intensity when exercising independently;Short Term: Able to use daily as guideline  for intensity in rehab      Understanding of Exercise Prescription Yes Yes      Intervention Provide education, explanation, and written materials on patient's individual exercise prescription Provide education, explanation, and written materials on patient's individual exercise prescription      Expected Outcomes Short Term: Able to explain program exercise prescription;Long Term: Able to explain home exercise prescription to exercise independently Short Term: Able to explain program exercise prescription;Long Term: Able to explain home exercise prescription to exercise independently               Exercise Goals Re-Evaluation:  Exercise Goals Re-Evaluation     Row Name 10/22/20 1326 11/19/20 1517 12/18/20 1001         Exercise Goal Re-Evaluation   Exercise Goals Review Increase Physical Activity;Increase Strength and Stamina;Able to understand and use rate of perceived exertion (RPE) scale;Able to understand and use Dyspnea scale;Knowledge and understanding of Target Heart Rate Range (THRR);Understanding of Exercise Prescription Increase Physical  Activity;Increase Strength and Stamina;Able to understand and use rate of perceived exertion (RPE) scale;Able to understand and use Dyspnea scale;Knowledge and understanding of Target Heart Rate Range (THRR);Understanding of Exercise Prescription Increase Physical Activity;Increase Strength and Stamina;Able to understand and use rate of perceived exertion (RPE) scale;Able to understand and use Dyspnea scale;Knowledge and understanding of Target Heart Rate Range (THRR);Understanding of Exercise Prescription     Comments Berman has completed 4 exercise sessions and has tolerated well so far. He does have chronic hip pain that causes him to walk with a limp and he uses a cane for assistance. He has not complained of exercise making the pain any worse so far. He is able to do 15 minutes on the Nustep and 15 minutes on the arm ergometer without taking any rest breaks. In the short amount of time that he has been in the program he has already progressed with workload and MET level increases. He is exercising at 2.0 METS on the Nustep and 42 RPM's on the arm ergometer. Will continue to monitor and progress as he is able. Traivon has completed 10 exercise sessions and has been consistent with workload and MET level increases. His attendance has been good and it seems like he is enjoying the exercise. We have discussed home exercise and he has started exercising at home at least 2 days a week. He is exercising at 2.3 METS on the Nustep and 44 RPM's on Level 3.5 on the Arm Ergometer. Will continue to monitor and progress as he is able. Bassem has completed 15 exercise sessions. He has increased his workload on the arm ergometer but not the Nustep. He increased his METs on the Nustep. Jex averages 2.8 METs on the Nustep and 43 RPM at level 4 on the arm ergometer. He graduates today as I feel confident that he will do well with his home exercise.     Expected Outcomes Through exercise at rehab and home, the patient will  decrease shortness of breath with daily activities and feel confident in carrying out an exercise regimn at home. Through exercise at rehab and home, the patient will decrease shortness of breath with daily activities and feel confident in carrying out an exercise regimn at home. Through exercise at rehab and home, the patient will decrease shortness of breath with daily activities and feel confident in carrying out an exercise regimn at home.              Nutrition & Weight - Outcomes:  Pre Biometrics - 10/05/20 1515       Pre Biometrics   Grip Strength 37 kg             Post Biometrics - 12/18/20 1538        Post  Biometrics   Grip Strength 33 kg             Nutrition:  Nutrition Therapy & Goals - 10/17/20 0808       Nutrition Therapy   Diet General Healthful    Drug/Food Interactions Statins/Certain Fruits      Personal Nutrition Goals   Nutrition Goal Pt to identify food quantities necessary to achieve weight loss of 6-24 lb at graduation from cardiac rehab.    Personal Goal #2 Pt to build a healthy plate including vegetables, fruits, whole grains, and low-fat dairy products in a heart healthy meal plan.    Personal Goal #3 Pt to learn how to incorporate more nutrient dense foods into diet by tracking foods and reading food labels to understand foods he is eating      Intervention Plan   Intervention Prescribe, educate and counsel regarding individualized specific dietary modifications aiming towards targeted core components such as weight, hypertension, lipid management, diabetes, heart failure and other comorbidities.;Nutrition handout(s) given to patient.    Expected Outcomes Long Term Goal: Adherence to prescribed nutrition plan.;Short Term Goal: A plan has been developed with personal nutrition goals set during dietitian appointment.             Nutrition Discharge:   Education Questionnaire Score:  Knowledge Questionnaire Score - 12/18/20 1539        Knowledge Questionnaire Score   Post Score 17/18             Goals reviewed with patient; copy given to patient.

## 2021-01-16 ENCOUNTER — Ambulatory Visit: Payer: BC Managed Care – PPO | Admitting: Internal Medicine

## 2021-01-16 ENCOUNTER — Encounter: Payer: Self-pay | Admitting: Internal Medicine

## 2021-01-16 ENCOUNTER — Other Ambulatory Visit: Payer: Self-pay

## 2021-01-16 VITALS — BP 120/80 | HR 90 | Ht 71.0 in | Wt 261.8 lb

## 2021-01-16 DIAGNOSIS — Z23 Encounter for immunization: Secondary | ICD-10-CM | POA: Diagnosis not present

## 2021-01-16 DIAGNOSIS — U099 Post covid-19 condition, unspecified: Secondary | ICD-10-CM

## 2021-01-16 DIAGNOSIS — E669 Obesity, unspecified: Secondary | ICD-10-CM

## 2021-01-16 DIAGNOSIS — R0609 Other forms of dyspnea: Secondary | ICD-10-CM

## 2021-01-16 NOTE — Patient Instructions (Addendum)
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 Jetty Duhamel -Extend handicap placard permanently - covid mrna vaccination recommended for new variant strain - - flu shot 01/16/2021   Followup  - December 2022 with Dr. Marchelle Gearing; any further extension of disability will involve objective testing -Please continue to be motivated to lose weight and get physically fit

## 2021-01-16 NOTE — Progress Notes (Signed)
OV 11/02/2020  Subjective:  Patient ID: Darren Wood, male , DOB: 07/01/1960 , age 60 y.o. , MRN: [id number] , ADDRESS: [address] [place] [address] PCP Loyal Jacobson, MD Patient Care Team: Loyal Jacobson, MD as PCP - General (Family Medicine)  This Provider for this visit: Treatment Team:  Attending Provider: Kalman Shan, MD    11/02/2020 -   Chief Complaint  Patient presents with   Consult    Pt is being referred by Angus Seller, NP due to SOB from covid.  Pt states he had Covid Oct 2021 and states he is still having problems with SOB that happens with activities.     HPI Darren Wood 60 y.o. -history is gained from talking to the patient and also review of the records.  He has a history of hypertension, obesity class II, dyslipidemia and DJD.  He tested positive for COVID on 02/07/2020.  He was admitted between 02/13/2020 through 02/26/2020 with COVID-19 at the [facility] health system.  He had an abnormal chest x-ray that is documented below.  He was treated with oxygen, remdesivir, steroids and tocilizumab.  He also got a short course of antibiotics for concern for aspiration pneumonia.  He also got nystatin for thrush.  He was discharged on oxygen.   In January 2022 he had CT scan of the chest at [facility] that showed some bronchiectasis.  Elevation of right diaphragm also noted.  His ANA was negative./Borderline positive.  It is unclear to me if a sniff test was done.  In May 2022 he tried to return back to work where he works doing [employer] but he could not because of the physicality of the work.  He is also complaining of brain fog.  Review of the outside records indicat that he saw Dr. Burna Cash [facility] pulmonary fellow as recently as May 2022.  His assessment and plan as listed below.  1. Post-COVID syndrome  2. Restrictive lung disease  3. Elevated hemidiaphragm  4. Chronic respiratory failure with hypoxia (HCC)   #1 post COVID  syndrome Resume use of Flovent 110 inhaler twice daily as directed; I reminded him that he has refills available to him through 04/24/2021 Continue use of albuterol inhaler as needed for chest congestion, chest tightness or shortness of breath Referral to long COVID program at [facility] with Dr. Nolene Bernheim [phone]; [facility] [address] [place], [place] [address]  2. Restrictive lung disease 3. Elevated hemidiaphragm PFT 04/30/2020 ratio 91% FEV1 59% FVC 50% DLCO 51% Right hemidiaphragm noted and is contributing to overall dyspnea on exertion  4. Chronic respiratory failure 6-minute walk test conducted today again shows oxygen desaturation on exertion with need for 2 L/min supplemental oxygen Continue use of supplemental oxygen to maintain pulse ox readings between 88% and 93% The referral for pulmonary rehab was placed 6 to 8 weeks ago. He has not yet gotten a place in the pulmonary rehab program. He will start the pulmonary rehab as soon as he is notified.  Short-term disability form completed to have him out of work 09/21/2020 - 01/03/2021  On 10/04/2020 he visited with Angus Seller, COVID nurse practitioner.  At this time he expressed symptoms of dyspnea with exertion, myalgias, arthralgias, memory loss.  This is despite Flovent and doing pulm rehabilitation.  It was noticed that he was still requiring 2 L with exertion.  He denied any fever chills.  Denied any nausea vomiting diarrhea.  Denied any hemoptysis.  Denied chest pain or edema.  Therefore pulmonary referral was made.  He then started pulmonary rehabilitation on 10/09/2020, he presents to the pulmonary clinic on 11/02/2020 for new consult evaluation.  He lives in [place] he and his wife state that [place] is closer.  He is happy to continue and establish with the follow-up here.  At some point he will need his disability refill.  At this point in time he is complaining of significant dyspnea on exertion along with  cognitive processing issues post-COVID.  In talking to him his Epworth sleepiness score is 10.  His wife says he snores a lot and does have apneic spells at night.  Never been tested or diagnosed with sleep apnea.  No known heart issues.  Overall course is 1 of improvement but he still feels he is 20-30% you need to make in order to feel fully optimal.  At home he uses oxygen with exertion but not at rest or walking room to room.  He was surprised that in a walking desaturation test today he did not desaturate to the point he needs oxygen.  At rehab he is using 2 L.     CXR Oct 2021  Narrative & Impression  CLINICAL DATA:  Hypoxia.  COVID positive   EXAM: PORTABLE CHEST 1 VIEW   COMPARISON:  02/13/2020   FINDINGS: Stable mild bilateral infiltrate and low lung volumes. No visible effusion or pneumothorax. Stable heart size.   IMPRESSION: Stable low volume chest with mild infiltrates.     Electronically Signed   By: Monte Fantasia M.D.   On: 02/20/2020 07:57     Jan 2022 CT at [facility]:  There is tubular bronchiectasis throughout, worst in the bilateral  lower lobes, with peripheral irregular and ground-glass airspace  opacity, including bandlike elements with subpleural sparing. This  appearance is consistent with chronic sequelae of organizing  pneumonia and particularly is in keeping with chronic post  infectious scarring related to prior COVID 19 airspace disease.    Electronically Signed    By: Eddie Candle M.D.    On: 05/16/2020 13:05   has a past medical history of Hypertension.   reports that he has never smoked. He has never used smokeless tobacco.   12/19/20- 60 yoM never smoker for sleep evaluation courtesy of Dr Renette Butters 11/02/20) - Dr Annamaria Boots Sleep Medical problem list includes Bronchiectasis, HTN, Covid pneumonia, June 2022, Chronic Respiratory Failure with Hypoxia, Elevated Hemidiaphragm, Hypercholesterolemia, Obesity, O2 2L used only for  exertion Epworth score-4 Body weight today-262 lbs Covid vax- Complains of snoring, tiredd, waking often, tossing and turning. No ENT surgery. No lung problems prior to Covid infection. No heart probs. No parasomnias. Says lost 40 lbs related to Covid infection.  CT chest 11/29/20- IMPRESSION: 1. Pulmonary parenchymal pattern of peripheral and basilar predominant coarsened ground-glass and bronchiectasis/bronchiolectasis, minimally improved from 05/16/2020 and in keeping with sequelae of COVID-19 pneumonia. 2. Hepatic steatosis. 3. Enlarged pulmonary arteries, indicative of pulmonary arterial hypertension.    OV 01/16/2021  Subjective:  Patient ID: Darren Wood, male , DOB: July 02, 1960 , age 6 y.o. , MRN: [id number] , ADDRESS: [address] [place] [address] PCP Jefm Petty, MD Patient Care Team: Jefm Petty, MD as PCP - General (Family Medicine)  This Provider for this visit: Treatment Team:  Attending Provider: Brand Males, MD    01/16/2021 -   Chief Complaint  Patient presents with   Follow-up  Pt states he has been doing okay since last visit. States he still becomes winded when he exerts himself.   COVID long-haul follow-up  HPI Jagjit Riner 59 y.o. -presents for follow-up.  He presents with his wife.  Since last seeing me he has had his echocardiogram that probably shows diastolic dysfunction.  He had a high-resolution CT scan that shows minimal ILD changes.  He has attended pulmonary rehabilitation.  He feels this improved him.  He has seen Dr. Maple Hudson and outpatient home sleep study is pending.  He says that since his COVID last year he continues to improve.  Every few month interval he is continue to improve.  In fact his ILD symptom score show improvement.  However he feels he is no longer able to do physical work.  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  In the past he did physical work.  Before he got COVID he was completely fine.  He was unvaccinated and had to  be hospitalized.  He feels his brain fog is also improved with a still brain fog.  He is awaiting Social Security disability and long-term disability through his employer.  He wants to be written out till end of the year which apparently already have.  He wants to come back in December 2022 to see if he is better off his disability needs to be extended.  I have emphasized to him that much is unknown about COVID long-haul but he does have to motivate himself to lose weight and get physically fit.  He is agreed to be referred for a weight loss program.     SYMPTOM SCALE - ILD 11/02/2020  01/16/2021   O2 use ra ra  Shortness of Breath 0 -> 5 scale with 5 being worst (score 6 If unable to do)   At rest 0 0  Simple tasks - showers, clothes change, eating, shaving 2 1  Household (dishes, doing bed, laundry) 2 1  Shopping 4 2  Walking level at own pace 3 2  Walking up Stairs 3 2  Total (30-36) Dyspnea Score 14 8  How bad is your cough? 0 0  How bad is your fatigue 3 3  How bad is nausea 0 2  How bad is vomiting?  0 1  How bad is diarrhea? 2 0-  How bad is anxiety? 1 2  How bad is depression 1 0  Brain fog 3` 1        Simple office walk 185 feet x  3 laps goal with forehead probe 11/02/2020 - slow pace with cane  01/16/2021   O2 used Ra (has o2 at  home since covid oct 2021 but this walk is RA)   Number laps completed 3  3  Comments about pace Slow with cane Slow pace with cane  Resting Pulse Ox/HR 100% and 94/min 100% and 90  Final Pulse Ox/HR 94% and 132/min 96% and 126/,on  Desaturated </= 88% no no  Desaturated <= 3% points Yes, 6 potnt Ues, 4 ppt  Got Tachycardic >/= 90/min yes yes  Symptoms at end of test Moderate dyspnea Moderate dyspnea  Miscellaneous comments x      PFT  PFT Results Latest Ref Rng & Units 12/05/2020  FVC-Pre L 3.01  FVC-Predicted Pre % 72  FVC-Post L 2.99  FVC-Predicted Post % 72  Pre FEV1/FVC % % 90  Post FEV1/FCV % % 90  FEV1-Pre L 2.71   FEV1-Predicted Pre % 84  FEV1-Post L 2.69  DLCO uncorrected ml/min/mmHg 15.70  DLCO UNC% % 55  DLCO corrected ml/min/mmHg 16.02  DLCO COR %Predicted % 56  DLVA Predicted % 93   IMPRESSIONS     1. Left ventricular ejection fraction, by estimation, is 60 to 65%. The  left ventricle has normal function. The left ventricle has no regional  wall motion abnormalities- though technically difficult study. There is  moderate concentric left ventricular  hypertrophy. Left ventricular diastolic parameters are consistent with  Grade I diastolic dysfunction (impaired relaxation).   2. Right ventricular systolic function is normal. The right ventricular  size is normal. Tricuspid regurgitation signal is inadequate for assessing  PA pressure.   3. Left atrial size was mildly dilated.   4. The mitral valve is normal in structure. No evidence of mitral valve  regurgitation. No evidence of mitral stenosis.   5. The aortic valve is tricuspid. Aortic valve regurgitation is not  visualized. No aortic stenosis is present.   6. Aortic dilatation noted. There is mild dilatation of the ascending  aorta, measuring 40 mm.   Comparison(s): No prior Echocardiogram. Consider use of echo-contrast in  future studies.     has a past medical history of Hypertension.   reports that he has never smoked. He has never used smokeless tobacco.  Past Surgical History:  Procedure Laterality Date   REVISION TOTAL HIP ARTHROPLASTY      No Known Allergies  Immunization History  Administered Date(s) Administered   Influenza,inj,Quad PF,6-35 Mos 04/21/2017   Pneumococcal Polysaccharide-23 11/22/2020   Tdap 06/05/2014    No family history on file.   Current Outpatient Medications:    acetaminophen (TYLENOL) 500 MG tablet, Take 500 mg by mouth every 6 (six) hours as needed for fever., Disp: , Rfl:    atorvastatin (LIPITOR) 10 MG tablet, Take 10 mg by mouth daily., Disp: , Rfl:     losartan-hydrochlorothiazide (HYZAAR) 100-12.5 MG tablet, Take 1 tablet by mouth daily., Disp: , Rfl:       Objective:   Vitals:   01/16/21 1530  BP: 120/80  Pulse: 90  SpO2: 100%  Weight: 261 lb 12.8 oz (118.8 kg)  Height: 5\' 11"  (1.803 m)    Estimated body mass index is 36.51 kg/m as calculated from the following:   Height as of this encounter: 5\' 11"  (1.803 m).   Weight as of this encounter: 261 lb 12.8 oz (118.8 kg).  @WEIGHTCHANGE @    01/16/21 1530  Weight: 261 lb 12.8 oz (118.8 kg)     Physical Exam  General: No distress. obese Neuro: Alert and Oriented x 3. GCS 15. Speech normal Psych: Pleasant Resp:  Barrel Chest - no.  Wheeze - no, Crackles - no, No overt respiratory distress CVS: Normal heart sounds. Murmurs - mp Ext: Stigmata of Connective Tissue Disease - no HEENT: Normal upper airway. PEERL +. No post nasal drip        Assessment:       ICD-10-CM   1. COVID-19 long hauler manifesting chronic dyspnea  R06.09    U09.9          Plan:     Patient Instructions  History of 2019 novel coronavirus disease (COVID-19) Post-COVID-19 syndrome manifesting as chronic dyspnea Abnormal CT of the chest Physical deconditioning Obesityy   - covid long haul continues to improve -But still having some shortness of breath and physical deconditioning and mild brain fog -Thinks contributing to the symptoms obesity, stiff heart muscle and possible sleep apnea for  which work-up is under progress -Glad he completed pulmonary rehabilitation -Understood that long-term disability and Social Security disability applications are in progress   Plan - refer dR Nanine Means for nutritional related weight loss -Continue physical exercise -Sleep study evaluation progress through Dr. Jetty Duhamel -Extend handicap placard permanently - covid mrna vaccination recommended for new variant strain - - flu shot 01/16/2021   Followup  - December 2022 with  Dr. Marchelle Gearing; any further extension of disability will involve objective testing -Please continue to be motivated to lose weight and get physically fit    SIGNATURE    Dr. Kalman Shan, M.D., F.C.C.P,  Pulmonary and Critical Care Medicine Staff Physician, [facility] Center Director - Interstitial Lung Disease  Program  Pulmonary Fibrosis [facility] at [facility] [place], [place], [address]  Pager: [phone], If no answer or between  15:00h - 7:00h: call [phone] Telephone: [phone]  4:30 PM 01/16/2021

## 2021-01-28 NOTE — Telephone Encounter (Signed)
Darilyn can we sign off of this phone note?  Thanks

## 2021-01-29 NOTE — Telephone Encounter (Signed)
Yes, this phone note can been completed

## 2021-02-21 ENCOUNTER — Other Ambulatory Visit: Payer: Self-pay

## 2021-02-21 ENCOUNTER — Ambulatory Visit: Payer: BC Managed Care – PPO

## 2021-02-21 DIAGNOSIS — R0683 Snoring: Secondary | ICD-10-CM

## 2021-02-21 DIAGNOSIS — G4733 Obstructive sleep apnea (adult) (pediatric): Secondary | ICD-10-CM | POA: Diagnosis not present

## 2021-03-01 DIAGNOSIS — G4733 Obstructive sleep apnea (adult) (pediatric): Secondary | ICD-10-CM | POA: Diagnosis not present

## 2021-03-04 ENCOUNTER — Telehealth: Payer: Self-pay | Admitting: Internal Medicine

## 2021-03-06 NOTE — Telephone Encounter (Signed)
[facility] LMTCB, form has been filled out and placed in Dr. Jane Canary cabinet. He is not here until Friday so form will be completed then.

## 2021-03-08 NOTE — Telephone Encounter (Signed)
Called and spoke with patient wife to let her know that form has been put in mail and that there are some blank slots that need to be filled in before turning it in. She expressed understanding. Nothing further needed at this time.

## 2021-03-08 NOTE — Telephone Encounter (Signed)
MR please advise once this has been done and we can mail it to the pt. thanks

## 2021-03-10 ENCOUNTER — Encounter (HOSPITAL_BASED_OUTPATIENT_CLINIC_OR_DEPARTMENT_OTHER): Payer: Self-pay | Admitting: Emergency Medicine

## 2021-03-10 ENCOUNTER — Emergency Department (HOSPITAL_BASED_OUTPATIENT_CLINIC_OR_DEPARTMENT_OTHER)
Admission: EM | Admit: 2021-03-10 | Discharge: 2021-03-10 | Disposition: A | Discharge status: Home / Self Care | Payer: BC Managed Care – PPO | Attending: Emergency Medicine | Admitting: Emergency Medicine

## 2021-03-10 ENCOUNTER — Other Ambulatory Visit: Payer: Self-pay

## 2021-03-10 DIAGNOSIS — Z8616 Personal history of COVID-19: Secondary | ICD-10-CM | POA: Insufficient documentation

## 2021-03-10 DIAGNOSIS — Z79899 Other long term (current) drug therapy: Secondary | ICD-10-CM | POA: Insufficient documentation

## 2021-03-10 DIAGNOSIS — I10 Essential (primary) hypertension: Secondary | ICD-10-CM | POA: Diagnosis not present

## 2021-03-10 DIAGNOSIS — Z96642 Presence of left artificial hip joint: Secondary | ICD-10-CM | POA: Insufficient documentation

## 2021-03-10 DIAGNOSIS — T148XXA Other injury of unspecified body region, initial encounter: Secondary | ICD-10-CM

## 2021-03-10 DIAGNOSIS — M25552 Pain in left hip: Secondary | ICD-10-CM | POA: Diagnosis present

## 2021-03-10 DIAGNOSIS — X501XXA Overexertion from prolonged static or awkward postures, initial encounter: Secondary | ICD-10-CM | POA: Diagnosis not present

## 2021-03-10 HISTORY — DX: Hyperlipidemia, unspecified: E78.5

## 2021-03-10 MED ORDER — DICLOFENAC SODIUM 1 % EX GEL: 2.0000 g | Freq: Four times a day (QID) | CUTANEOUS | 0 refills | Status: AC

## 2021-03-10 MED ORDER — LIDOCAINE 5 % EX PTCH: 1.0000 | MEDICATED_PATCH | CUTANEOUS | 0 refills | Status: DC

## 2021-03-10 MED ORDER — METHOCARBAMOL 500 MG PO TABS: 500.0000 mg | ORAL_TABLET | Freq: Two times a day (BID) | ORAL | 0 refills | Status: DC

## 2021-03-10 NOTE — ED Triage Notes (Signed)
Pt arrives pov with c/o Left hip pain that radiates to left leg. Pt denies injury, reports bending over to pick up something x 1 week pta

## 2021-03-10 NOTE — Discharge Instructions (Addendum)
You were seen in the emergency department today for hip pain.  As we discussed I think your symptoms are related to a muscle strain.  You did not have any of the red flag back pain symptoms that we think of.  Said like to treat you with a muscle relaxer, and some topical pain relievers.  You can continue taking ibuprofen for pain as well.  With the muscle relaxer, please do not drink alcohol with taking this medication or driving until you know how it makes you feel.    If your symptoms persist despite this medicine, I like you to follow-up with your regular orthopedic doctor to discuss further management.  Symptoms are like you to watch out for include numbness or tingling in your legs or your groin, inability to urinate, or incontinence of your urine or your bowels.  It has been a pleasure seeing and caring for you today and I hope you start feeling better soon!

## 2021-03-10 NOTE — ED Provider Notes (Signed)
[facility] EMERGENCY DEPARTMENT Provider Note   CSN: [id number] Arrival date & time: 03/10/21  0355     History Chief Complaint  Patient presents with   Hip Pain    Darren Wood is a 60 y.o. male who presents with left hip pain x1 week.  Patient states that he had bent over to pick something up in his bathroom, and felt a twinge in his lower back.  There is no other trauma noted.  Pain has persisted since, and has been taking ibuprofen with some relief.  He states his pain is made worse with any movement and intermittently radiates into his left lateral leg.  History of left hip replacement in 2015, and patient continues to follow-up with orthopedics for potential hip replacement of the right hip.    Hip Pain      Past Medical History:  Diagnosis Date   Hyperlipidemia    Hypertension     Patient Active Problem List   Diagnosis Date Noted   Snoring 12/23/2020   History of COVID-19 10/04/2020   Memory loss 10/04/2020   Chronic respiratory failure with hypoxia (HCC) 10/04/2020   Pneumonia due to COVID-19 virus 02/13/2020   Acute respiratory failure with hypoxia (HCC) 02/13/2020   Obesity (BMI 30-39.9) 11/18/2017   Pure hypercholesterolemia 05/19/2017   Essential hypertension 04/21/2017   History of total hip arthroplasty, left 10/20/2013   Osteoarthritis of hip 06/14/2013    Past Surgical History:  Procedure Laterality Date   REVISION TOTAL HIP ARTHROPLASTY         History reviewed. No pertinent family history.  Social History   Tobacco Use   Smoking status: Never   Smokeless tobacco: Never  Substance Use Topics   Alcohol use: Never   Drug use: Never    Home Medications Prior to Admission medications   Medication Sig Start Date End Date Taking? Authorizing Provider  diclofenac Sodium (VOLTAREN) 1 % GEL Apply 2 g topically 4 (four) times daily. 03/10/21  Yes Sarrinah Gardin T, PA-C  lidocaine (LIDODERM) 5 % Place 1 patch onto the skin daily.  Remove & Discard patch within 12 hours or as directed by MD 03/10/21  Yes Analysia Dungee T, PA-C  methocarbamol (ROBAXIN) 500 MG tablet Take 1 tablet (500 mg total) by mouth 2 (two) times daily. 03/10/21  Yes Kadesia Robel T, PA-C  acetaminophen (TYLENOL) 500 MG tablet Take 500 mg by mouth every 6 (six) hours as needed for fever.    [provider]  atorvastatin (LIPITOR) 10 MG tablet Take 10 mg by mouth daily.    [provider]  losartan-hydrochlorothiazide (HYZAAR) 100-12.5 MG tablet Take 1 tablet by mouth daily. 01/31/20   [provider]    Allergies    Patient has no known allergies.  Review of Systems   Review of Systems  Constitutional:  Negative for chills and fever.  Genitourinary:  Negative for decreased urine volume, difficulty urinating and urgency.  Musculoskeletal:  Positive for back pain. Negative for gait problem, joint swelling and neck pain.       Left hip pain  Neurological:  Negative for tremors, weakness, light-headedness and numbness.  All other systems reviewed and are negative.  Physical Exam Updated Vital Signs BP (!) 135/96 (BP Location: Right Arm)   Pulse 94   Temp 98.4 F (36.9 C) (Oral)   Resp 18   Ht 5\' 11"  (1.803 m)   Wt 117.9 kg   SpO2 97%   BMI 36.26 kg/m  Physical Exam Vitals and nursing note reviewed.  Constitutional:      Appearance: Normal appearance.  HENT:     Head: Normocephalic and atraumatic.  Eyes:     Conjunctiva/sclera: Conjunctivae normal.  Pulmonary:     Effort: Pulmonary effort is normal. No respiratory distress.  Musculoskeletal:     Comments: 5/5 strength of bilateral hips, knees, and ankles.  Pulses equal and normal in BLE.  Sensation intact bilaterally.  No tenderness to palpation over midline lumbar spine, step-offs or crepitus.  No tenderness to palpation over the entirety of the left hip joint.  Skin:    General: Skin is warm and dry.  Neurological:     Mental Status: He is alert.   Psychiatric:        Mood and Affect: Mood normal.        Behavior: Behavior normal.    ED Results / Procedures / Treatments   Labs (all labs ordered are listed, but only abnormal results are displayed) Labs Reviewed - No data to display  EKG None  Radiology No results found.  Procedures Procedures   Medications Ordered in ED Medications - No data to display  ED Course  I have reviewed the triage vital signs and the nursing notes.  Pertinent labs & imaging results that were available during my care of the patient were reviewed by me and considered in my medical decision making (see chart for details).    MDM Rules/Calculators/A&P                           Patient is 60 year old male who presents emergency department with 1 week of left hip pain.  Patient states that he had bent over to pick something up off the bathroom floor, noticed a twinge in his left lower back with pain radiating to his left hip and his left lateral leg.  No fevers, chills, numbness, tingling, saddle anesthesia, urinary retention or incontinence to suggest cauda equina or myelopathy.  On exam patient has full passive and active range of motion of bilateral lower extremities, he is neurovascularly intact.  No reproducible pain on exam.  No midline spinal tenderness, step-offs or crepitus.  Pain most likely related to muscle strain.    Plan to treat with muscle relaxers and topical pain relievers, as well as continuing previous over-the-counter medications.  Patient does not have any red flag symptoms to make me concerned for myelopathy.  Discussed that imaging of his left hip would not be very beneficial today, as I have low suspicion for any fractures or dislocations.  Patient is to follow-up with his regular orthopedic doctor in the next week or so if his symptoms persist, to discuss long-term management.  He is not requiring admission or inpatient treatment for symptoms at this time.  Discussed reasons to  return to the emergency department.  Patient agreeable to plan.  Final Clinical Impression(s) / ED Diagnoses Final diagnoses:  Left hip pain  Muscle strain    Rx / DC Orders ED Discharge Orders          Ordered    methocarbamol (ROBAXIN) 500 MG tablet  2 times daily        03/10/21 1118    diclofenac Sodium (VOLTAREN) 1 % GEL  4 times daily        03/10/21 1118    lidocaine (LIDODERM) 5 %  Every 24 hours        03/10/21 1118  Jeanella Flattery 03/10/21 1126    Milagros Loll, MD 03/12/21 1108

## 2021-03-12 ENCOUNTER — Telehealth: Payer: Self-pay | Admitting: Internal Medicine

## 2021-03-12 DIAGNOSIS — G4733 Obstructive sleep apnea (adult) (pediatric): Secondary | ICD-10-CM

## 2021-03-12 NOTE — Telephone Encounter (Signed)
CY please advise on the results of the HST.  thanks

## 2021-03-12 NOTE — Telephone Encounter (Signed)
Sleep study showed severe sleep apnea, averaging 44 apneas/ hour, with drops in blood oxygen level. Recommend we Order CPAP- he has O2 so use same DME if he is agreeable. Auto 5-20, mask of choice, humidifier, supplies, AirView/ card.  He will need to see me 31-90 days after getting his machine, per insurance regulations.

## 2021-03-13 NOTE — Telephone Encounter (Signed)
Attempted to call patient, LVM regards of there HST results. Will try again later.

## 2021-03-13 NOTE — Telephone Encounter (Signed)
Called and spoke to patient about HST results, patient had a clear understanding Per Dr Maple Hudson, he would like patient to have CPAP through [facility] with the pressure of 5-20. Order placed. Nothing further needed

## 2021-03-19 ENCOUNTER — Telehealth: Payer: Self-pay | Admitting: Internal Medicine

## 2021-03-19 NOTE — Telephone Encounter (Signed)
Disability forms received via fax - emailed to [facility] for review.

## 2021-04-04 NOTE — Telephone Encounter (Signed)
Disability form sent to Dr. Marchelle Gearing for siganture.  I spoke to pt's wife today and patient has been approved for social security disability.  He will convert to Medicare in 2 years.  This disability form will allow his employer to continue to pay his benefits until he moves to Medicare.

## 2021-04-11 ENCOUNTER — Telehealth: Payer: Self-pay | Admitting: Internal Medicine

## 2021-04-11 ENCOUNTER — Other Ambulatory Visit: Payer: Self-pay

## 2021-04-11 ENCOUNTER — Encounter: Payer: Self-pay | Admitting: Internal Medicine

## 2021-04-11 ENCOUNTER — Ambulatory Visit: Payer: BC Managed Care – PPO | Admitting: Internal Medicine

## 2021-04-11 VITALS — BP 146/84 | HR 85 | Ht 71.0 in | Wt 260.0 lb

## 2021-04-11 DIAGNOSIS — R0609 Other forms of dyspnea: Secondary | ICD-10-CM | POA: Diagnosis not present

## 2021-04-11 DIAGNOSIS — U099 Post covid-19 condition, unspecified: Secondary | ICD-10-CM

## 2021-04-11 DIAGNOSIS — G4733 Obstructive sleep apnea (adult) (pediatric): Secondary | ICD-10-CM | POA: Diagnosis not present

## 2021-04-11 DIAGNOSIS — E669 Obesity, unspecified: Secondary | ICD-10-CM | POA: Diagnosis not present

## 2021-04-11 DIAGNOSIS — R5381 Other malaise: Secondary | ICD-10-CM

## 2021-04-11 DIAGNOSIS — Z01811 Encounter for preprocedural respiratory examination: Secondary | ICD-10-CM

## 2021-04-11 DIAGNOSIS — M169 Osteoarthritis of hip, unspecified: Secondary | ICD-10-CM

## 2021-04-11 NOTE — Progress Notes (Signed)
OV 11/02/2020  Subjective:  Patient ID: Darren Wood, male , DOB: May 16, 1960 , age 60 y.o. , MRN: [id number] , ADDRESS: [address] [place] [address] PCP Jefm Petty, MD Patient Care Team: Jefm Petty, MD as PCP - General (Family Medicine)  This Provider for this visit: Treatment Team:  Attending Provider: Brand Males, MD    11/02/2020 -   Chief Complaint  Patient presents with   Consult    Pt is being referred by Lazaro Arms, NP due to SOB from covid.  Pt states he had Covid Oct 2021 and states he is still having problems with SOB that happens with activities.     HPI Darren Wood 61 y.o. -history is gained from talking to the patient and also review of the records.  He has a history of hypertension, obesity class II, dyslipidemia and DJD.  He tested positive for COVID on 02/07/2020.  He was admitted between 02/13/2020 through 02/26/2020 with COVID-19 at the [facility] health system.  He had an abnormal chest x-ray that is documented below.  He was treated with oxygen, remdesivir, steroids and tocilizumab.  He also got a short course of antibiotics for concern for aspiration pneumonia.  He also got nystatin for thrush.  He was discharged on oxygen.   In January 2022 he had CT scan of the chest at [facility] that showed some bronchiectasis.  Elevation of right diaphragm also noted.  His ANA was negative./Borderline positive.  It is unclear to me if a sniff test was done.  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  He is also complaining of brain fog.  Review of the outside records indicat that he saw Dr. Esaw Dace [facility] pulmonary fellow as recently as May 2022.  His assessment and plan as listed below.  1. Post-COVID syndrome  2. Restrictive lung disease  3. Elevated hemidiaphragm  4. Chronic respiratory failure with hypoxia (HCC)   #1 post COVID  syndrome Resume use of Flovent 110 inhaler twice daily as directed; I reminded him that he has refills available to him through 04/24/2021 Continue use of albuterol inhaler as needed for chest congestion, chest tightness or shortness of breath Referral to long COVID program at [facility] with Dr. Ned Clines [phone]; [facility] [address], [place] [address]  2. Restrictive lung disease 3. Elevated hemidiaphragm PFT 04/30/2020 ratio 91% FEV1 59% FVC 50% DLCO 51% Right hemidiaphragm noted and is contributing to overall dyspnea on exertion  4. Chronic respiratory failure 6-minute walk test conducted today again shows oxygen desaturation on exertion with need for 2 L/min supplemental oxygen Continue use of supplemental oxygen to maintain pulse ox readings between 88% and 93% The referral for pulmonary rehab was placed 6 to 8 weeks ago. He has not yet gotten a place in the pulmonary rehab program. He will start the pulmonary rehab as soon as he is notified.  Short-term disability form completed to have him out of work 09/21/2020 - 01/03/2021  On 10/04/2020 he visited with Lazaro Arms, [place] nurse practitioner.  At this time he expressed symptoms of dyspnea with exertion, myalgias, arthralgias, memory loss.  This is despite Flovent and doing pulm rehabilitation.  It was noticed that he was still requiring 2 L with exertion.  He denied any fever chills.  Denied any nausea vomiting diarrhea.  Denied any hemoptysis.  Denied chest pain or edema.  Therefore pulmonary referral was made.  He then started pulmonary rehabilitation on 10/09/2020, he presents to the pulmonary clinic on 11/02/2020 for new consult evaluation.  He lives in [place] he and his wife state that [place] is closer.  He is happy to continue and establish with the follow-up here.  At some point he will need his disability refill.  At this point in time he is complaining of significant dyspnea on exertion along with  cognitive processing issues post-COVID.  In talking to him his Epworth sleepiness score is 10.  His wife says he snores a lot and does have apneic spells at night.  Never been tested or diagnosed with sleep apnea.  No known heart issues.  Overall course is 1 of improvement but he still feels he is 20-30% you need to make in order to feel fully optimal.  At home he uses oxygen with exertion but not at rest or walking room to room.  He was surprised that in a walking desaturation test today he did not desaturate to the point he needs oxygen.  At rehab he is using 2 L.     CXR Oct 2021  Narrative & Impression  CLINICAL DATA:  Hypoxia.  COVID positive   EXAM: PORTABLE CHEST 1 VIEW   COMPARISON:  02/13/2020   FINDINGS: Stable mild bilateral infiltrate and low lung volumes. No visible effusion or pneumothorax. Stable heart size.   IMPRESSION: Stable low volume chest with mild infiltrates.     Electronically Signed   By: Monte Fantasia M.D.   On: 02/20/2020 07:57     Jan 2022 CT at [facility]:  There is tubular bronchiectasis throughout, worst in the bilateral  lower lobes, with peripheral irregular and ground-glass airspace  opacity, including bandlike elements with subpleural sparing. This  appearance is consistent with chronic sequelae of organizing  pneumonia and particularly is in keeping with chronic post  infectious scarring related to prior COVID 19 airspace disease.    Electronically Signed    By: Eddie Candle M.D.    On: 05/16/2020 13:05   has a past medical history of Hypertension.   reports that he has never smoked. He has never used smokeless tobacco.   12/19/20- 60 yoM never smoker for sleep evaluation courtesy of Dr Renette Butters 11/02/20) - Dr Annamaria Boots Sleep Medical problem list includes Bronchiectasis, HTN, Covid pneumonia, June 2022, Chronic Respiratory Failure with Hypoxia, Elevated Hemidiaphragm, Hypercholesterolemia, Obesity, O2 2L used only for  exertion Epworth score-4 Body weight today-262 lbs Covid vax- Complains of snoring, tiredd, waking often, tossing and turning. No ENT surgery. No lung problems prior to Covid infection. No heart probs. No parasomnias. Says lost 40 lbs related to Covid infection.  CT chest 11/29/20- IMPRESSION: 1. Pulmonary parenchymal pattern of peripheral and basilar predominant coarsened ground-glass and bronchiectasis/bronchiolectasis, minimally improved from 05/16/2020 and in keeping with sequelae of COVID-19 pneumonia. 2. Hepatic steatosis. 3. Enlarged pulmonary arteries, indicative of pulmonary arterial hypertension.    OV 01/16/2021  Subjective:  Patient ID: Darren Wood, male , DOB: July 02, 1960 , age 6 y.o. , MRN: [id number] , AD[address]ESS: [address] [place] [address] PCP Jefm Petty, MD Patient Care Team: Jefm Petty, MD as PCP - General (Family Medicine)  This Provider for this visit: Treatment Team:  Attending Provider: Brand Males, MD    01/16/2021 -   Chief Complaint  Patient presents with   Follow-up  Pt states he has been doing okay since last visit. States he still becomes winded when he exerts himself.   COVID long-haul follow-up  HPI Cindy Brindisi 60 y.o. -presents for follow-up.  He presents with his wife.  Since last seeing me he has had his echocardiogram that probably shows diastolic dysfunction.  He had a high-resolution CT scan that shows minimal ILD changes.  He has attended pulmonary rehabilitation.  He feels this improved him.  He has seen Dr. Maple Hudson and outpatient home sleep study is pending.  He says that since his COVID last year he continues to improve.  Every few month interval he is continue to improve.  In fact his ILD symptom score show improvement.  However he feels he is no longer able to do physical work.  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  In the past he did physical work.  Before he got COVID he was completely fine.  He was unvaccinated and had to  be hospitalized.  He feels his brain fog is also improved with a still brain fog.  He is awaiting Social Security disability and long-term disability through his employer.  He wants to be written out till end of the year which apparently already have.  He wants to come back in December 2022 to see if he is better off his disability needs to be extended.  I have emphasized to him that much is unknown about COVID long-haul but he does have to motivate himself to lose weight and get physically fit.  He is agreed to be referred for a weight loss program.       PFT  PFT Results Latest Ref Rng & Units 12/05/2020  FVC-Pre L 3.01  FVC-Predicted Pre % 72  FVC-Post L 2.99  FVC-Predicted Post % 72  Pre FEV1/FVC % % 90  Post FEV1/FCV % % 90  FEV1-Pre L 2.71  FEV1-Predicted Pre % 84  FEV1-Post L 2.69  DLCO uncorrected ml/min/mmHg 15.70  DLCO UNC% % 55  DLCO corrected ml/min/mmHg 16.02  DLCO COR %Predicted % 56  DLVA Predicted % 93   IMPRESSIONS     1. Left ventricular ejection fraction, by estimation, is 60 to 65%. The  left ventricle has normal function. The left ventricle has no regional  wall motion abnormalities- though technically difficult study. There is  moderate concentric left ventricular  hypertrophy. Left ventricular diastolic parameters are consistent with  Grade I diastolic dysfunction (impaired relaxation).   2. Right ventricular systolic function is normal. The right ventricular  size is normal. Tricuspid regurgitation signal is inadequate for assessing  PA pressure.   3. Left atrial size was mildly dilated.   4. The mitral valve is normal in structure. No evidence of mitral valve  regurgitation. No evidence of mitral stenosis.   5. The aortic valve is tricuspid. Aortic valve regurgitation is not  visualized. No aortic stenosis is present.   6. Aortic dilatation noted. There is mild dilatation of the ascending  aorta, measuring 40 mm.   Comparison(s): No prior  Echocardiogram. Consider use of echo-contrast in  future studies.    OV 04/11/2021  Subjective:  Patient ID: Aram Candela, male , DOB: [birth year] , age 69 y.o. , MRN: [id number] , ADDRESS: [address] [place] [address] PCP Loyal Jacobson, MD Patient Care Team: Loyal Jacobson, MD as PCP - General (Family Medicine)  This Provider for this visit: Treatment Team:  Attending Provider: Kalman Shan, MD    04/11/2021 -  Chief Complaint  Patient presents with   Follow-up    Surgery clearance   COVID long-haul follow-up  HPI Konstandinos Sculley 60 y.o. -returns for follow-up.  In the interim is been approved for Social Security disability.  His employer has approved him for long-term disability.  Today I signed the form for [facility] claims.  His long-haul physical rehab exercises have been limited now because of development of hip arthritis.  He wants a hip surgery.  He is here for preoperative clearance.  Wife also wants to know if he can take the COVID by Valent mRNA vaccine.  His COVID was over a year ago.  Was probably delta variant.  That was the dominant strain at that time.  He is also had a left-sided sciatic nerve pain flare.  In the interim his been diagnosed with sleep apnea.  He is going to start CPAP tonight.  He uses oxygen at night.     SYMPTOM SCALE - ILD 11/02/2020  01/16/2021   O2 use ra ra  Shortness of Breath 0 -> 5 scale with 5 being worst (score 6 If unable to do)   At rest 0 0  Simple tasks - showers, clothes change, eating, shaving 2 1  Household (dishes, doing bed, laundry) 2 1  Shopping 4 2  Walking level at own pace 3 2  Walking up Stairs 3 2  Total (30-36) Dyspnea Score 14 8  How bad is your cough? 0 0  How bad is your fatigue 3 3  How bad is nausea 0 2  How bad is vomiting?  0 1  How bad is diarrhea? 2 0-  How bad is anxiety? 1 2  How bad is depression 1 0  Brain fog 3` 1        Simple office walk 185 feet x  3 laps goal with forehead  probe 11/02/2020 - slow pace with cane  01/16/2021   O2 used Ra (has o2 at  home since covid oct 2021 but this walk is RA)   Number laps completed 3  3  Comments about pace Slow with cane Slow pace with cane  Resting Pulse Ox/HR 100% and 94/min 100% and 90  Final Pulse Ox/HR 94% and 132/min 96% and 126/,on  Desaturated </= 88% no no  Desaturated <= 3% points Yes, 6 potnt Ues, 4 ppt  Got Tachycardic >/= 90/min yes yes  Symptoms at end of test Moderate dyspnea Moderate dyspnea  Miscellaneous comments x    CT Chest data  No results found.    PFT  PFT Results Latest Ref Rng & Units 12/05/2020  FVC-Pre L 3.01  FVC-Predicted Pre % 72  FVC-Post L 2.99  FVC-Predicted Post % 72  Pre FEV1/FVC % % 90  Post FEV1/FCV % % 90  FEV1-Pre L 2.71  FEV1-Predicted Pre % 84  FEV1-Post L 2.69  DLCO uncorrected ml/min/mmHg 15.70  DLCO UNC% % 55  DLCO corrected ml/min/mmHg 16.02  DLCO COR %Predicted % 56  DLVA Predicted % 93       has a past medical history of Hyperlipidemia and Hypertension.   reports that he has never smoked. He has never used smokeless tobacco.  Past Surgical History:  Procedure Laterality Date   REVISION TOTAL HIP ARTHROPLASTY      No Known Allergies  Immunization History  Administered Date(s) Administered   Influenza,inj,Quad PF,6+ Mos 01/16/2021   Influenza,inj,Quad PF,6-35 Mos 04/21/2017   Pneumococcal Polysaccharide-23 11/22/2020   Tdap 06/05/2014  No family history on file.   Current Outpatient Medications:    acetaminophen (TYLENOL) 500 MG tablet, Take 500 mg by mouth every 6 (six) hours as needed for fever., Disp: , Rfl:    atorvastatin (LIPITOR) 10 MG tablet, Take 10 mg by mouth daily., Disp: , Rfl:    diclofenac Sodium (VOLTAREN) 1 % GEL, Apply 2 g topically 4 (four) times daily., Disp: 100 g, Rfl: 0   lidocaine (LIDODERM) 5 %, Place 1 patch onto the skin daily. Remove & Discard patch within 12 hours or as directed by MD, Disp: 30 patch, Rfl: 0    losartan-hydrochlorothiazide (HYZAAR) 100-12.5 MG tablet, Take 1 tablet by mouth daily., Disp: , Rfl:    methocarbamol (ROBAXIN) 500 MG tablet, Take 1 tablet (500 mg total) by mouth 2 (two) times daily., Disp: 20 tablet, Rfl: 0      Objective:   Vitals:   04/11/21 1345  BP: (!) 146/84  Pulse: 85  SpO2: 97%  Weight: 260 lb (117.9 kg)  Height: 5\' 11"  (1.803 m)    Estimated body mass index is 36.26 kg/m as calculated from the following:   Height as of this encounter: 5\' 11"  (1.803 m).   Weight as of this encounter: 260 lb (117.9 kg).  @WEIGHTCHANGE @  [employer]   04/11/21 1345  Weight: 260 lb (117.9 kg)     Physical Exam= General: No distress. Obese. Sitting in wheeel chari Neuro: Alert and Oriented x 3. GCS 15. Speech normal Psych: Pleasant Resp:  Barrel Chest - no.  Wheeze - no, Crackles - no, No overt respiratory distress CVS: Normal heart sounds. Murmurs - no Ext: Stigmata of Connective Tissue Disease - no HEENT: Normal upper airway. PEERL +. No post nasal drip        Assessment:       ICD-10-CM   1. COVID-19 long hauler manifesting chronic dyspnea  R06.09    U09.9     2. Obesity (BMI 30-39.9)  E66.9     3. OSA (obstructive sleep apnea)  G47.33     4. Preop respiratory exam  Z01.811          Plan:     Patient Instructions  History of 2019 novel coronavirus disease (COVID-19)- oct 2021 Post-COVID-19 syndrome manifesting as chronic dyspnea Abnormal CT of the chest Physical deconditioning Obesityy   - covid long haul continues to improve slowly but still prsent -Glad you are using oxygen at night and that is helping you -But still having some shortness of breath and physical deconditioning and mild brain fog -Thinks contributing to the symptoms obesity, stiff heart muscle and possible sleep apnea for which work-up is under progress -Glad you got approved for Social Security disability - Glad you got approved by [employer] for long-term  disability benefit from your employer  -I just signed the evaluation form  -Currently physical exercise being limited by right hip pain and noted that you need right hip surgery  Plan  - Physical rehab exercises after hip surgery  Sleep apnea  Diagnosis been made by Dr. Baird Lyons Glad you are on CPAP at night with  oxygen starting 04/11/2021   Plan - According to sleep specialist.  Right hip arthritis Preoperative respiratory evaluation  Low moderate risk for respiratory complications following right hip surgery but sleep apnea poses a risk along with weight and age  Plan  - Surgery while in the hospital and recovery in the hospital setting with CPAP  Vaccine counseling  Plan  -  COVID biValent dose mRNA vaccine    Followup  - 6 months or sooner if needed    SIGNATURE    Dr. Brand Males, M.D., F.C.C.P,  Pulmonary and Critical Care Medicine Staff Physician, [place] Director - Interstitial Lung Disease  Program  Pulmonary [place] [place], [place], [address]  Pager: [phone], If no answer or between  15:00h - 7:00h: call [phone] Telephone: [phone]  2:14 PM 04/11/2021

## 2021-04-11 NOTE — Patient Instructions (Addendum)
History of 2019 novel coronavirus disease (COVID-19)- oct 2021 Post-COVID-19 syndrome manifesting as chronic dyspnea Abnormal CT of the chest Physical deconditioning Obesityy   - covid long haul continues to improve slowly but still prsent -Glad you are using oxygen at night and that is helping you -But still having some shortness of breath and physical deconditioning and mild brain fog -Thinks contributing to the symptoms obesity, stiff heart muscle and possible sleep apnea for which work-up is under progress -Glad you got approved for Social Security disability - Glad you got approved by [employer] for long-term disability benefit from your employer  -I just signed the evaluation form  -Currently physical exercise being limited by right hip pain and noted that you need right hip surgery  Plan  - Physical rehab exercises after hip surgery  Sleep apnea  Diagnosis been made by Dr. Jetty Duhamel Glad you are on CPAP at night with  oxygen starting 04/11/2021   Plan - According to sleep specialist.  Right hip arthritis Preoperative respiratory evaluation  Low moderate risk for respiratory complications following right hip surgery but sleep apnea poses a risk along with weight and age  Plan  - Surgery while in the hospital and recovery in the hospital setting with CPAP  Vaccine counseling  Plan  - COVID biValent dose mRNA vaccine    Followup  - 6 months or sooner if needed COVID long-haul follow-up

## 2021-04-12 NOTE — Telephone Encounter (Signed)
Patient was in office 12/8 for follow-up with Dr. Marchelle Gearing.  MR signed the disability form and I faxed it today, with 12/8 OV notes to [facility], Attn:  Darnelle Catalan  fax# [phone].  Hard copy of signed form was provided to Mrs. Pollak while in office yesterday.

## 2021-04-12 NOTE — Telephone Encounter (Signed)
Please advise if ok to write a prescription for a walker.

## 2021-04-15 NOTE — Telephone Encounter (Signed)
Ok to do Rx for walker

## 2021-04-15 NOTE — Telephone Encounter (Signed)
Attempted to call pt's spouse Deidre but unable to reach. Left message for her to return call.

## 2021-04-18 NOTE — Telephone Encounter (Signed)
Called and spoke with pt's spouse Deidre letting her know that we were going to place the order for walker for pt and she verbalized understanding. Order placed. Nothing further needed.

## 2021-04-18 NOTE — Telephone Encounter (Signed)
Darren Wood wife is returning phone call. Darren Wood phone number is [phone].

## 2021-04-19 NOTE — Progress Notes (Deleted)
12/19/20- 60 yoM never smoker for sleep evaluation courtesy of Dr Meryle Ready 11/02/20) Medical problem list includes Bronchiectasis, HTN, Covid pneumonia, June 2022, Chronic Respiratory Failure with Hypoxia, Elevated Hemidiaphragm, Hypercholesterolemia, Obesity, O2 2L used only for exertion Epworth score-4 Body weight today-262 lbs Covid vax- Complains of snoring, tiredd, waking often, tossing and turning. No ENT surgery. No lung problems prior to Covid infection. No heart probs. No parasomnias. Says lost 40 lbs related to Covid infection.  CT chest 11/29/20- IMPRESSION: 1. Pulmonary parenchymal pattern of peripheral and basilar predominant coarsened ground-glass and bronchiectasis/bronchiolectasis, minimally improved from 05/16/2020 and in keeping with sequelae of COVID-19 pneumonia. 2. Hepatic steatosis. 3. Enlarged pulmonary arteries, indicative of pulmonary arterial hypertension.  04/22/21- 60 yoM never smoker followed for OSA, complicated by Bronchiectasis, HTN, Covid pneumonia, June 2022, Chronic Respiratory Failure with Hypoxia, Elevated Hemidiaphragm, Hypercholesterolemia, Obesity, O2 2L used only for exertion HST 02/21/21- AHI 44.3/ hr, desaturation to 70%, body weight 262 lbs CPAP auto 5/20/ [facility]   ordered 03/13/21 Download- Body weight today- Covid vax- Flu vax-  PFT (Dr Illene Silver) 12/05/20-severe restriction, moderate DLCO defect  ROS-see HPI   + = positive Constitutional:    weight loss, night sweats, fevers, chills, fatigue, lassitude. HEENT:    headaches, difficulty swallowing, +tooth/dental problems, sore throat,       +sneezing, itching, ear ache, nasal congestion, post nasal drip, snoring CV:    chest pain, orthopnea, PND, swelling in lower extremities, anasarca,                                   dizziness, palpitations Resp:   +shortness of breath with exertion or at rest.                productive cough,   non-productive cough, coughing up of blood.               change in color of mucus.  wheezing.   Skin:    rash or lesions. GI:  No-   heartburn, +indigestion, abdominal pain, nausea, vomiting, diarrhea,                 change in bowel habits, loss of appetite GU: dysuria, change in color of urine, no urgency or frequency.   flank pain. MS:   joint pain, +stiffness, decreased range of motion, back pain. Neuro-     nothing unusual Psych:  change in mood or affect.  depression or anxiety.   memory loss.   OBJ- Physical Exam General- Alert, Oriented, Affect-appropriate, Distress- none acute, + obese  Skin- rash-none, lesions- none, excoriation- none Lymphadenopathy- none Head- atraumatic            Eyes- Gross vision intact, PERRLA, conjunctivae and secretions clear            Ears- Hearing, canals-normal            Nose- Clear, no-Septal dev, mucus, polyps, erosion, perforation             Throat- Mallampati IV , mucosa clear , drainage- none, tonsils- atrophic, + teeth Neck- flexible , trachea midline, no stridor , thyroid nl, carotid no bruit Chest - symmetrical excursion , unlabored           Heart/CV- RRR , no murmur , no gallop  , no rub, nl s1 s2                           -  JVD- none , edema- none, stasis changes- none, varices- none           Lung- clear to P&A, wheeze- none, cough- none , dullness-none, rub- none           Chest wall-  Abd-  Br/ Gen/ Rectal- Not done, not indicated Extrem- cyanosis- none, clubbing, none, atrophy- none, strength- nl Neuro- grossly intact to observation

## 2021-04-22 ENCOUNTER — Telehealth: Payer: Self-pay | Admitting: Internal Medicine

## 2021-04-22 ENCOUNTER — Ambulatory Visit: Payer: BC Managed Care – PPO | Admitting: Internal Medicine

## 2021-04-23 NOTE — Telephone Encounter (Signed)
LMTCB   Per the documentation on the order [facility] has received the order.   Call made to [facility], they have the order.

## 2021-04-24 ENCOUNTER — Telehealth: Payer: Self-pay | Admitting: Internal Medicine

## 2021-04-24 DIAGNOSIS — R5381 Other malaise: Secondary | ICD-10-CM

## 2021-04-24 DIAGNOSIS — M169 Osteoarthritis of hip, unspecified: Secondary | ICD-10-CM

## 2021-04-24 DIAGNOSIS — U099 Post covid-19 condition, unspecified: Secondary | ICD-10-CM

## 2021-04-25 ENCOUNTER — Telehealth: Payer: Self-pay | Admitting: Internal Medicine

## 2021-04-25 NOTE — Telephone Encounter (Signed)
Called and spoke with patient's wife. She stated that he has been struggling with his cpap machine lately. Air has been coming out of the sides of the mask. They both looked at the machine and saw that the setting was on 5cm. She wanted to know if his machine as an autocpap. I reviewed the order as well as the information in [facility] and it does appear that he has an autocpap with 5-20cm setting. He is now scared to use the machine at night.   I advised her I would print out a download from his machine and have CY to review it. She verbalized understanding.   Will place download on CY's desk.   Dr. Maple Hudson, can you please advise? Thanks!

## 2021-04-25 NOTE — Telephone Encounter (Signed)
Called and spoke with both patient and his wife. He verbalized understanding. He wants to hold off on the mask refit session for now. He wants to try to tighten the mask to see if this will help. He is aware to call us if this does not work.   Nothing further needed at time of call.

## 2021-04-25 NOTE — Telephone Encounter (Signed)
Called and spoke with patient's spouse Deidre. She stated that the patient did receive a walker from [employer] but it was not the one he wanted. He wanted a rollator walker. Per Deidre, if can send over an updated order to state the rollator walker he will be able to get one today. I advised her that since MR had already approved the order that I could fix it. She verbalized understanding.   Will print, stamp and fax to the numbers provided.   Nothing further needed at time of call.

## 2021-04-25 NOTE — Telephone Encounter (Signed)
His CCPAP is set to range between 5 and 20 cwp. The m=number 5 they are seein is either the starting pressure or the humidifier setting. His machine has been ranging between 7 and 10 cwp while he is wearing it and is controlling apneas well. Order- please refer him to sleep center for Desensitization and mask fitting. Marita [place] will help wim with questions and also see about getting a better mask for him to wear.

## 2021-04-25 NOTE — Telephone Encounter (Signed)
Called and spoke to pt's wife. Informed her of the information per [place]. Pt's wife verbalized understanding and denied any further questions or concerns at this time.

## 2021-05-24 NOTE — Progress Notes (Signed)
12/19/20- 60 yoM never smoker for sleep evaluation courtesy of Dr Meryle Ready 11/02/20) Medical problem list includes Bronchiectasis, HTN, Covid pneumonia, June 2022, Chronic Respiratory Failure with Hypoxia, Elevated Hemidiaphragm, Hypercholesterolemia, Obesity, O2 2L used only for exertion Epworth score-4 Body weight today-262 lbs Covid vax- Complains of snoring, tiredd, waking often, tossing and turning. No ENT surgery. No lung problems prior to Covid infection. No heart probs. No parasomnias. Says lost 40 lbs related to Covid infection.  CT chest 11/29/20- IMPRESSION: 1. Pulmonary parenchymal pattern of peripheral and basilar predominant coarsened ground-glass and bronchiectasis/bronchiolectasis, minimally improved from 05/16/2020 and in keeping with sequelae of COVID-19 pneumonia. 2. Hepatic steatosis. 3. Enlarged pulmonary arteries, indicative of pulmonary arterial hypertension.  05/27/21- 60 yoM never smoker followed for OSA, complicated by Bronchiectasis(Dr Ramaswamy), HTN, Covid pneumonia, June 2022, Chronic Respiratory Failure with Hypoxia, Elevated Hemidiaphragm, Hypercholesterolemia, Obesity, O2 2L used only for exertion HST 02/21/21- AHI 44.3/ hr, desaturation to 70%, Body weight 262 lbs CPAP auto 5-20/ [employer]  ordered 03/13/21 Download- compliance 40%, AHI 0.5/ hr Body weight today-260 lbs Covid vax-none Flu vax-had -----Patient states that the mask for CPAP gets irritating and has to take it off. States that sometimes when he wakes up he has some chest discomfort and feels like he cant get a good breath. Download reviewed and comfort issues discussed. Will refer for mask fitting. We are going to explore what impact sample Trelegy may have on dyspnea he notes on morning waking.  Hip pain requires rolling walker.   ROS-see HPI   + = positive Constitutional:    weight loss, night sweats, fevers, chills, fatigue, lassitude. HEENT:    headaches, difficulty swallowing,  +tooth/dental problems, sore throat,       +sneezing, itching, ear ache, nasal congestion, post nasal drip, snoring CV:    chest pain, orthopnea, PND, swelling in lower extremities, anasarca,                                   dizziness, palpitations Resp:   +shortness of breath with exertion or at rest.                productive cough,   non-productive cough, coughing up of blood.              change in color of mucus.  wheezing.   Skin:    rash or lesions. GI:  No-   heartburn, +indigestion, abdominal pain, nausea, vomiting, diarrhea,                 change in bowel habits, loss of appetite GU: dysuria, change in color of urine, no urgency or frequency.   flank pain. MS:   +joint pain, +stiffness, decreased range of motion, back pain. Neuro-     nothing unusual Psych:  change in mood or affect.  depression or anxiety.   memory loss.   OBJ- Physical Exam General- Alert, Oriented, Affect-appropriate, Distress- none acute, + obese  Skin- rash-none, lesions- none, excoriation- none Lymphadenopathy- none Head- atraumatic            Eyes- Gross vision intact, PERRLA, conjunctivae and secretions clear            Ears- Hearing, canals-normal            Nose- Clear, no-Septal dev, mucus, polyps, erosion, perforation             Throat- Mallampati IV , mucosa clear ,  drainage- none, tonsils- atrophic, + teeth Neck- flexible , trachea midline, no stridor , thyroid nl, carotid no bruit Chest - symmetrical excursion , unlabored           Heart/CV- RRR , no murmur , no gallop  , no rub, nl s1 s2                           - JVD- none , edema- none, stasis changes- none, varices- none           Lung- clear to P&A, wheeze- none, cough- none , dullness-none, rub- none           Chest wall-  Abd-  Br/ Gen/ Rectal- Not done, not indicated Extrem- cyanosis- none, clubbing, none, atrophy- none, strength- nl Neuro- grossly intact to observation

## 2021-05-27 ENCOUNTER — Ambulatory Visit: Payer: BC Managed Care – PPO | Admitting: Internal Medicine

## 2021-05-27 ENCOUNTER — Other Ambulatory Visit: Payer: Self-pay

## 2021-05-27 ENCOUNTER — Encounter: Payer: Self-pay | Admitting: Internal Medicine

## 2021-05-27 VITALS — BP 128/82 | HR 108 | Temp 98.2°F | Ht 71.0 in | Wt 260.4 lb

## 2021-05-27 DIAGNOSIS — M169 Osteoarthritis of hip, unspecified: Secondary | ICD-10-CM

## 2021-05-27 DIAGNOSIS — J9611 Chronic respiratory failure with hypoxia: Secondary | ICD-10-CM | POA: Diagnosis not present

## 2021-05-27 DIAGNOSIS — G4733 Obstructive sleep apnea (adult) (pediatric): Secondary | ICD-10-CM | POA: Diagnosis not present

## 2021-05-27 MED ORDER — TRELEGY ELLIPTA 100-62.5-25 MCG/ACT IN AEPB: 1.0000 | INHALATION_SPRAY | Freq: Every day | RESPIRATORY_TRACT | 0 refills | Status: DC

## 2021-05-27 NOTE — Patient Instructions (Signed)
Order- sample Trelegy 100 inhaler   inhale 1 puff then rinse mouth, once daily  [facility] would be a reasonable group to check for hip replacement surgery  Order- mask fitting at sleep center  We can continue CPAP auto 5-20, mask of choice, humidifier, supplies, [facility]/ card  Please call if we can help

## 2021-06-13 ENCOUNTER — Encounter: Payer: Self-pay | Admitting: Internal Medicine

## 2021-06-13 NOTE — Assessment & Plan Note (Signed)
Benefits from CPAP Plan- continue auto 5-20. Refer for mask fitting/desensitization

## 2021-06-13 NOTE — Assessment & Plan Note (Signed)
Uses home O2 less often, now mostly for exertion.  Plan- try sample Trelegy 100

## 2021-06-18 ENCOUNTER — Other Ambulatory Visit (HOSPITAL_BASED_OUTPATIENT_CLINIC_OR_DEPARTMENT_OTHER): Payer: BC Managed Care – PPO | Admitting: Internal Medicine

## 2021-08-07 ENCOUNTER — Ambulatory Visit: Payer: BC Managed Care – PPO | Attending: Adult Reconstructive Orthopaedic Surgery | Admitting: Physical Therapy

## 2021-08-07 ENCOUNTER — Encounter: Payer: Self-pay | Admitting: Physical Therapy

## 2021-08-07 DIAGNOSIS — M25551 Pain in right hip: Secondary | ICD-10-CM | POA: Insufficient documentation

## 2021-08-07 DIAGNOSIS — M6281 Muscle weakness (generalized): Secondary | ICD-10-CM | POA: Insufficient documentation

## 2021-08-07 DIAGNOSIS — M25651 Stiffness of right hip, not elsewhere classified: Secondary | ICD-10-CM | POA: Diagnosis present

## 2021-08-07 DIAGNOSIS — R262 Difficulty in walking, not elsewhere classified: Secondary | ICD-10-CM | POA: Insufficient documentation

## 2021-08-07 NOTE — Patient Instructions (Signed)
Access Code: [email or web address] ?URL: [email or web address] ?Date: 08/07/2021 ?Prepared by: Glenetta Hew ? ?Exercises ?- Supine Ankle Pumps  - 2-3 x daily - 7 x weekly - 2-3 sets - 10 reps ?- Supine Gluteal Sets  - 2-3 x daily - 7 x weekly - 2-3 sets - 10 reps ?- Supine Heel Slide with Strap  - 2-3 x daily - 7 x weekly - 2-3 sets - 10 reps ?- Supine Hip Abduction  - 2-3 x daily - 7 x weekly - 2-3 sets - 10 reps ?- Seated Ankle Pumps  - 3 x daily - 7 x weekly - 10 reps ?- Heel Raises with Counter Support  - 1 x daily - 7 x weekly - 2-3 sets - 10 reps ?- Standing Hip Abduction with Counter Support  - 1 x daily - 7 x weekly - 2-3 sets - 10 reps ?- Standing Hip Extension with Counter Support  - 1 x daily - 7 x weekly - 2-3 sets - 10 reps ?

## 2021-08-07 NOTE — Therapy (Signed)
[facility] ?[facility] ?[address] ?[place], [place], [address] ?Phone: [phone]   Fax:  [phone] ? ?Physical Therapy Evaluation ? ?Patient Details  ?Name: Vickie Baye ?MRN: [id number] ?Date of Birth: [birth year] ?Referring Provider (PT): Rosanne Ashing MD ? ? ?Encounter Date: 08/07/2021 ? ? PT End of Session - 08/07/21 1403   ? ? Visit Number 1   ? Number of Visits 16   ? Date for PT Re-Evaluation 10/02/21   ? Authorization Type BCBS   ? PT Start Time 1319   ? PT Stop Time 1400   ? PT Time Calculation (min) 41 min   ? Activity Tolerance Patient tolerated treatment well   ? Behavior During Therapy [facility] for tasks assessed/performed   ? ?  ?  ? ?  ? ? ?Past Medical History:  ?Diagnosis Date  ? Hyperlipidemia   ? Hypertension   ? ? ?Past Surgical History:  ?Procedure Laterality Date  ? REVISION TOTAL HIP ARTHROPLASTY    ? ? ?There were no vitals filed for this visit. ? ? ? Subjective Assessment - 08/07/21 1325   ? ? Subjective Patient had posterior R hip replacement on 08/05/21. He had previous L THA in 2015.  He reports he was told to not twist leg too much, otherwise no precautions.  Pain is well controlled currently with CP and tylenol PRN.   ? Pertinent History hypertension, obesity class II, dyslipidemia and DJD, long COVID, OSA   ? How long can you stand comfortably? few minutes   ? How long can you walk comfortably? few minutes   ? Patient Stated Goals "be back to some sort of normal, regular activities, walking without cane"   ? Currently in Pain? Yes   ? Pain Score 0-No pain   3-4/10 with weight bearing, 5-6/10 with steps  ? Pain Location Hip   ? Pain Orientation Right   ? Pain Descriptors / Indicators Aching   ? Pain Type Surgical pain   ? Pain Radiating Towards feels more in upper thigh   ? Pain Onset In the past 7 days   ? Pain Frequency Occasional   ? Aggravating Factors  steps, standing on it   ? Pain Relieving Factors cold packs and tylenol   ? ?  ?   ? ?  ? ? ? ? ? [facility] PT Assessment - 08/07/21 0001   ? ?  ? Assessment  ? Medical Diagnosis Z96.649 (ICD-10-CM) - S/P R hip replacement   ? Referring Provider (PT) Rosanne Ashing MD   ? Onset Date/Surgical Date 08/05/21   ? Next MD Visit 08/17/2021   ? Prior Therapy 2015 after L THA   ?  ? Precautions  ? Precautions Posterior Hip   ?  ? Restrictions  ? Weight Bearing Restrictions Yes   ?  ? Balance Screen  ? Has the patient fallen in the past 6 months No   ? Has the patient had a decrease in activity level because of a fear of falling?  No   ? Is the patient reluctant to leave their home because of a fear of falling?  No   ?  ? Home Environment  ? Living Environment Private residence   ? Living Arrangements Spouse/significant other   ? Type of Home House   ? Home Access Level entry   ? Home Layout Multi-level   ? Alternate Level Stairs-Number of Steps 7   ? Alternate Level Stairs-Rails  Right;Left;Can reach both   ? [place] - 2 wheels;Cane - single point;Toilet riser   ?  ? Prior Function  ? Level of Independence Independent   ? Vocation On disability   ? Leisure fishing, keeping up with grandsons   ?  ? Cognition  ? Overall Cognitive Status Within Functional Limits for tasks assessed   ?  ? Observation/Other Assessments  ? Observations enters slowly with SPC   ? Skin Integrity incision not visualized today, wearing compression hose   ? Focus on Therapeutic Outcomes (FOTO)  hip: 43%, predicted outcome after 14 visits 75%   ?  ? ROM / Strength  ? AROM / PROM / Strength AROM;Strength   ?  ? AROM  ? Overall AROM  Deficits;Due to precautions   ? Overall AROM Comments R hip deficits due to precautions, not formally measured today   ?  ? Strength  ? Overall Strength Deficits;Due to precautions;Due to pain   ? Overall Strength Comments decreased R hip strength, 3/5 grossly.   5/5 bil quad strength and ankle DF strength.   ?  ? Transfers  ? Transfers Sit to Stand   ? Sit to Stand 6: Modified independent  (Device/Increase time)   ? Comments keeps RLE extended due to precautions, UE assist.   ?  ? Ambulation/Gait  ? Ambulation/Gait Yes   ? Ambulation/Gait Assistance 6: Modified independent (Device/Increase time)   ? Ambulation Distance (Feet) 50 Feet   ? Assistive device Straight cane   ? Gait Pattern Step-through pattern;Decreased stance time - right;Decreased weight shift to right   ? Gait velocity 0.46 m/s or 1.5 ft/sec   ?  ? Balance  ? Balance Assessed Yes   ?  ? Standardized Balance Assessment  ? Standardized Balance Assessment Timed Up and Go Test   ?  ? Timed Up and Go Test  ? TUG Normal TUG   ? Normal TUG (seconds) 30   ? TUG Comments with SPC   ? ?  ?  ? ?  ? ? ? ? ? ? ? ? ? ? ? ? ? ?Objective measurements completed on examination: See above findings.  ? ? ? ? ? [facility] Adult PT Treatment/Exercise - 08/07/21 0001   ? ?  ? Exercises  ? Exercises Knee/Hip   ?  ? Knee/Hip Exercises: Standing  ? Heel Raises Both;15 reps   ? Hip Abduction Stengthening;Right;10 reps   ? Abduction Limitations counter support   ? Hip Extension Stengthening;Right;10 reps   ? Extension Limitations counter support   ?  ? Knee/Hip Exercises: Seated  ? Other Seated Knee/Hip Exercises ankle pumps x 10   ?  ? Knee/Hip Exercises: Supine  ? Other Supine Knee/Hip Exercises reviewed supine exercises verbally including hip abduction, ankle pumps, heel slides and glut sets.  Recommended using belt/strap to assist with heel slides if neede.d   ? ?  ?  ? ?  ? ? ? ? ? ? ? ? ? ? PT Education - 08/07/21 1356   ? ? Education Details Access Code: [email or web address] Reviewed and progress HEP and precautions.   ? Person(s) Educated Patient   ? Methods Explanation;Demonstration;Verbal cues;Handout   ? Comprehension Verbalized understanding;Returned demonstration   ? ?  ?  ? ?  ? ? ? PT Short Term Goals - 08/07/21 1656   ? ?  ? PT SHORT TERM GOAL #1  ? Title Ind with initial HEP   ? Time 1   ?  Period Weeks   ? Status New   ? Target Date 08/14/21   ?  ? PT SHORT TERM  GOAL #2  ? Title Demonstrate improved balance and decreased risk of falls by completing TUG <25 seconds   ? Baseline 30 sec with SPC   ? Time 4   ? Period Weeks   ? Status New   ? ?  ?  ? ?  ? ? ? ? PT Long Term Goals - 08/07/21 1656   ? ?  ? PT LONG TERM GOAL #1  ? Title Ind with progressed HEP to improve functional outcomes.   ? Time 8   ? Period Weeks   ? Status New   ? Target Date 10/02/21   ?  ? PT LONG TERM GOAL #2  ? Title Demonstrate improved functional ability as demonstrated by FOTO score of 75%.   ? Baseline 43%   ? Time 8   ? Period Weeks   ? Status New   ? Target Date 10/02/21   ?  ? PT LONG TERM GOAL #3  ? Title Be able to ascend/descend 14 stairs with HR, reciprocal step and no increase in R hip pain safely to access home.   ? Baseline increased pain 5-6/10 with stairs at home.   ? Time 8   ? Period Weeks   ? Status New   ? Target Date 10/02/21   ?  ? PT LONG TERM GOAL #4  ? Title Able to ambulate 900 ft safely without AD in 6 minutes to improve community access.   ? Baseline slow gait speed with SPC, limited to household distances   ? Time 8   ? Period Weeks   ? Status New   ? Target Date 10/02/21   ?  ? PT LONG TERM GOAL #5  ? Title Pt. will demonstrate decreased risk of falls as demonstrated by FGA >22/30   ? Baseline deferred today   ? Time 8   ? Period Weeks   ? Status New   ? Target Date 10/02/21   ? ?  ?  ? ?  ? ? ? ? ? ? ? ? ? Plan - 08/07/21 1404   ? ? Clinical Impression Statement Mr. Deppe is a 61 year old male s/p R posterior hip replacement on 08/05/2021.  He reports good pain control and is compliant with initial HEP, able to verbalize all exercise correctly today without printed HEP.  He scored He demonstrates impairments in gait, strength, and impared ADLs due to R hip pain.  His score on FOTO was 43%= 57% disability due to R hip pain.  He would benefit from skilled physical therapy to improve R hip strength, gait, and decrease pain and improve ability to participate in all desired  activities without limitation.  Today progressed exercises to include standing exercises, which he was able to do safely.   ? Personal Factors and Comorbidities Comorbidity 3+   ? Comorbidities HTN, obesity, dyslipidem

## 2021-08-09 ENCOUNTER — Ambulatory Visit: Payer: BC Managed Care – PPO | Admitting: Physical Therapy

## 2021-08-09 ENCOUNTER — Encounter: Payer: Self-pay | Admitting: Physical Therapy

## 2021-08-09 DIAGNOSIS — M6281 Muscle weakness (generalized): Secondary | ICD-10-CM

## 2021-08-09 DIAGNOSIS — M25651 Stiffness of right hip, not elsewhere classified: Secondary | ICD-10-CM

## 2021-08-09 DIAGNOSIS — M25551 Pain in right hip: Secondary | ICD-10-CM

## 2021-08-09 DIAGNOSIS — R262 Difficulty in walking, not elsewhere classified: Secondary | ICD-10-CM

## 2021-08-09 NOTE — Patient Instructions (Signed)
Access Code: [email or web address] ?URL: [email or web address] ?Date: 08/09/2021 ?Prepared by: Glenetta Hew ? ?Exercises Added  ? ?- Bent Knee Fallouts  - 1 x daily - 7 x weekly - 2-3 sets - 10 reps ?- Supine Bridge  - 1 x daily - 7 x weekly - 2-3 sets - 10 reps ?- Sit to Stand with Counter Support  - 1 x daily - 7 x weekly - 3 sets - 10 reps ?

## 2021-08-09 NOTE — Therapy (Signed)
[facility] ?[facility] ?[address]  [address] ?[place], [place], [address] ?Phone: [phone]   Fax:  [phone] ? ?Physical Therapy Treatment ? ?Patient Details  ?Name: Darren Wood ?MRN: [id number] ?Date of Birth: [birth year] ?Referring Provider (PT): Molly Maduro MD ? ? ?Encounter Date: 08/09/2021 ? ? PT End of Session - 08/09/21 1109   ? ? Visit Number 2   ? Number of Visits 16   ? Date for PT Re-Evaluation 10/02/21   ? Authorization Type BCBS   ? PT Start Time 1104   ? PT Stop Time 1143   ? PT Time Calculation (min) 39 min   ? Activity Tolerance Patient tolerated treatment well   ? Behavior During Therapy [facility] for tasks assessed/performed   ? ?  ?  ? ?  ? ? ?Past Medical History:  ?Diagnosis Date  ? Hyperlipidemia   ? Hypertension   ? ? ?Past Surgical History:  ?Procedure Laterality Date  ? REVISION TOTAL HIP ARTHROPLASTY    ? ? ?There were no vitals filed for this visit. ? ? Subjective Assessment - 08/09/21 1107   ? ? Subjective Patient reported more R hip pain yesterday, got the oxycodone and took one last night and slept.  No problem with the exercises.   ? Pertinent History hypertension, obesity class II, dyslipidemia and DJD, long COVID, OSA   ? How long can you stand comfortably? few minutes   ? How long can you walk comfortably? few minutes   ? Patient Stated Goals "be back to some sort of normal, regular activities, walking without cane"   ? Currently in Pain? Yes   ? Pain Score 4    ? Pain Location Hip   ? Pain Orientation Right   ? Pain Descriptors / Indicators Aching   ? Pain Onset In the past 7 days   ? ?  ?  ? ?  ? ? ? ? ? ? ? ? ? ? ? ? ? ? ? ? ? ? ? ? [facility] Adult PT Treatment/Exercise - 08/09/21 0001   ? ?  ? Exercises  ? Exercises Knee/Hip   ?  ? Knee/Hip Exercises: Aerobic  ? Nustep L5 x 6 min   ?  ? Knee/Hip Exercises: Seated  ? Long [employer] Strengthening;Right;2 sets;10 reps   ? Long [employer] Limitations reported some R knee popping but not painful.    ? Sit to Sand 2 sets;10 reps;with UE support   cues for equal WB through bil LE, 2 airex added to raise table height to prevent hip flexion >90 deg.  ?  ? Knee/Hip Exercises: Supine  ? Heel Slides Strengthening;Right;20 reps   ? Heel Slides Limitations with strap   ? Bridges Strengthening;Both;2 sets;10 reps   ? Bridges Limitations tolerated very well   ? Other Supine Knee/Hip Exercises hip abduction R 2 x 10   ? Other Supine Knee/Hip Exercises bent knee fall-outs x 20   ? ?  ?  ? ?  ? ? ? ? ? ? ? ? ? ? PT Education - 08/09/21 1148   ? ? Education Details HEP update Access Code: [email or web address]   ? Person(s) Educated Patient   ? Methods Explanation;Demonstration;Verbal cues;Handout   ? Comprehension Verbalized understanding;Returned demonstration   ? ?  ?  ? ?  ? ? ? PT Short Term Goals - 08/09/21 1109   ? ?  ? PT SHORT TERM GOAL #1  ? Title Ind  with initial HEP   ? Time 1   ? Period Weeks   ? Status Achieved   ? Target Date 08/14/21   ?  ? PT SHORT TERM GOAL #2  ? Title Demonstrate improved balance and decreased risk of falls by completing TUG <25 seconds   ? Baseline 30 sec with SPC   ? Time 4   ? Period Weeks   ? Status On-going   ? ?  ?  ? ?  ? ? ? ? PT Long Term Goals - 08/09/21 1110   ? ?  ? PT LONG TERM GOAL #1  ? Title Ind with progressed HEP to improve functional outcomes.   ? Time 8   ? Period Weeks   ? Status On-going   ? Target Date 10/02/21   ?  ? PT LONG TERM GOAL #2  ? Title Demonstrate improved functional ability as demonstrated by FOTO score of 75%.   ? Baseline 43%   ? Time 8   ? Period Weeks   ? Status On-going   ? Target Date 10/02/21   ?  ? PT LONG TERM GOAL #3  ? Title Be able to ascend/descend 14 stairs with HR, reciprocal step and no increase in R hip pain safely to access home.   ? Baseline increased pain 5-6/10 with stairs at home.   ? Time 8   ? Period Weeks   ? Status On-going   ? Target Date 10/02/21   ?  ? PT LONG TERM GOAL #4  ? Title Able to ambulate 900 ft safely without AD in 6 minutes to  improve community access.   ? Baseline slow gait speed with SPC, limited to household distances   ? Time 8   ? Period Weeks   ? Status On-going   ? Target Date 10/02/21   ?  ? PT LONG TERM GOAL #5  ? Title Pt. will demonstrate decreased risk of falls as demonstrated by FGA >22/30   ? Baseline deferred today   ? Time 8   ? Period Weeks   ? Status On-going   ? Target Date 10/02/21   ? ?  ?  ? ?  ? ? ? ? ? ? ? ? Plan - 08/09/21 1147   ? ? Clinical Impression Statement [name] reported more hip pain this week, however he was able to participate fully in session, reporting decreased pain following.  reports good compliance with HEP at home, meeting STG #1.  Focused today on progressing HEP for R hip strengthening, while maintaining hip precautions.  He would benefit from continued skilled therapy.  Declined modalities at end of session, will apply CP at home.   ? Personal Factors and Comorbidities Comorbidity 3+   ? Comorbidities HTN, obesity, dyslipidemia, DJD, long COVID, OSA, DOE, L THA   ? Examination-Activity Limitations Bed Mobility;Bend;Carry;Lift;Locomotion Level;Squat;Stairs;Stand;Transfers   ? Examination-Participation Restrictions Cleaning;Meal Prep;Interpersonal Relationship;Community Activity;Laundry;Shop;Yard Work   ? Stability/Clinical Decision Making Evolving/Moderate complexity   ? Rehab Potential Excellent   ? PT Frequency 2x / week   ? PT Duration 8 weeks   ? PT Treatment/Interventions ADLs/Self Care Home Management;Cryotherapy;Electrical Stimulation;Moist Heat;Stair training;Functional mobility training;Therapeutic activities;Gait training;Therapeutic exercise;Balance training;Neuromuscular re-education;Patient/family education;Manual techniques;Dry needling;Taping;Passive range of motion;Joint Manipulations   ? PT Next Visit Plan review and progress HEP, modalities PRN   ? PT Home Exercise Plan Access Code: [email or web address]   ? Consulted and Agree with Plan of Care Patient   ? ?  ?  ? ?  ? ? ?  Patient will  benefit from skilled therapeutic intervention in order to improve the following deficits and impairments:  Abnormal gait, Decreased activity tolerance, Decreased endurance, Decreased range of motion, Decreased skin integrity, Decreased strength, Increased fascial restricitons, Pain, Decreased balance, Decreased mobility, Decreased safety awareness, Difficulty walking, Increased edema, Increased muscle spasms, Impaired flexibility ? ?Visit Diagnosis: ?Pain in right hip ? ?Stiffness of right hip, not elsewhere classified ? ?Muscle weakness (generalized) ? ?Difficulty in walking, not elsewhere classified ? ? ? ? ?Problem List ?Patient Active Problem List  ? Diagnosis Date Noted  ? OSA (obstructive sleep apnea) 12/23/2020  ? History of COVID-19 10/04/2020  ? Memory loss 10/04/2020  ? Chronic respiratory failure with hypoxia (HCC) 10/04/2020  ? Pneumonia due to COVID-19 virus 02/13/2020  ? Acute respiratory failure with hypoxia (HCC) 02/13/2020  ? Obesity (BMI 30-39.9) 11/18/2017  ? Pure hypercholesterolemia 05/19/2017  ? Essential hypertension 04/21/2017  ? History of total hip arthroplasty, left 10/20/2013  ? Osteoarthritis of hip 06/14/2013  ? ? ?Jena Gauss, PT, DPT  ?08/09/2021, 11:52 AM ? ?[facility] ?[facility] ?[address]  [address] ?[place], [place], [address] ?Phone: [phone]   Fax:  [phone] ? ?Name: Gehrig Patras ?MRN: [id number] ?Date of Birth: [birth year] ? ? ? ?

## 2021-08-13 ENCOUNTER — Ambulatory Visit: Payer: BC Managed Care – PPO

## 2021-08-13 DIAGNOSIS — M6281 Muscle weakness (generalized): Secondary | ICD-10-CM

## 2021-08-13 DIAGNOSIS — R262 Difficulty in walking, not elsewhere classified: Secondary | ICD-10-CM

## 2021-08-13 DIAGNOSIS — M25551 Pain in right hip: Secondary | ICD-10-CM

## 2021-08-13 DIAGNOSIS — M25651 Stiffness of right hip, not elsewhere classified: Secondary | ICD-10-CM

## 2021-08-13 NOTE — Therapy (Signed)
[place] ?[facility] ?[address] ?[place], [place], [address] ?Phone: [phone]   Fax:  [phone] ? ?Physical Therapy Treatment ? ?Patient Details  ?Name: Darren Wood ?MRN: [id number] ?Date of Birth: [birth year] ?Referring Provider (PT): Rosanne Ashing MD ? ? ?Encounter Date: 08/13/2021 ? ? PT End of Session - 08/13/21 1151   ? ? Visit Number 3   ? Number of Visits 16   ? Date for PT Re-Evaluation 10/02/21   ? Authorization Type BCBS   ? PT Start Time 1103   ? PT Stop Time 1146   ? PT Time Calculation (min) 43 min   ? Activity Tolerance Patient tolerated treatment well   ? Behavior During Therapy [facility] for tasks assessed/performed   ? ?  ?  ? ?  ? ? ?Past Medical History:  ?Diagnosis Date  ? Hyperlipidemia   ? Hypertension   ? ? ?Past Surgical History:  ?Procedure Laterality Date  ? REVISION TOTAL HIP ARTHROPLASTY    ? ? ?There were no vitals filed for this visit. ? ? Subjective Assessment - 08/13/21 1106   ? ? Subjective Pt reports doing too much with stairs this weekend and now having soreness.   ? Pertinent History hypertension, obesity class II, dyslipidemia and DJD, long COVID, OSA   ? Patient Stated Goals "be back to some sort of normal, regular activities, walking without cane"   ? Currently in Pain? Yes   ? Pain Score 5    ? Pain Location Hip   ? Pain Orientation Right;Lateral   ? ?  ?  ? ?  ? ? ? ? ? [facility] PT Assessment - 08/13/21 0001   ? ?  ? Timed Up and Go Test  ? TUG Normal TUG   ? Normal TUG (seconds) 17   ? TUG Comments with SPC   ? ?  ?  ? ?  ? ? ? ? ? ? ? ? ? ? ? ? ? ? ? ? [place] Adult PT Treatment/Exercise - 08/13/21 0001   ? ?  ? Ambulation/Gait  ? Stairs Yes   ? Gait Comments reviewed proper sequence of stair navigation with pt. Instructed on going up with good leg and down with bad leg to avoid irritation of the R hip; 1 hand rail on R needed for assistance   ?  ? Knee/Hip Exercises: Aerobic  ? Nustep L5 x 6 min   ?  ? Knee/Hip Exercises:  Standing  ? Terminal Knee Extension Strengthening;Right;20 reps;Theraband   ? Theraband Level (Terminal Knee Extension) Level 2 (Red)   ? Hip Abduction Stengthening;Both;20 reps;Knee straight   ? Abduction Limitations UE support   ? Hip Extension Stengthening;Both;10 reps;Knee straight   ? Extension Limitations UE support   ?  ? Knee/Hip Exercises: Seated  ? Long [employer] Strengthening;Both;2 sets;10 reps;Weights   ? Long Arc Quad Weight 2 lbs.   ? Hamstring Curl Strengthening;Both;20 reps   ? Hamstring Limitations RTB   ?  ? Knee/Hip Exercises: Supine  ? Bridges Strengthening;Both;20 reps   ? Straight Leg Raises Strengthening;Right;10 reps   ? Straight Leg Raises Limitations to ~30 deg   ? ?  ?  ? ?  ? ? ? ? ? ? ? ? ? ? ? ? PT Short Term Goals - 08/13/21 1141   ? ?  ? PT SHORT TERM GOAL #1  ? Title Ind with initial HEP   ? Time 1   ?  Period Weeks   ? Status Achieved   ? Target Date 08/14/21   ?  ? PT SHORT TERM GOAL #2  ? Title Demonstrate improved balance and decreased risk of falls by completing TUG <25 seconds   ? Baseline 30 sec with SPC   ? Time 4   ? Period Weeks   ? Status Achieved   ? ?  ?  ? ?  ? ? ? ? PT Long Term Goals - 08/09/21 1110   ? ?  ? PT LONG TERM GOAL #1  ? Title Ind with progressed HEP to improve functional outcomes.   ? Time 8   ? Period Weeks   ? Status On-going   ? Target Date 10/02/21   ?  ? PT LONG TERM GOAL #2  ? Title Demonstrate improved functional ability as demonstrated by FOTO score of 75%.   ? Baseline 43%   ? Time 8   ? Period Weeks   ? Status On-going   ? Target Date 10/02/21   ?  ? PT LONG TERM GOAL #3  ? Title Be able to ascend/descend 14 stairs with HR, reciprocal step and no increase in R hip pain safely to access home.   ? Baseline increased pain 5-6/10 with stairs at home.   ? Time 8   ? Period Weeks   ? Status On-going   ? Target Date 10/02/21   ?  ? PT LONG TERM GOAL #4  ? Title Able to ambulate 900 ft safely without AD in 6 minutes to improve community access.   ?  Baseline slow gait speed with SPC, limited to household distances   ? Time 8   ? Period Weeks   ? Status On-going   ? Target Date 10/02/21   ?  ? PT LONG TERM GOAL #5  ? Title Pt. will demonstrate decreased risk of falls as demonstrated by FGA >22/30   ? Baseline deferred today   ? Time 8   ? Period Weeks   ? Status On-going   ? Target Date 10/02/21   ? ?  ?  ? ?  ? ? ? ? ? ? ? ? Plan - 08/13/21 1152   ? ? Clinical Impression Statement Pt came in today reporting that he had overdid exercises and stairs over the weekend. Demonstrated fatigue with standing TE, especially with R LE WB but reported no increase in pain. Cues to keep knee straight with hip extension and for upright posture with standing TE. He demonstrated a tendency to ascend stairs with R LE and descend with L LE, so we had to reinforce "up with good and down with bad" prinicple. Also met STG 2 today, completing TUG in less than 25 sec.   ? Personal Factors and Comorbidities Comorbidity 3+   ? Comorbidities HTN, obesity, dyslipidemia, DJD, long COVID, OSA, DOE, L THA   ? PT Frequency 2x / week   ? PT Duration 8 weeks   ? PT Treatment/Interventions ADLs/Self Care Home Management;Cryotherapy;Electrical Stimulation;Moist Heat;Stair training;Functional mobility training;Therapeutic activities;Gait training;Therapeutic exercise;Balance training;Neuromuscular re-education;Patient/family education;Manual techniques;Dry needling;Taping;Passive range of motion;Joint Manipulations   ? PT Next Visit Plan progress hip strengthening, reinforce stairs up with good and down with bad prinicple, modalities PRN   ? PT Home Exercise Plan Access Code: [email or web address]   ? Consulted and Agree with Plan of Care Patient   ? ?  ?  ? ?  ? ? ?Patient will benefit from skilled therapeutic intervention in order  to improve the following deficits and impairments:  Abnormal gait, Decreased activity tolerance, Decreased endurance, Decreased range of motion, Decreased skin integrity, Decreased  strength, Increased fascial restricitons, Pain, Decreased balance, Decreased mobility, Decreased safety awareness, Difficulty walking, Increased edema, Increased muscle spasms, Impaired flexibility ? ?Visit Diagnosis: ?Pain in right hip ? ?Stiffness of right hip, not elsewhere classified ? ?Muscle weakness (generalized) ? ?Difficulty in walking, not elsewhere classified ? ? ? ? ?Problem List ?Patient Active Problem List  ? Diagnosis Date Noted  ? OSA (obstructive sleep apnea) 12/23/2020  ? History of COVID-19 10/04/2020  ? Memory loss 10/04/2020  ? Chronic respiratory failure with hypoxia ([place]) 10/04/2020  ? Pneumonia due to COVID-19 virus 02/13/2020  ? Acute respiratory failure with hypoxia ([place]) 02/13/2020  ? Obesity (BMI 30-39.9) 11/18/2017  ? Pure hypercholesterolemia 05/19/2017  ? Essential hypertension 04/21/2017  ? History of total hip arthroplasty, left 10/20/2013  ? Osteoarthritis of hip 06/14/2013  ? ? ?Artist Pais, PTA ?08/13/2021, 12:01 PM ? ?[place] ?[facility] ?[address] ?[place], [place], [address] ?Phone: [phone]   Fax:  [phone] ? ?Name: Jerod Mcquain ?MRN: [id number] ?Date of Birth: June 22, 1960 ? ? ? ?

## 2021-08-16 ENCOUNTER — Ambulatory Visit: Payer: BC Managed Care – PPO | Admitting: Physical Therapy

## 2021-08-20 ENCOUNTER — Ambulatory Visit: Payer: BC Managed Care – PPO | Admitting: Physical Therapy

## 2021-08-20 ENCOUNTER — Encounter: Payer: Self-pay | Admitting: Physical Therapy

## 2021-08-20 DIAGNOSIS — M25551 Pain in right hip: Secondary | ICD-10-CM

## 2021-08-20 DIAGNOSIS — R262 Difficulty in walking, not elsewhere classified: Secondary | ICD-10-CM

## 2021-08-20 DIAGNOSIS — M6281 Muscle weakness (generalized): Secondary | ICD-10-CM

## 2021-08-20 DIAGNOSIS — M25651 Stiffness of right hip, not elsewhere classified: Secondary | ICD-10-CM

## 2021-08-20 NOTE — Therapy (Signed)
[place] ?[facility] ?[address]  [address] ?[place], [place], [address] ?Phone: [phone]   Fax:  [phone] ? ?Physical Therapy Treatment ? ?Patient Details  ?Name: Darren Wood ?MRN: [id number] ?Date of Birth: 1960/11/09 ?Referring Provider (PT): Molly Maduro MD ? ? ?Encounter Date: 08/20/2021 ? ? PT End of Session - 08/20/21 1104   ? ? Visit Number 4   ? Number of Visits 16   ? Date for PT Re-Evaluation 10/02/21   ? Authorization Type BCBS   ? PT Start Time 1101   ? PT Stop Time 1145   ? PT Time Calculation (min) 44 min   ? Activity Tolerance Patient tolerated treatment well   ? Behavior During Therapy [facility] for tasks assessed/performed   ? ?  ?  ? ?  ? ? ?Past Medical History:  ?Diagnosis Date  ? Hyperlipidemia   ? Hypertension   ? ? ?Past Surgical History:  ?Procedure Laterality Date  ? REVISION TOTAL HIP ARTHROPLASTY    ? ? ?There were no vitals filed for this visit. ? ? Subjective Assessment - 08/20/21 1103   ? ? Subjective Pt was doing ok but then tried to get into truck for first time in months. Getting into truck caused "the worse pain I've had sincer surgery, I'm not doing that again".   Still very painful today.  Reports up to yesterday doing well, not having trouble with stairs.   ? Pertinent History hypertension, obesity class II, dyslipidemia and DJD, long COVID, OSA   ? Patient Stated Goals "be back to some sort of normal, regular activities, walking without cane"   ? Currently in Pain? Yes   ? Pain Score 7    ? Pain Location Hip   ? Pain Orientation Right;Lateral   ? Pain Descriptors / Indicators Aching   ? ?  ?  ? ?  ? ? ? ? ? ? ? ? ? ? ? ? ? ? ? ? ? ? ? ? [facility] Adult PT Treatment/Exercise - 08/20/21 0001   ? ?  ? Knee/Hip Exercises: Aerobic  ? Nustep L5 x 6 min   ?  ? Knee/Hip Exercises: Standing  ? Forward Step Up Both;2 sets;10 reps;Hand Hold: 2;Step Height: 2"   ? Forward Step Up Limitations cues to bend R knee to load rather than pull self foward    ?  ? Knee/Hip Exercises: Seated  ? Long [employer] Strengthening;Right;10 reps   ? Long [employer] Limitations started with 2lbs, too painful, tried without weight, still painful so d/c'd for today   ? Sit to Sand 2 sets;10 reps;with UE support   ?  ? Knee/Hip Exercises: Supine  ? Hip Adduction Isometric Strengthening;Both;10 reps   ? Hip Adduction Isometric Limitations 5 sec hold with ball   ? Bridges Strengthening;Both;2 sets;10 reps   ? Other Supine Knee/Hip Exercises bent knee fall outs R 2 x 15 - decreased pain   ? Other Supine Knee/Hip Exercises supine marches x 10 bil - increased pain   ?  ? Manual Therapy  ? Manual Therapy Soft tissue mobilization;Myofascial release   ? Manual therapy comments to decrease muscle spasm and pain, supine   ? Soft tissue mobilization IASTM with the stick to R hip flexors, decreased pain   ? Myofascial Release TPR to R proximal rectus femoris   ? ?  ?  ? ?  ? ? ? ? ? ? ? ? ? ? ? ?  PT Short Term Goals - 08/13/21 1141   ? ?  ? PT SHORT TERM GOAL #1  ? Title Ind with initial HEP   ? Time 1   ? Period Weeks   ? Status Achieved   ? Target Date 08/14/21   ?  ? PT SHORT TERM GOAL #2  ? Title Demonstrate improved balance and decreased risk of falls by completing TUG <25 seconds   ? Baseline 30 sec with SPC   ? Time 4   ? Period Weeks   ? Status Achieved   ? ?  ?  ? ?  ? ? ? ? PT Long Term Goals - 08/09/21 1110   ? ?  ? PT LONG TERM GOAL #1  ? Title Ind with progressed HEP to improve functional outcomes.   ? Time 8   ? Period Weeks   ? Status On-going   ? Target Date 10/02/21   ?  ? PT LONG TERM GOAL #2  ? Title Demonstrate improved functional ability as demonstrated by FOTO score of 75%.   ? Baseline 43%   ? Time 8   ? Period Weeks   ? Status On-going   ? Target Date 10/02/21   ?  ? PT LONG TERM GOAL #3  ? Title Be able to ascend/descend 14 stairs with HR, reciprocal step and no increase in R hip pain safely to access home.   ? Baseline increased pain 5-6/10 with stairs at home.   ? Time 8    ? Period Weeks   ? Status On-going   ? Target Date 10/02/21   ?  ? PT LONG TERM GOAL #4  ? Title Able to ambulate 900 ft safely without AD in 6 minutes to improve community access.   ? Baseline slow gait speed with SPC, limited to household distances   ? Time 8   ? Period Weeks   ? Status On-going   ? Target Date 10/02/21   ?  ? PT LONG TERM GOAL #5  ? Title Pt. will demonstrate decreased risk of falls as demonstrated by FGA >22/30   ? Baseline deferred today   ? Time 8   ? Period Weeks   ? Status On-going   ? Target Date 10/02/21   ? ?  ?  ? ?  ? ? ? ? ? ? ? ? Plan - 08/20/21 1104   ? ? Clinical Impression Statement Pt. reported increased R hip pain today after attempting to get into his truck yesterday.  He demonstrated significant muscle spasm in R anterior hip flexors (primarily rectus femors), which limited his participation with therapeutic exercise today, although he reported decreased pain with R bent knee fall outs, otherwise any exercise with R hip flexion increased pain.  He reported release with manual therapy to R hip flexors, will continue CP and try massage gun on area at home.  Pt. would benefit from continued skilled therapy.  Follows up with orthopedist today.   ? Personal Factors and Comorbidities Comorbidity 3+   ? Comorbidities HTN, obesity, dyslipidemia, DJD, long COVID, OSA, DOE, L THA   ? PT Frequency 2x / week   ? PT Duration 8 weeks   ? PT Treatment/Interventions ADLs/Self Care Home Management;Cryotherapy;Electrical Stimulation;Moist Heat;Stair training;Functional mobility training;Therapeutic activities;Gait training;Therapeutic exercise;Balance training;Neuromuscular re-education;Patient/family education;Manual techniques;Dry needling;Taping;Passive range of motion;Joint Manipulations   ? PT Next Visit Plan progress hip strengthening, reinforce stairs up with good and down with bad prinicple, modalities PRN   ?  PT Home Exercise Plan Access Code: [email or web address]   ? Consulted and Agree with Plan  of Care Patient   ? ?  ?  ? ?  ? ? ?Patient will benefit from skilled therapeutic intervention in order to improve the following deficits and impairments:  Abnormal gait, Decreased activity tolerance, Decreased endurance, Decreased range of motion, Decreased skin integrity, Decreased strength, Increased fascial restricitons, Pain, Decreased balance, Decreased mobility, Decreased safety awareness, Difficulty walking, Increased edema, Increased muscle spasms, Impaired flexibility ? ?Visit Diagnosis: ?Pain in right hip ? ?Stiffness of right hip, not elsewhere classified ? ?Muscle weakness (generalized) ? ?Difficulty in walking, not elsewhere classified ? ? ? ? ?Problem List ?Patient Active Problem List  ? Diagnosis Date Noted  ? OSA (obstructive sleep apnea) 12/23/2020  ? History of COVID-19 10/04/2020  ? Memory loss 10/04/2020  ? Chronic respiratory failure with hypoxia ([facility]) 10/04/2020  ? Pneumonia due to COVID-19 virus 02/13/2020  ? Acute respiratory failure with hypoxia ([facility]) 02/13/2020  ? Obesity (BMI 30-39.9) 11/18/2017  ? Pure hypercholesterolemia 05/19/2017  ? Essential hypertension 04/21/2017  ? History of total hip arthroplasty, left 10/20/2013  ? Osteoarthritis of hip 06/14/2013  ? ? ?Jena GaussElizabeth J Zakai Gonyea, PT, DPT  ?08/20/2021, 11:56 AM ? ?[place] ?[facility] ?[address]  [address] ?[place], [place], [address] ?Phone: [phone]   Fax:  [phone] ? ?Name: Darren Wood ?MRN: [id number] ?Date of Birth: 03/01/1961 ? ? ? ?

## 2021-08-23 ENCOUNTER — Ambulatory Visit: Payer: BC Managed Care – PPO

## 2021-08-23 DIAGNOSIS — M25551 Pain in right hip: Secondary | ICD-10-CM | POA: Diagnosis not present

## 2021-08-23 DIAGNOSIS — M25651 Stiffness of right hip, not elsewhere classified: Secondary | ICD-10-CM

## 2021-08-23 DIAGNOSIS — M6281 Muscle weakness (generalized): Secondary | ICD-10-CM

## 2021-08-23 DIAGNOSIS — R262 Difficulty in walking, not elsewhere classified: Secondary | ICD-10-CM

## 2021-08-23 NOTE — Therapy (Signed)
[place] ?[facility] ?[address]  [address] ?[place], [place], [address] ?Phone: [phone]   Fax:  [phone] ? ?Physical Therapy Treatment ? ?Patient Details  ?Name: Darren Wood ?MRN: [id number] ?Date of Birth: 1960/05/22 ?Referring Provider (PT): Molly Maduro MD ? ? ?Encounter Date: 08/23/2021 ? ? PT End of Session - 08/23/21 1215   ? ? Visit Number 5   ? Number of Visits 16   ? Date for PT Re-Evaluation 10/02/21   ? Authorization Type BCBS   ? PT Start Time 1106   ? PT Stop Time 1149   ? PT Time Calculation (min) 43 min   ? Activity Tolerance Patient tolerated treatment well   ? Behavior During Therapy [facility] for tasks assessed/performed   ? ?  ?  ? ?  ? ? ?Past Medical History:  ?Diagnosis Date  ? Hyperlipidemia   ? Hypertension   ? ? ?Past Surgical History:  ?Procedure Laterality Date  ? REVISION TOTAL HIP ARTHROPLASTY    ? ? ?There were no vitals filed for this visit. ? ? Subjective Assessment - 08/23/21 1111   ? ? Subjective Pt notes some pain from the truck incident.   ? Pertinent History hypertension, obesity class II, dyslipidemia and DJD, long COVID, OSA   ? Patient Stated Goals "be back to some sort of normal, regular activities, walking without cane"   ? Currently in Pain? Yes   ? Pain Score 1    ? Pain Location Hip   ? Pain Orientation Right;Lateral   ? Pain Descriptors / Indicators Aching   ? Pain Type Surgical pain   ? ?  ?  ? ?  ? ? ? ? ? ? ? ? ? ? ? ? ? ? ? ? ? ? ? ? [facility] Adult PT Treatment/Exercise - 08/23/21 0001   ? ?  ? Knee/Hip Exercises: Standing  ? Terminal Knee Extension Strengthening;Right;15 reps;Theraband   ? Theraband Level (Terminal Knee Extension) Level 3 (Green)   ? Hip Abduction Stengthening;Both;10 reps;Knee straight;2 sets   ? Abduction Limitations red TB at knees   ? Hip Extension Stengthening;Both;10 reps;Knee straight;2 sets   ? Extension Limitations red TB at knees   ? Forward Step Up Right;2 sets;10 reps;Step Height: 4";Hand Hold:  2   ?  ? Manual Therapy  ? Manual Therapy Soft tissue mobilization;Myofascial release   ? Soft tissue mobilization IASTM with the stick to R hip flexors, decreased pain   ? Myofascial Release TPR to R proximal rectus femoris   ? ?  ?  ? ?  ? ? ? ? ? ? ? ? ? ? ? ? PT Short Term Goals - 08/13/21 1141   ? ?  ? PT SHORT TERM GOAL #1  ? Title Ind with initial HEP   ? Time 1   ? Period Weeks   ? Status Achieved   ? Target Date 08/14/21   ?  ? PT SHORT TERM GOAL #2  ? Title Demonstrate improved balance and decreased risk of falls by completing TUG <25 seconds   ? Baseline 30 sec with SPC   ? Time 4   ? Period Weeks   ? Status Achieved   ? ?  ?  ? ?  ? ? ? ? PT Long Term Goals - 08/09/21 1110   ? ?  ? PT LONG TERM GOAL #1  ? Title Ind with progressed HEP to improve functional outcomes.   ?  Time 8   ? Period Weeks   ? Status On-going   ? Target Date 10/02/21   ?  ? PT LONG TERM GOAL #2  ? Title Demonstrate improved functional ability as demonstrated by FOTO score of 75%.   ? Baseline 43%   ? Time 8   ? Period Weeks   ? Status On-going   ? Target Date 10/02/21   ?  ? PT LONG TERM GOAL #3  ? Title Be able to ascend/descend 14 stairs with HR, reciprocal step and no increase in R hip pain safely to access home.   ? Baseline increased pain 5-6/10 with stairs at home.   ? Time 8   ? Period Weeks   ? Status On-going   ? Target Date 10/02/21   ?  ? PT LONG TERM GOAL #4  ? Title Able to ambulate 900 ft safely without AD in 6 minutes to improve community access.   ? Baseline slow gait speed with SPC, limited to household distances   ? Time 8   ? Period Weeks   ? Status On-going   ? Target Date 10/02/21   ?  ? PT LONG TERM GOAL #5  ? Title Pt. will demonstrate decreased risk of falls as demonstrated by FGA >22/30   ? Baseline deferred today   ? Time 8   ? Period Weeks   ? Status On-going   ? Target Date 10/02/21   ? ?  ?  ? ?  ? ? ? ? ? ? ? ? Plan - 08/23/21 1215   ? ? Clinical Impression Statement Pt reports that all of his hardware  from [facility] is good but he still is experiencing muscular pain from recent incident where he tried to get into his truck. He tolerated the progression of exercises well today, however was limited by some SOB with the standing exercises and limited by hip flexor pain with attempted hs curls. Finished session with MT to reduce anterior thigh pain to improve activitiy tolerance.   ? Personal Factors and Comorbidities Comorbidity 3+   ? Comorbidities HTN, obesity, dyslipidemia, DJD, long COVID, OSA, DOE, L THA   ? PT Frequency 2x / week   ? PT Duration 8 weeks   ? PT Treatment/Interventions ADLs/Self Care Home Management;Cryotherapy;Electrical Stimulation;Moist Heat;Stair training;Functional mobility training;Therapeutic activities;Gait training;Therapeutic exercise;Balance training;Neuromuscular re-education;Patient/family education;Manual techniques;Dry needling;Taping;Passive range of motion;Joint Manipulations   ? PT Next Visit Plan progress hip strengthening, reinforce stairs up with good and down with bad prinicple, modalities PRN   ? PT Home Exercise Plan Access Code: [email or web address]   ? Consulted and Agree with Plan of Care Patient   ? ?  ?  ? ?  ? ? ?Patient will benefit from skilled therapeutic intervention in order to improve the following deficits and impairments:  Abnormal gait, Decreased activity tolerance, Decreased endurance, Decreased range of motion, Decreased skin integrity, Decreased strength, Increased fascial restricitons, Pain, Decreased balance, Decreased mobility, Decreased safety awareness, Difficulty walking, Increased edema, Increased muscle spasms, Impaired flexibility ? ?Visit Diagnosis: ?Pain in right hip ? ?Stiffness of right hip, not elsewhere classified ? ?Muscle weakness (generalized) ? ?Difficulty in walking, not elsewhere classified ? ? ? ? ?Problem List ?Patient Active Problem List  ? Diagnosis Date Noted  ? OSA (obstructive sleep apnea) 12/23/2020  ? History of COVID-19 10/04/2020  ? Memory  loss 10/04/2020  ? Chronic respiratory failure with hypoxia ([facility]) 10/04/2020  ? Pneumonia due to COVID-19 virus 02/13/2020  ? Acute  respiratory failure with hypoxia ([facility]) 02/13/2020  ? Obesity (BMI 30-39.9) 11/18/2017  ? Pure hypercholesterolemia 05/19/2017  ? Essential hypertension 04/21/2017  ? History of total hip arthroplasty, left 10/20/2013  ? Osteoarthritis of hip 06/14/2013  ? ? ?Darleene Cleaver, PTA ?08/23/2021, 12:21 PM ? ?[place] ?[facility] ?[address]  [address] ?[place], [place], [address] ?Phone: [phone]   Fax:  [phone] ? ?Name: Darren Wood ?MRN: [id number] ?Date of Birth: [birth year] ? ? ? ?

## 2021-08-27 ENCOUNTER — Ambulatory Visit: Payer: BC Managed Care – PPO | Admitting: Physical Therapy

## 2021-08-27 ENCOUNTER — Encounter: Payer: Self-pay | Admitting: Physical Therapy

## 2021-08-27 DIAGNOSIS — M25551 Pain in right hip: Secondary | ICD-10-CM | POA: Diagnosis not present

## 2021-08-27 DIAGNOSIS — M25651 Stiffness of right hip, not elsewhere classified: Secondary | ICD-10-CM

## 2021-08-27 DIAGNOSIS — M6281 Muscle weakness (generalized): Secondary | ICD-10-CM

## 2021-08-27 DIAGNOSIS — R262 Difficulty in walking, not elsewhere classified: Secondary | ICD-10-CM
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
## 2021-08-30 ENCOUNTER — Ambulatory Visit: Payer: BC Managed Care – PPO

## 2021-08-30 DIAGNOSIS — M25651 Stiffness of right hip, not elsewhere classified: Secondary | ICD-10-CM

## 2021-08-30 DIAGNOSIS — M25551 Pain in right hip: Secondary | ICD-10-CM

## 2021-08-30 DIAGNOSIS — R262 Difficulty in walking, not elsewhere classified: Secondary | ICD-10-CM

## 2021-08-30 DIAGNOSIS — M6281 Muscle weakness (generalized): Secondary | ICD-10-CM

## 2021-08-30 NOTE — Therapy (Signed)
[place] ?[facility] ?[address] ?[place], [place], [address] ?Phone: [phone]   Fax:  [phone] ? ?Physical Therapy Treatment ? ?Patient Details  ?Name: Darren Wood ?MRN: [id number] ?Date of Birth: Nov 16, 1960 ?Referring Provider (PT): Rosanne Ashing MD ? ? ?Encounter Date: 08/30/2021 ? ? PT End of Session - 08/30/21 1210   ? ? Visit Number 7   ? Number of Visits 16   ? Date for PT Re-Evaluation 10/02/21   ? Authorization Type BCBS   ? PT Start Time 1102   ? PT Stop Time 1144   ? PT Time Calculation (min) 42 min   ? Activity Tolerance Patient tolerated treatment well   ? Behavior During Therapy [facility] for tasks assessed/performed   ? ?  ?  ? ?  ? ? ?Past Medical History:  ?Diagnosis Date  ? Hyperlipidemia   ? Hypertension   ? ? ?Past Surgical History:  ?Procedure Laterality Date  ? REVISION TOTAL HIP ARTHROPLASTY    ? ? ?There were no vitals filed for this visit. ? ? Subjective Assessment - 08/30/21 1109   ? ? Subjective Pt reports a little hip pain today, the hip flexor discomfort has subsided.   ? Pertinent History hypertension, obesity class II, dyslipidemia and DJD, long COVID, OSA   ? Patient Stated Goals "be back to some sort of normal, regular activities, walking without cane"   ? Currently in Pain? Yes   ? Pain Score 2    ? Pain Location Hip   ? Pain Orientation Right   ? Pain Descriptors / Indicators Aching   ? Pain Type Surgical pain   ? ?  ?  ? ?  ? ? ? ? ? ? ? ? ? ? ? ? ? ? ? ? ? ? ? ? [facility] Adult PT Treatment/Exercise - 08/30/21 0001   ? ?  ? Ambulation/Gait  ? Ambulation Distance (Feet) 240 Feet   2 trials  ? Gait Comments w/o AD, limited by DOE, cues to increase stride length, decreased step length on L   ?  ? Exercises  ? Exercises Knee/Hip   ?  ? Knee/Hip Exercises: Stretches  ? Hip Flexor Stretch Right;2 reps;60 seconds   ? Hip Flexor Stretch Limitations standing runner stretch   ?  ? Knee/Hip Exercises: Aerobic  ? Nustep L5 x 6 min   ?  ?  Knee/Hip Exercises: Standing  ? Lateral Step Up Right;2 sets;10 reps;Hand Hold: 1;Step Height: 4"   ? Forward Step Up Right;2 sets;10 reps;Hand Hold: 2;Step Height: 4"   ? Forward Step Up Limitations cues to increase hip extension on step up   ? Functional Squat 2 sets;10 reps   ? ?  ?  ? ?  ? ? ? ? ? ? ? ? ? ? ? ? PT Short Term Goals - 08/13/21 1141   ? ?  ? PT SHORT TERM GOAL #1  ? Title Ind with initial HEP   ? Time 1   ? Period Weeks   ? Status Achieved   ? Target Date 08/14/21   ?  ? PT SHORT TERM GOAL #2  ? Title Demonstrate improved balance and decreased risk of falls by completing TUG <25 seconds   ? Baseline 30 sec with SPC   ? Time 4   ? Period Weeks   ? Status Achieved   ? ?  ?  ? ?  ? ? ? ? PT  Long Term Goals - 08/09/21 1110   ? ?  ? PT LONG TERM GOAL #1  ? Title Ind with progressed HEP to improve functional outcomes.   ? Time 8   ? Period Weeks   ? Status On-going   ? Target Date 10/02/21   ?  ? PT LONG TERM GOAL #2  ? Title Demonstrate improved functional ability as demonstrated by FOTO score of 75%.   ? Baseline 43%   ? Time 8   ? Period Weeks   ? Status On-going   ? Target Date 10/02/21   ?  ? PT LONG TERM GOAL #3  ? Title Be able to ascend/descend 14 stairs with HR, reciprocal step and no increase in R hip pain safely to access home.   ? Baseline increased pain 5-6/10 with stairs at home.   ? Time 8   ? Period Weeks   ? Status On-going   ? Target Date 10/02/21   ?  ? PT LONG TERM GOAL #4  ? Title Able to ambulate 900 ft safely without AD in 6 minutes to improve community access.   ? Baseline slow gait speed with SPC, limited to household distances   ? Time 8   ? Period Weeks   ? Status On-going   ? Target Date 10/02/21   ?  ? PT LONG TERM GOAL #5  ? Title Pt. will demonstrate decreased risk of falls as demonstrated by FGA >22/30   ? Baseline deferred today   ? Time 8   ? Period Weeks   ? Status On-going   ? Target Date 10/02/21   ? ?  ?  ? ?  ? ? ? ? ? ? ? ? Plan - 08/30/21 1210   ? ? Clinical  Impression Statement Pt responded well to treatment, just continues requiring frequent rest breaks with ther ex due to SOB. He demonstrates reluctance to fully accept weight onto R LE with step up. Progressed gait training w/o AD today, we worked on improving symmetrical step length and heel toe pattern along with improving activity tolerance. Continue progressing exercises.   ? Personal Factors and Comorbidities Comorbidity 3+   ? Comorbidities HTN, obesity, dyslipidemia, DJD, long COVID, OSA, DOE, L THA   ? PT Frequency 2x / week   ? PT Duration 8 weeks   ? PT Treatment/Interventions ADLs/Self Care Home Management;Cryotherapy;Electrical Stimulation;Moist Heat;Stair training;Functional mobility training;Therapeutic activities;Gait training;Therapeutic exercise;Balance training;Neuromuscular re-education;Patient/family education;Manual techniques;Dry needling;Taping;Passive range of motion;Joint Manipulations   ? PT Next Visit Plan progress hip strengthening, step ups, modalities PRN   ? PT Home Exercise Plan Access Code: [email or web address]   ? Consulted and Agree with Plan of Care Patient   ? ?  ?  ? ?  ? ? ?Patient will benefit from skilled therapeutic intervention in order to improve the following deficits and impairments:  Abnormal gait, Decreased activity tolerance, Decreased endurance, Decreased range of motion, Decreased skin integrity, Decreased strength, Increased fascial restricitons, Pain, Decreased balance, Decreased mobility, Decreased safety awareness, Difficulty walking, Increased edema, Increased muscle spasms, Impaired flexibility ? ?Visit Diagnosis: ?Pain in right hip ? ?Stiffness of right hip, not elsewhere classified ? ?Muscle weakness (generalized) ? ?Difficulty in walking, not elsewhere classified ? ? ? ? ?Problem List ?Patient Active Problem List  ? Diagnosis Date Noted  ? OSA (obstructive sleep apnea) 12/23/2020  ? History of COVID-19 10/04/2020  ? Memory loss 10/04/2020  ? Chronic respiratory failure  with hypoxia ([place]) 10/04/2020  ? Pneumonia  due to COVID-19 virus 02/13/2020  ? Acute respiratory failure with hypoxia ([place]) 02/13/2020  ? Obesity (BMI 30-39.9) 11/18/2017  ? Pure hypercholesterolemia 05/19/2017  ? Essential hypertension 04/21/2017  ? History of total hip arthroplasty, left 10/20/2013  ? Osteoarthritis of hip 06/14/2013  ? ? ?Artist Pais, PTA ?08/30/2021, 12:22 PM ? ?[place] ?[facility] ?[address] ?[place], [place], [address] ?Phone: [phone]   Fax:  [phone] ? ?Name: Darren Wood ?MRN: [id number] ?Date of Birth: 09-21-1960 ? ? ? ?

## 2021-09-03 ENCOUNTER — Ambulatory Visit: Payer: BC Managed Care – PPO | Admitting: Physical Therapy

## 2021-09-06 ENCOUNTER — Ambulatory Visit: Payer: BC Managed Care – PPO | Attending: Adult Reconstructive Orthopaedic Surgery

## 2021-09-06 DIAGNOSIS — M6281 Muscle weakness (generalized): Secondary | ICD-10-CM | POA: Insufficient documentation

## 2021-09-06 DIAGNOSIS — M25551 Pain in right hip: Secondary | ICD-10-CM | POA: Insufficient documentation

## 2021-09-06 DIAGNOSIS — M25651 Stiffness of right hip, not elsewhere classified: Secondary | ICD-10-CM | POA: Insufficient documentation

## 2021-09-06 DIAGNOSIS — R262 Difficulty in walking, not elsewhere classified: Secondary | ICD-10-CM | POA: Insufficient documentation
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
## 2021-09-10 ENCOUNTER — Encounter: Payer: Self-pay | Admitting: Physical Therapy

## 2021-09-10 ENCOUNTER — Ambulatory Visit: Payer: BC Managed Care – PPO | Admitting: Physical Therapy

## 2021-09-10 DIAGNOSIS — M6281 Muscle weakness (generalized): Secondary | ICD-10-CM

## 2021-09-10 DIAGNOSIS — M25651 Stiffness of right hip, not elsewhere classified: Secondary | ICD-10-CM

## 2021-09-10 DIAGNOSIS — M25551 Pain in right hip: Secondary | ICD-10-CM | POA: Diagnosis not present

## 2021-09-10 DIAGNOSIS — R262 Difficulty in walking, not elsewhere classified: Secondary | ICD-10-CM

## 2021-09-10 NOTE — Therapy (Addendum)
?OUTPATIENT PHYSICAL THERAPY TREATMENT NOTE ? ? ?Patient Name: Darren CandelaMaurice Wood ?MRN: [id number] ?DOB:07/14/1960, 61 y.o., male ?Today's Date: 09/10/2021 ? ?PCP: Loyal JacobsonKalish, Michael, MD ?REFERRING PROVIDER: Molly MaduroShields, John MD ? ? PT End of Session - 09/10/21 1114   ? ? Visit Number 9   ? Number of Visits 16   ? Date for PT Re-Evaluation 10/02/21   ? Authorization Type BCBS   ? Progress Note Due on Visit 10   ? PT Start Time 1105   ? PT Stop Time 1149   ? PT Time Calculation (min) 44 min   ? Activity Tolerance Patient tolerated treatment well   ? Behavior During Therapy [facility] for tasks assessed/performed   ? ?  ?  ? ?  ? ? ?Past Medical History:  ?Diagnosis Date  ? Hyperlipidemia   ? Hypertension   ? ?Past Surgical History:  ?Procedure Laterality Date  ? REVISION TOTAL HIP ARTHROPLASTY    ? ?Patient Active Problem List  ? Diagnosis Date Noted  ? OSA (obstructive sleep apnea) 12/23/2020  ? History of COVID-19 10/04/2020  ? Memory loss 10/04/2020  ? Chronic respiratory failure with hypoxia (HCC) 10/04/2020  ? Pneumonia due to COVID-19 virus 02/13/2020  ? Acute respiratory failure with hypoxia (HCC) 02/13/2020  ? Obesity (BMI 30-39.9) 11/18/2017  ? Pure hypercholesterolemia 05/19/2017  ? Essential hypertension 04/21/2017  ? History of total hip arthroplasty, left 10/20/2013  ? Osteoarthritis of hip 06/14/2013  ? ? ?REFERRING DIAG: Z96.649 (ICD-10-CM) - S/P R hip replacement ? ?THERAPY DIAG:  ?Pain in right hip ? ?Stiffness of right hip, not elsewhere classified ? ?Muscle weakness (generalized) ? ?Difficulty in walking, not elsewhere classified ? ?PERTINENT HISTORY: hypertension, obesity class II, dyslipidemia and DJD, long COVID, OSA  ? ?PRECAUTIONS: posterior hip precautions ? ?SUBJECTIVE: Still having the pain from jumping up fast when wife was screaming outside on Monday.  Feels clicking in hip.  ? ?PAIN:  ?Are you having pain? Yes: NPRS scale: 2/10 ?Pain location: R hip ? ? ?OBJECTIVE: (objective measures completed at initial  evaluation unless otherwise dated) ?Focus on Therapeutic Outcomes (FOTO)  hip: 43%, predicted outcome after 14 visits 75%   ?   ?ROM / Strength  ?AROM / PROM / Strength AROM;Strength   ?   ?AROM  ?Overall AROM  Deficits;Due to precautions   ?Overall AROM Comments R hip deficits due to precautions, not formally measured today   ?   ?Strength  ?Overall Strength Deficits;Due to precautions;Due to pain   ?Overall Strength Comments decreased R hip strength, 3/5 grossly.   5/5 bil quad strength and ankle DF strength.   ?   ?Transfers  ?Transfers Sit to Stand   ?Sit to Stand 6: Modified independent (Device/Increase time)   ?Comments keeps RLE extended due to precautions, UE assist.   ?   ?Ambulation/Gait  ?Ambulation/Gait Yes   ?Ambulation/Gait Assistance 6: Modified independent (Device/Increase time)   ?Ambulation Distance (Feet) 50 Feet   ?Assistive device Straight cane   ?Gait Pattern Step-through pattern;Decreased stance time - right;Decreased weight shift to right   ?Gait velocity 0.46 m/s or 1.5 ft/sec   ?   ?Balance  ?Balance Assessed Yes   ?   ?Standardized Balance Assessment  ?Standardized Balance Assessment Timed Up and Go Test   ?   ?Timed Up and Go Test  ?TUG Normal TUG   ?Normal TUG (seconds) 30   ?TUG Comments with SPC   ? ? ?TODAY'S TREATMENT:  ?09/10/2021 ?Therapeutic Exercise: to improve strength  and mobility.  Verbal and tactile cues throughout for technique. ?Gait x 600' with [facility] for warm-up ?Steps ups (1 riser) x 2 x 10 bil. Noted ITBand popping over R hip with stepping up.  Note painful.  ?Seated rest breaks needed between sets due to dyspnea.  ? ?Manual therapy: to decrease spasm and tightness in R hip.  ?STM/TPR to R glut med, piriformis, TFL ?Skilled palpation and monitoring during dry needling.   ? ?Trigger Point Dry-Needling  ?Treatment instructions: Expect mild to moderate muscle soreness. S/S of pneumothorax if dry needled over a lung field, and to seek immediate medical attention should they  occur. Patient verbalized understanding of these instructions and education. ? ?Patient Consent Given: Yes ?Education handout provided: Yes ?Muscles treated: R glut med, piriformis, TFL ?Electrical stimulation performed: No ?Parameters: N/A ?Treatment response/outcome: Twitch Response Elicited and Palpable Increase in Muscle Length ? ?PATIENT EDUCATION: ?Education details:  DN rational, procedure, outcomes, potential side effects, and recommended post-treatment exercises/activity ?Person educated: Patient ?Education method: Explanation and Handouts ?Education comprehension: verbalized understanding ? ? ?HOME EXERCISE PROGRAM: ?[id number] ? ?ASSESSMENT: ?Clinical Impression: ?Darren Wood reports continued clicking in R hip after moving suddenly at home, able to palpate and appeared to be ITBand snapping over hip.   He did not have increased pain today and no difficulty bearing weight on R hip.  He tolerated gait x 600' limited by dyspnea on exertion, and did not have increased pain with step ups on R, but again had constant clicking.  Discussed trial of  dry needling along with manual therapy to area to decrease tightness, after education patient consented to trial (with stipulation he did not see needles), and tolerated very well, reporting decreased tightness following but still clicking in R hip.   Otherwise making good progress towards goals and FOTO has improved to 52%.  He will contact referring MD to see if needs to be seen sooner for follow-up.  ? ? ?   ? ?  ? PT SHORT TERM GOAL #1  ? Title Ind with initial HEP   ? Time 1   ? Period Weeks   ? Status Achieved   ? Target Date 08/14/21   ?  ? PT SHORT TERM GOAL #2  ? Title Demonstrate improved balance and decreased risk of falls by completing TUG <25 seconds   ? Baseline 30 sec with SPC   ? Time 4   ? Period Weeks   ? Status Achieved   ? ?  ?  ? ?  ? ? ? PT Long Term Goals - 09/10/21 1116   ? ?  ? PT LONG TERM GOAL #1  ? Title Ind with progressed HEP to improve  functional outcomes.   ? Time 8   ? Period Weeks   ? Status On-going   ? Target Date 10/02/21   ?  ? PT LONG TERM GOAL #2  ? Title Demonstrate improved functional ability as demonstrated by FOTO score of 75%.   ? Baseline 43%   ? Time 8   ? Period Weeks   ? Status On-going   09/10/21- 52%  ? Target Date 10/02/21   ?  ? PT LONG TERM GOAL #3  ? Title Be able to ascend/descend 14 stairs with HR, reciprocal step and no increase in R hip pain safely to access home.   ? Baseline increased pain 5-6/10 with stairs at home.   ? Time 8   ? Period Weeks   ? Status On-going   ?  Target Date 10/02/21   ?  ? PT LONG TERM GOAL #4  ? Title Able to ambulate 900 ft safely without AD in 6 minutes to improve community access.   ? Baseline slow gait speed with SPC, limited to household distances   ? Time 8   ? Period Weeks   ? Status On-going   09/10/21- 600' with SPC, DOE  ? Target Date 10/02/21   ?  ? PT LONG TERM GOAL #5  ? Title Pt. will demonstrate decreased risk of falls as demonstrated by FGA >22/30   ? Baseline deferred today   ? Time 8   ? Period Weeks   ? Status On-going   ? Target Date 10/02/21   ? ?  ?  ? ?  ? ?PLAN: ?PT Frequency 2x / week   ?PT Duration 8 weeks   ?PT Treatment/Interventions ADLs/Self Care Home Management;Cryotherapy;Electrical Stimulation;Moist Heat;Stair training;Functional mobility training;Therapeutic activities;Gait training;Therapeutic exercise;Balance training;Neuromuscular re-education;Patient/family education;Manual techniques;Dry needling;Taping;Passive range of motion;Joint Manipulations   ?PT Next Visit Plan Progress note, (DGI) continue strengthening, modalities PRN  ?  ?   ?  ?  ?   ?  ?  ?Patient will benefit from skilled therapeutic intervention in order to improve the following deficits and impairments:  Abnormal gait, Decreased activity tolerance, Decreased endurance, Decreased range of motion, Decreased skin integrity, Decreased strength, Increased fascial restricitons, Pain, Decreased  balance, Decreased mobility, Decreased safety awareness, Difficulty walking, Increased edema, Increased muscle spasms, Impaired flexibility ? ? ? ? ?Jena Gauss, PT, DPT  ?09/10/2021, 11:53 AM ? ?   ?

## 2021-09-10 NOTE — Patient Instructions (Signed)

## 2021-09-13 ENCOUNTER — Encounter: Payer: Self-pay | Admitting: Physical Therapy

## 2021-09-13 ENCOUNTER — Ambulatory Visit: Payer: BC Managed Care – PPO | Admitting: Physical Therapy

## 2021-09-13 DIAGNOSIS — R262 Difficulty in walking, not elsewhere classified: Secondary | ICD-10-CM

## 2021-09-13 DIAGNOSIS — M25551 Pain in right hip: Secondary | ICD-10-CM

## 2021-09-13 DIAGNOSIS — M6281 Muscle weakness (generalized): Secondary | ICD-10-CM

## 2021-09-13 DIAGNOSIS — M25651 Stiffness of right hip, not elsewhere classified: Secondary | ICD-10-CM

## 2021-09-13 NOTE — Therapy (Signed)
?OUTPATIENT PHYSICAL THERAPY TREATMENT NOTE ? ?Progress Note ?Reporting Period 08/07/2021 to 09/13/2021 ? ?See note below for Objective Data and Assessment of Progress/Goals.  ? ?  ? ?Patient Name: Darren Wood ?MRN: [id number] ?DOB:1961-01-23, 61 y.o., male ?Today's Date: 09/13/2021 ? ?PCP: Loyal Jacobson, MD ?REFERRING PROVIDER: Molly Maduro MD ? ? PT End of Session - 09/13/21 1101   ? ? Visit Number 10   ? Number of Visits 16   ? Date for PT Re-Evaluation 10/02/21   ? Authorization Type BCBS   ? Progress Note Due on Visit 10   ? PT Start Time 1101   ? PT Stop Time 1142   ? PT Time Calculation (min) 41 min   ? Activity Tolerance Patient tolerated treatment well   ? Behavior During Therapy [facility] for tasks assessed/performed   ? ?  ?  ? ?  ? ? ?Past Medical History:  ?Diagnosis Date  ? Hyperlipidemia   ? Hypertension   ? ?Past Surgical History:  ?Procedure Laterality Date  ? REVISION TOTAL HIP ARTHROPLASTY    ? ?Patient Active Problem List  ? Diagnosis Date Noted  ? OSA (obstructive sleep apnea) 12/23/2020  ? History of COVID-19 10/04/2020  ? Memory loss 10/04/2020  ? Chronic respiratory failure with hypoxia (HCC) 10/04/2020  ? Pneumonia due to COVID-19 virus 02/13/2020  ? Acute respiratory failure with hypoxia (HCC) 02/13/2020  ? Obesity (BMI 30-39.9) 11/18/2017  ? Pure hypercholesterolemia 05/19/2017  ? Essential hypertension 04/21/2017  ? History of total hip arthroplasty, left 10/20/2013  ? Osteoarthritis of hip 06/14/2013  ? ? ?REFERRING DIAG: Z96.649 (ICD-10-CM) - S/P R hip replacement ? ?THERAPY DIAG:  ?Pain in right hip ? ?Stiffness of right hip, not elsewhere classified ? ?Muscle weakness (generalized) ? ?Difficulty in walking, not elsewhere classified ? ?PERTINENT HISTORY: hypertension, obesity class II, dyslipidemia and DJD, long COVID, OSA  ? ?PRECAUTIONS: posterior hip precautions ? ?SUBJECTIVE:  Feels better and better.  Feels that he is doing more and even his breathing is better.   ? ?PAIN:  ?Are you  having pain? Yes: NPRS scale: 1/10 ?Pain location: R hip ? ? ?OBJECTIVE: (objective measures completed at initial evaluation unless otherwise dated) ?Focus on Therapeutic Outcomes (FOTO)  hip: 43%, predicted outcome after 14 visits 75%  09/13/21 ?FOTO 52% (09/10/2021)   ?     ?ROM / Strength    ?AROM / PROM / Strength AROM;Strength     ?     ?AROM    ?Overall AROM  Deficits;Due to precautions     ?Overall AROM Comments R hip deficits due to precautions, not formally measured today     ?     ?Strength    ?Overall Strength Deficits;Due to precautions;Due to pain     ?Overall Strength Comments decreased R hip strength, 3/5 grossly.   5/5 bil quad strength and ankle DF strength.  3+/5 R hip flexor weakness.   ?     ?Transfers    ?Transfers Sit to Stand  5x sit to stand test : 15 sec, no UE support.    ?Sit to Stand 6: Modified independent (Device/Increase time)     ?Comments keeps RLE extended due to precautions, UE assist.     ?     ?Ambulation/Gait    ?Ambulation/Gait Yes  Yes   ?Ambulation/Gait Assistance 6: Modified independent (Device/Increase time)  7: Independent   ?Ambulation Distance (Feet) 50 Feet  625   ?Assistive device Straight cane  No device   ?  Gait Pattern Step-through pattern;Decreased stance time - right;Decreased weight shift to right     ?Gait velocity 0.46 m/s or 1.5 ft/sec  0.84 m/s   ?     ?Balance    ?Balance Assessed Yes     ?     ?Standardized Balance Assessment    ?Standardized Balance Assessment Timed Up and Go Test  FGA 24/30   ?     ?Timed Up and Go Test    ?TUG Normal TUG     ?Normal TUG (seconds) 30     ?TUG Comments with SPC     ? ? ?TODAY'S TREATMENT:  ?09/12/21 Therapeutic Exercise: to improve strength and mobility.  Verbal and tactile cues throughout for technique. ?Nustep level 6 x 6 min ?FGA; 5x STS, seated marches x 10 bil, seated R side steps x 10 (tolerated better than flexion), standing hip flexion x 10 R, standing hip marches x 10 R, seated hip flexion x 20. Gait x 6 min (625'  total without device, carrying SPC, DOE) ? ?09/10/2021 ?Therapeutic Exercise: to improve strength and mobility.  Verbal and tactile cues throughout for technique. ?Gait x 600' with [facility] for warm-up ?Steps ups (1 riser) x 2 x 10 bil. Noted ITBand popping over R hip with stepping up.  Note painful.  ?Seated rest breaks needed between sets due to dyspnea.  ? ?Manual therapy: to decrease spasm and tightness in R hip.  ?STM/TPR to R glut med, piriformis, TFL ?Skilled palpation and monitoring during dry needling.   ? ?Trigger Point Dry-Needling  ?Treatment instructions: Expect mild to moderate muscle soreness. S/S of pneumothorax if dry needled over a lung field, and to seek immediate medical attention should they occur. Patient verbalized understanding of these instructions and education. ? ?Patient Consent Given: Yes ?Education handout provided: Yes ?Muscles treated: R glut med, piriformis, TFL ?Electrical stimulation performed: No ?Parameters: N/A ?Treatment response/outcome: Twitch Response Elicited and Palpable Increase in Muscle Length ? ?PATIENT EDUCATION: ?Education details:  HEP update ?Person educated: Patient ?Education method: Explanation, Demonstration, Verbal cues, and Handouts ?Education comprehension: verbalized understanding and returned demonstration ? ? ?HOME EXERCISE PROGRAM: ?[id number] ? ?Exercises Added ?- Standing Marching  - 1 x daily - 7 x weekly - 2-3 sets - 10 reps ?- Standing Hip Flexion with Counter Support  - 1 x daily - 7 x weekly - 2-3 sets - 10 reps ?- Seated Hip Flexion  - 1 x daily - 7 x weekly - 2 sets - 10 reps ?- Seated Active Hip Flexion  - 1 x daily - 7 x weekly - 2 sets - 10 reps ? ?ASSESSMENT: ?Clinical Impression: ?Darren Wood is making excellent progress.  He is reporting less R hip flexor pain but still has weakness.  He reported that seated hip flexion helped stretch this muscle today.  He scored 24/30 on FGA demonstrating low risk of falls and meeting LTG#5.   His gait speed has  improved and he is able to walk 625' without AD today, limited mostly by dyspnea.  He would benefit from continued skilled therapy.   ?   ? ?  ? PT SHORT TERM GOAL #1  ? Title Ind with initial HEP   ? Time 1   ? Period Weeks   ? Status Achieved   ? Target Date 08/14/21   ?  ? PT SHORT TERM GOAL #2  ? Title Demonstrate improved balance and decreased risk of falls by completing TUG <25 seconds   ? Baseline  30 sec with SPC   ? Time 4   ? Period Weeks   ? Status Achieved   ? ?  ?  ? ?  ? ? ? PT Long Term Goals - 09/13/21 1108   ? ?  ? PT LONG TERM GOAL #1  ? Title Ind with progressed HEP to improve functional outcomes.   ? Time 8   ? Period Weeks   ? Status On-going   09/13/21- good compliance  ? Target Date 10/02/21   ?  ? PT LONG TERM GOAL #2  ? Title Demonstrate improved functional ability as demonstrated by FOTO score of 75%.   ? Baseline 43%   ? Time 8   ? Period Weeks   ? Status On-going   09/10/21- 52%  ? Target Date 10/02/21   ?  ? PT LONG TERM GOAL #3  ? Title Be able to ascend/descend 14 stairs with HR, reciprocal step and no increase in R hip pain safely to access home.   ? Baseline increased pain 5-6/10 with stairs at home.   ? Time 8   ? Period Weeks   ? Status On-going   09/13/21- step to gait still due to increased R hip pain with reciprocal step.  ? Target Date 10/02/21   ?  ? PT LONG TERM GOAL #4  ? Title Able to ambulate 900 ft safely without AD in 6 minutes to improve community access.   ? Baseline slow gait speed with SPC, limited to household distances   ? Time 8   ? Period Weeks   ? Status On-going   09/10/21- 600' with SPC, DOE  ? Target Date 10/02/21   ?  ? PT LONG TERM GOAL #5  ? Title Pt. will demonstrate decreased risk of falls as demonstrated by FGA >22/30   ? Baseline deferred today   ? Time 8   ? Period Weeks   ? Status Achieved   09/13/21- 24/30  ? Target Date 10/02/21   ? ?  ?  ? ?  ? ?PLAN: ?PT Frequency 2x / week   ?PT Duration 8 weeks   ?PT Treatment/Interventions ADLs/Self Care Home  Management;Cryotherapy;Electrical Stimulation;Moist Heat;Stair training;Functional mobility training;Therapeutic activities;Gait training;Therapeutic exercise;Balance training;Neuromuscular re-education;Patient/famil

## 2021-09-17 ENCOUNTER — Ambulatory Visit: Payer: BC Managed Care – PPO | Admitting: Physical Therapy

## 2021-09-17 ENCOUNTER — Encounter: Payer: Self-pay | Admitting: Physical Therapy

## 2021-09-17 DIAGNOSIS — R262 Difficulty in walking, not elsewhere classified: Secondary | ICD-10-CM

## 2021-09-17 DIAGNOSIS — M25551 Pain in right hip: Secondary | ICD-10-CM | POA: Diagnosis not present

## 2021-09-17 DIAGNOSIS — M25651 Stiffness of right hip, not elsewhere classified: Secondary | ICD-10-CM

## 2021-09-17 DIAGNOSIS — M6281 Muscle weakness (generalized): Secondary | ICD-10-CM

## 2021-09-17 NOTE — Therapy (Signed)
?OUTPATIENT PHYSICAL THERAPY TREATMENT NOTE ? ?Progress Note ?Reporting Period 08/07/2021 to 09/13/2021 ? ?See note below for Objective Data and Assessment of Progress/Goals.  ? ?  ? ?Patient Name: Darren Wood ?MRN: [id number] ?DOB:1961-04-08, 61 y.o., male ?Today's Date: 09/17/2021 ? ?PCP: Loyal Jacobson, MD ?REFERRING PROVIDER: Molly Maduro MD ? ? PT End of Session - 09/17/21 1103   ? ? Visit Number 11   ? Number of Visits 16   ? Date for PT Re-Evaluation 10/02/21   ? Authorization Type BCBS   ? Progress Note Due on Visit 10   ? PT Start Time 1103   ? PT Stop Time 1145   ? PT Time Calculation (min) 42 min   ? Activity Tolerance Patient tolerated treatment well   ? Behavior During Therapy [facility] for tasks assessed/performed   ? ?  ?  ? ?  ? ? ?Past Medical History:  ?Diagnosis Date  ? Hyperlipidemia   ? Hypertension   ? ?Past Surgical History:  ?Procedure Laterality Date  ? REVISION TOTAL HIP ARTHROPLASTY    ? ?Patient Active Problem List  ? Diagnosis Date Noted  ? OSA (obstructive sleep apnea) 12/23/2020  ? History of COVID-19 10/04/2020  ? Memory loss 10/04/2020  ? Chronic respiratory failure with hypoxia (HCC) 10/04/2020  ? Pneumonia due to COVID-19 virus 02/13/2020  ? Acute respiratory failure with hypoxia (HCC) 02/13/2020  ? Obesity (BMI 30-39.9) 11/18/2017  ? Pure hypercholesterolemia 05/19/2017  ? Essential hypertension 04/21/2017  ? History of total hip arthroplasty, left 10/20/2013  ? Osteoarthritis of hip 06/14/2013  ? ? ?REFERRING DIAG: Z96.649 (ICD-10-CM) - S/P R hip replacement ? ?THERAPY DIAG:  ?Pain in right hip ? ?Stiffness of right hip, not elsewhere classified ? ?Muscle weakness (generalized) ? ?Difficulty in walking, not elsewhere classified ? ?PERTINENT HISTORY: hypertension, obesity class II, dyslipidemia and DJD, long COVID, OSA  ? ?PRECAUTIONS: posterior hip precautions ? ?SUBJECTIVE:  Pt. Reports not having hip pain anymore, but still has occasional clicking in R hip.   ? ?PAIN:  ?Are you having  pain? Yes: NPRS scale: 0/10 ?Pain location: R hip ? ? ?OBJECTIVE: (objective measures completed at initial evaluation unless otherwise dated) ?Focus on Therapeutic Outcomes (FOTO)  hip: 43%, predicted outcome after 14 visits 75%  09/13/21 ?FOTO 52% (09/10/2021)   ?     ?ROM / Strength    ?AROM / PROM / Strength AROM;Strength     ?     ?AROM    ?Overall AROM  Deficits;Due to precautions     ?Overall AROM Comments R hip deficits due to precautions, not formally measured today     ?     ?Strength    ?Overall Strength Deficits;Due to precautions;Due to pain     ?Overall Strength Comments decreased R hip strength, 3/5 grossly.   5/5 bil quad strength and ankle DF strength.  3+/5 R hip flexor weakness.   ?     ?Transfers    ?Transfers Sit to Stand  5x sit to stand test : 15 sec, no UE support.    ?Sit to Stand 6: Modified independent (Device/Increase time)     ?Comments keeps RLE extended due to precautions, UE assist.     ?     ?Ambulation/Gait    ?Ambulation/Gait Yes  Yes   ?Ambulation/Gait Assistance 6: Modified independent (Device/Increase time)  7: Independent   ?Ambulation Distance (Feet) 50 Feet  625   ?Assistive device Straight cane  No device   ?Gait Pattern  Step-through pattern;Decreased stance time - right;Decreased weight shift to right     ?Gait velocity 0.46 m/s or 1.5 ft/sec  0.84 m/s   ?     ?Balance    ?Balance Assessed Yes     ?     ?Standardized Balance Assessment    ?Standardized Balance Assessment Timed Up and Go Test  FGA 24/30   ?     ?Timed Up and Go Test    ?TUG Normal TUG     ?Normal TUG (seconds) 30     ?TUG Comments with SPC     ? ? ?TODAY'S TREATMENT:  ?09/17/21 Therapeutic Exercise: to improve strength and mobility.  Verbal and tactile cues throughout for technique. ?Gait x 950' with SPC for warm-up ?Star excursion pattern -forward, side, back steps 2 x 10 bil  ?Step ups (1 riser) 2 x 10 bil - UE support ?Side step ups (1 riser) 2 x 10 ? bil  ?Step down (no riser) 2 x 15 bil ?Seated hip flexion x  10, seated hip abduction x10  ? ?09/12/21 Therapeutic Exercise: to improve strength and mobility.  Verbal and tactile cues throughout for technique. ?Nustep level 6 x 6 min ?FGA; 5x STS, seated marches x 10 bil, seated R side steps x 10 (tolerated better than flexion), standing hip flexion x 10 R, standing hip marches x 10 R, seated hip flexion x 20. Gait x 6 min (625' total without device, carrying SPC, DOE) ? ?09/10/2021 ?Therapeutic Exercise: to improve strength and mobility.  Verbal and tactile cues throughout for technique. ?Gait x 600' with [facility] for warm-up ?Steps ups (1 riser) x 2 x 10 bil. Noted ITBand popping over R hip with stepping up.  Note painful.  ?Seated rest breaks needed between sets due to dyspnea.  ? ?Manual therapy: to decrease spasm and tightness in R hip.  ?STM/TPR to R glut med, piriformis, TFL ?Skilled palpation and monitoring during dry needling.   ? ?Trigger Point Dry-Needling  ?Treatment instructions: Expect mild to moderate muscle soreness. S/S of pneumothorax if dry needled over a lung field, and to seek immediate medical attention should they occur. Patient verbalized understanding of these instructions and education. ? ?Patient Consent Given: Yes ?Education handout provided: Yes ?Muscles treated: R glut med, piriformis, TFL ?Electrical stimulation performed: No ?Parameters: N/A ?Treatment response/outcome: Twitch Response Elicited and Palpable Increase in Muscle Length ? ?PATIENT EDUCATION: ?Education details:  HEP update ?Person educated: Patient ?Education method: Explanation, Demonstration, Verbal cues, and Handouts ?Education comprehension: verbalized understanding and returned demonstration ? ? ?HOME EXERCISE PROGRAM: ?[id number] ? ? ?ASSESSMENT: ?Clinical Impression: ?Darren Wood is making good progress, reporting significant improvement in R hip pain and decreased clicking today.  He was able to tolerate progression of exercises, but still needed frequent seated rest breaks due to  shortness of breath.  He would benefit from continued skilled therapy.  ?   ? ?  ? PT SHORT TERM GOAL #1  ? Title Ind with initial HEP   ? Time 1   ? Period Weeks   ? Status Achieved   ? Target Date 08/14/21   ?  ? PT SHORT TERM GOAL #2  ? Title Demonstrate improved balance and decreased risk of falls by completing TUG <25 seconds   ? Baseline 30 sec with SPC   ? Time 4   ? Period Weeks   ? Status Achieved   ? ?  ?  ? ?  ? ? ? PT Long Term Goals -  09/13/21 1108   ? ?  ? PT LONG TERM GOAL #1  ? Title Ind with progressed HEP to improve functional outcomes.   ? Time 8   ? Period Weeks   ? Status On-going   09/13/21- good compliance  ? Target Date 10/02/21   ?  ? PT LONG TERM GOAL #2  ? Title Demonstrate improved functional ability as demonstrated by FOTO score of 75%.   ? Baseline 43%   ? Time 8   ? Period Weeks   ? Status On-going   09/10/21- 52%  ? Target Date 10/02/21   ?  ? PT LONG TERM GOAL #3  ? Title Be able to ascend/descend 14 stairs with HR, reciprocal step and no increase in R hip pain safely to access home.   ? Baseline increased pain 5-6/10 with stairs at home.   ? Time 8   ? Period Weeks   ? Status On-going   09/13/21- step to gait still due to increased R hip pain with reciprocal step.  ? Target Date 10/02/21   ?  ? PT LONG TERM GOAL #4  ? Title Able to ambulate 900 ft safely without AD in 6 minutes to improve community access.   ? Baseline slow gait speed with SPC, limited to household distances   ? Time 8   ? Period Weeks   ? Status On-going   09/10/21- 600' with SPC, DOE  ? Target Date 10/02/21   ?  ? PT LONG TERM GOAL #5  ? Title Pt. will demonstrate decreased risk of falls as demonstrated by FGA >22/30   ? Baseline deferred today   ? Time 8   ? Period Weeks   ? Status Achieved   09/13/21- 24/30  ? Target Date 10/02/21   ? ?  ?  ? ?  ? ?PLAN: ?PT Frequency 2x / week   ?PT Duration 8 weeks   ?PT Treatment/Interventions ADLs/Self Care Home Management;Cryotherapy;Electrical Stimulation;Moist Heat;Stair  training;Functional mobility training;Therapeutic activities;Gait training;Therapeutic exercise;Balance training;Neuromuscular re-education;Patient/family education;Manual techniques;Dry needling;Taping;Passi

## 2021-09-20 ENCOUNTER — Ambulatory Visit: Payer: BC Managed Care – PPO

## 2021-09-20 DIAGNOSIS — M6281 Muscle weakness (generalized): Secondary | ICD-10-CM

## 2021-09-20 DIAGNOSIS — M25651 Stiffness of right hip, not elsewhere classified: Secondary | ICD-10-CM

## 2021-09-20 DIAGNOSIS — M25551 Pain in right hip: Secondary | ICD-10-CM | POA: Diagnosis not present

## 2021-09-20 DIAGNOSIS — R262 Difficulty in walking, not elsewhere classified: Secondary | ICD-10-CM

## 2021-09-20 NOTE — Therapy (Addendum)
PHYSICAL THERAPY DISCHARGE SUMMARY  Visits from Start of Care: 12  Current functional level related to goals / functional outcomes: Improved gait and balance, decreased pain. FGI 24/30 indicating decreased risk of falls.     Remaining deficits: Dyspnea with exertion, decreased tolerance to prolonged walking and difficulty on stairs.    Education / Equipment: HEP Plan: Patient agrees to discharge.  Patient is being discharged due to meeting the stated rehab goals.  Patient reports doing well and no longer needing PT.  Rennie Natter, PT, DPT 8:59 AM 10/18/2021      OUTPATIENT PHYSICAL THERAPY TREATMENT NOTE  Rationale for Evaluation and Treatment Rehabilitation       Patient Name: Darren Wood MRN: [id number] DOB:08/26/1960, 61 y.o., male Today's Date: 09/20/2021  PCP: Jefm Petty, MD REFERRING PROVIDER: Rosanne Ashing MD   PT End of Session - 09/20/21 1152     Visit Number 12    Number of Visits 16    Date for PT Re-Evaluation 10/02/21    Authorization Type BCBS    Progress Note Due on Visit    PT Start Time 1103    PT Stop Time [id number]    PT Time Calculation (min) 42 min    Activity Tolerance Patient tolerated treatment well    Behavior During Therapy WFL for tasks assessed/performed              Past Medical History:  Diagnosis Date   Hyperlipidemia    Hypertension    Past Surgical History:  Procedure Laterality Date   REVISION TOTAL HIP ARTHROPLASTY     Patient Active Problem List   Diagnosis Date Noted   OSA (obstructive sleep apnea) 12/23/2020   History of COVID-19 10/04/2020   Memory loss 10/04/2020   Chronic respiratory failure with hypoxia ([place]) 10/04/2020   Pneumonia due to COVID-19 virus 02/13/2020   Acute respiratory failure with hypoxia ([place]) 02/13/2020   Obesity (BMI 30-39.9) 11/18/2017   Pure hypercholesterolemia 05/19/2017   Essential hypertension 04/21/2017   History of total hip arthroplasty, left 10/20/2013    Osteoarthritis of hip 06/14/2013    REFERRING DIAG: Z96.649 (ICD-10-CM) - S/P R hip replacement  THERAPY DIAG:  Pain in right hip  Stiffness of right hip, not elsewhere classified  Muscle weakness (generalized)  Difficulty in walking, not elsewhere classified  PERTINENT HISTORY: hypertension, obesity class II, dyslipidemia and DJD, long COVID, OSA   PRECAUTIONS: posterior hip precautions  SUBJECTIVE:  Pt reports the R hip still pops a little bit and his L knee now bothers him.    PAIN:  Are you having pain? Yes: NPRS scale: 2/10 Pain location: L knee   OBJECTIVE: (objective measures completed at initial evaluation unless otherwise dated) Focus on Therapeutic Outcomes (FOTO)  hip: 43%, predicted outcome after 14 visits 75%  09/13/21 FOTO 52% (09/10/2021)        ROM / Strength    AROM / PROM / Strength AROM;Strength          AROM    Overall AROM  Deficits;Due to precautions     Overall AROM Comments R hip deficits due to precautions, not formally measured today          Strength    Overall Strength Deficits;Due to precautions;Due to pain     Overall Strength Comments decreased R hip strength, 3/5 grossly.   5/5 bil quad strength and ankle DF strength.  3+/5 R hip flexor weakness.        Transfers  Transfers Sit to Stand  5x sit to stand test : 15 sec, no UE support.    Sit to Stand 6: Modified independent (Device/Increase time)     Comments keeps RLE extended due to precautions, UE assist.          Ambulation/Gait    Ambulation/Gait Yes  Yes   Ambulation/Gait Assistance 6: Modified independent (Device/Increase time)  7: Independent   Ambulation Distance (Feet) 50 Feet  625   Assistive device Straight cane  No device   Gait Pattern Step-through pattern;Decreased stance time - right;Decreased weight shift to right     Gait velocity 0.46 m/s or 1.5 ft/sec  0.84 m/s        Balance    Balance Assessed Yes          Standardized Balance Assessment    Standardized  Balance Assessment Timed Up and Go Test  FGA 24/30        Timed Up and Go Test    TUG Normal TUG     Normal TUG (seconds) 30     TUG Comments with SPC       TODAY'S TREATMENT:  09/20/21 Therapeutic Exercise: Nu Step L6x61min Step up and over 6' step 2 x 10; 1 hand assist R Lateral step down with slight hip abduction on descending leg 2 hand assist 2 x 10; last set no UE assist STS x 10 - cues for ant WS Seated R HS stretch 2x30" R HS curls with blue TB x 20 Thomas stretch R side 2x30" Supine bridges with GTB x 10  09/17/21 Therapeutic Exercise: to improve strength and mobility.  Verbal and tactile cues throughout for technique. Gait x 950' with SPC for warm-up Star excursion pattern -forward, side, back steps 2 x 10 bil  Step ups (1 riser) 2 x 10 bil - UE support Side step ups (1 riser) 2 x 10  bil  Step down (no riser) 2 x 15 bil Seated hip flexion x 10, seated hip abduction x10   09/12/21 Therapeutic Exercise: to improve strength and mobility.  Verbal and tactile cues throughout for technique. Nustep level 6 x 6 min FGA; 5x STS, seated marches x 10 bil, seated R side steps x 10 (tolerated better than flexion), standing hip flexion x 10 R, standing hip marches x 10 R, seated hip flexion x 20. Gait x 6 min (625' total without device, carrying SPC, DOE)    Trigger Point Dry-Needling  Treatment instructions: Expect mild to moderate muscle soreness. S/S of pneumothorax if dry needled over a lung field, and to seek immediate medical attention should they occur. Patient verbalized understanding of these instructions and education.  Patient Consent Given: Yes Education handout provided: Yes Muscles treated: R glut med, piriformis, TFL Electrical stimulation performed: No Parameters: N/A Treatment response/outcome: Twitch Response Elicited and Palpable Increase in Muscle Length  PATIENT EDUCATION: Education details:  HEP update Person educated: Patient Education method:  Explanation, Demonstration, Verbal cues, and Handouts Education comprehension: verbalized understanding and returned demonstration   HOME EXERCISE PROGRAM: [id number]   ASSESSMENT: Clinical Impression: Darren Wood showed a good response to the progression of TE today. He still noted popping in the R hip with steps but no pain. He showed trouble with R eccentric control descending 6' step. He still does STS tranfers with more L LE use so we worked on symmetrical LE placement to strength to R LE. He is continuing to progress well.  PT SHORT TERM GOAL #1   Title Ind with initial HEP    Time 1    Period Weeks    Status Achieved    Target Date 08/14/21      PT SHORT TERM GOAL #2   Title Demonstrate improved balance and decreased risk of falls by completing TUG <25 seconds    Baseline 30 sec with SPC    Time 4    Period Weeks    Status Achieved              PT Long Term Goals - 09/13/21 1108       PT LONG TERM GOAL #1   Title Ind with progressed HEP to improve functional outcomes.    Time 8    Period Weeks    Status On-going   09/13/21- good compliance   Target Date 10/02/21      PT LONG TERM GOAL #2   Title Demonstrate improved functional ability as demonstrated by FOTO score of 75%.    Baseline 43%    Time 8    Period Weeks    Status On-going   09/10/21- 52%   Target Date 10/02/21      PT LONG TERM GOAL #3   Title Be able to ascend/descend 14 stairs with HR, reciprocal step and no increase in R hip pain safely to access home.    Baseline increased pain 5-6/10 with stairs at home.    Time 8    Period Weeks    Status On-going   09/13/21- step to gait still due to increased R hip pain with reciprocal step.   Target Date 10/02/21      PT LONG TERM GOAL #4   Title Able to ambulate 900 ft safely without AD in 6 minutes to improve community access.    Baseline slow gait speed with SPC, limited to household distances    Time 8    Period Weeks    Status On-going    09/10/21- 600' with SPC, DOE   Target Date 10/02/21      PT LONG TERM GOAL #5   Title Pt. will demonstrate decreased risk of falls as demonstrated by FGA >22/30    Baseline deferred today    Time 8    Period Weeks    Status Achieved   09/13/21- 24/30   Target Date 10/02/21            PLAN: PT Frequency 2x / week   PT Duration 8 weeks   PT Treatment/Interventions ADLs/Self Care Home Management;Cryotherapy;Electrical Stimulation;Moist Heat;Stair training;Functional mobility training;Therapeutic activities;Gait training;Therapeutic exercise;Balance training;Neuromuscular re-education;Patient/family education;Manual techniques;Dry needling;Taping;Passive range of motion;Joint Manipulations   PT Next Visit Plan Continue R hip flexor strengthening, gait training.                   Patient will benefit from skilled therapeutic intervention in order to improve the following deficits and impairments:  Abnormal gait, Decreased activity tolerance, Decreased endurance, Decreased range of motion, Decreased skin integrity, Decreased strength, Increased fascial restricitons, Pain, Decreased balance, Decreased mobility, Decreased safety awareness, Difficulty walking, Increased edema, Increased muscle spasms, Impaired flexibility     Artist Pais, PTA 09/20/2021, 11:55 AM

## 2021-09-24 ENCOUNTER — Ambulatory Visit: Payer: BC Managed Care – PPO

## 2021-10-02 ENCOUNTER — Ambulatory Visit: Payer: BC Managed Care – PPO | Admitting: Physical Therapy

## 2021-11-20 ENCOUNTER — Encounter: Payer: Self-pay | Admitting: Internal Medicine

## 2021-11-24 NOTE — Progress Notes (Signed)
HPI 19 yoM never smoker followed for OSA, complicated by Bronchiectasis(Dr Ramaswamy), HTN, Covid pneumonia, June 2022, Chronic Respiratory Failure with Hypoxia, Elevated Hemidiaphragm, Hypercholesterolemia, Obesity, O2 2L used only for exertion HST 02/21/21- AHI 44.3/ hr, desaturation to 70%, Body weight 262 lbs  =================================================================================  05/27/21- 60 yoM never smoker followed for OSA, complicated by Bronchiectasis(Dr Ramaswamy), HTN, Covid pneumonia, June 2022, Chronic Respiratory Failure with Hypoxia, Elevated Hemidiaphragm, Hypercholesterolemia, Obesity, O2 2L used only for exertion HST 02/21/21- AHI 44.3/ hr, desaturation to 70%, Body weight 262 lbs CPAP auto 5-20/ [employer]  ordered 03/13/21 Download- compliance 40%, AHI 0.5/ hr Body weight today-260 lbs Covid vax-none Flu vax-had -----Patient states that the mask for CPAP gets irritating and has to take it off. States that sometimes when he wakes up he has some chest discomfort and feels like he cant get a good breath. Download reviewed and comfort issues discussed. Will refer for mask fitting. We are going to explore what impact sample Trelegy may have on dyspnea he notes on morning waking.  Hip pain requires rolling walker.   11/25/21- 61 yoM never smoker followed for OSA, complicated by Bronchiectasis(Dr Ramaswamy), HTN, Covid pneumonia, June 2022, Chronic Respiratory Failure with Hypoxia, Elevated Hemidiaphragm, Hypercholesterolemia, Obesity, -Trelegy 100,  O2 2L used only for exertion CPAP auto 5-20/ [employer]  ordered 03/13/21      AirSense 11 AutoSet Download- compliance 20%, AHI 0.8/ hr Body weight today-262 lbs Not using Trelegy routinely Download reviewed. Admits he hasn't been bothering to use CPAP regularly. Sleeps better if he puts it on.  Very casual about use of O2 and inhalers. Education effort made today.  ROS-see HPI   + = positive Constitutional:    weight loss,  night sweats, fevers, chills, fatigue, lassitude. HEENT:    headaches, difficulty swallowing, +tooth/dental problems, sore throat,       +sneezing, itching, ear ache, nasal congestion, post nasal drip, snoring CV:    chest pain, orthopnea, PND, swelling in lower extremities, anasarca,                                   dizziness, palpitations Resp:   +shortness of breath with exertion or at rest.                productive cough,   non-productive cough, coughing up of blood.              change in color of mucus.  wheezing.   Skin:    rash or lesions. GI:  No-   heartburn, +indigestion, abdominal pain, nausea, vomiting, diarrhea,                 change in bowel habits, loss of appetite GU: dysuria, change in color of urine, no urgency or frequency.   flank pain. MS:   +joint pain, +stiffness, decreased range of motion, back pain. Neuro-     nothing unusual Psych:  change in mood or affect.  depression or anxiety.   memory loss.   OBJ- Physical Exam General- Alert, Oriented, Affect-appropriate, Distress- none acute, + obese  Skin- rash-none, lesions- none, excoriation- none Lymphadenopathy- none Head- atraumatic            Eyes- Gross vision intact, PERRLA, conjunctivae and secretions clear            Ears- Hearing, canals-normal            Nose- Clear, no-Septal dev,  mucus, polyps, erosion, perforation             Throat- Mallampati IV , mucosa clear , drainage- none, tonsils- atrophic, + teeth Neck- flexible , trachea midline, no stridor , thyroid nl, carotid no bruit Chest - symmetrical excursion , unlabored           Heart/CV- RRR , no murmur , no gallop  , no rub, nl s1 s2                           - JVD- none , edema- none, stasis changes- none, varices- none           Lung- clear to P&A, wheeze- none, cough- none , dullness-none, rub- none           Chest wall-  Abd-  Br/ Gen/ Rectal- Not done, not indicated Extrem- L knee elastic sleeve Neuro- grossly intact to  observation

## 2021-11-25 ENCOUNTER — Ambulatory Visit (INDEPENDENT_AMBULATORY_CARE_PROVIDER_SITE_OTHER): Payer: BC Managed Care – PPO | Admitting: Internal Medicine

## 2021-11-25 ENCOUNTER — Encounter: Payer: Self-pay | Admitting: Internal Medicine

## 2021-11-25 DIAGNOSIS — G4733 Obstructive sleep apnea (adult) (pediatric): Secondary | ICD-10-CM | POA: Diagnosis not present

## 2021-11-25 DIAGNOSIS — E669 Obesity, unspecified: Secondary | ICD-10-CM

## 2021-11-25 DIAGNOSIS — J9611 Chronic respiratory failure with hypoxia: Secondary | ICD-10-CM

## 2021-11-25 MED ORDER — ALBUTEROL SULFATE HFA 108 (90 BASE) MCG/ACT IN AERS: 2.0000 | INHALATION_SPRAY | Freq: Four times a day (QID) | RESPIRATORY_TRACT | 12 refills | Status: AC | PRN

## 2021-11-25 NOTE — Patient Instructions (Signed)
Our goal with cpap is to work towards using it whenever you sleep, to protect your from blocking your airway over and over during the night.  Script sent for an albuterol "rescue" inhaler--   inhale 2 puffs every 6 hours, only if needed for wheeze, cough, chest tightness.  Please call if we can help.

## 2022-01-03 ENCOUNTER — Encounter: Payer: Self-pay | Admitting: Internal Medicine

## 2022-01-03 NOTE — Assessment & Plan Note (Signed)
Emphasized medical basis and discussed alternatives and comfort issues again.

## 2022-01-03 NOTE — Assessment & Plan Note (Signed)
Advised him to use O2 during sleep, through CPAP.  Otherwise as needed with exertion.

## 2022-01-03 NOTE — Assessment & Plan Note (Signed)
Encourage effort to lose some weight

## 2022-02-20 ENCOUNTER — Ambulatory Visit (INDEPENDENT_AMBULATORY_CARE_PROVIDER_SITE_OTHER): Payer: BC Managed Care – PPO | Admitting: Internal Medicine

## 2022-02-20 ENCOUNTER — Encounter: Payer: Self-pay | Admitting: Internal Medicine

## 2022-02-20 VITALS — BP 118/72 | HR 84 | Temp 98.4°F | Ht 71.0 in | Wt 257.8 lb

## 2022-02-20 DIAGNOSIS — U099 Post covid-19 condition, unspecified: Secondary | ICD-10-CM

## 2022-02-20 DIAGNOSIS — Z8616 Personal history of COVID-19: Secondary | ICD-10-CM | POA: Diagnosis not present

## 2022-02-20 DIAGNOSIS — R0609 Other forms of dyspnea: Secondary | ICD-10-CM

## 2022-02-20 DIAGNOSIS — E669 Obesity, unspecified: Secondary | ICD-10-CM | POA: Diagnosis not present

## 2022-02-20 DIAGNOSIS — R972 Elevated prostate specific antigen [PSA]: Secondary | ICD-10-CM

## 2022-02-20 DIAGNOSIS — I5189 Other ill-defined heart diseases: Secondary | ICD-10-CM

## 2022-02-20 NOTE — Progress Notes (Signed)
OV 11/02/2020  Subjective:  Patient ID: Darren Wood, male , DOB: 1960/07/25 , age 61 y.o. , MRN: [id number] , ADDRESS: [address] [place] [address] PCP Jefm Petty, MD Patient Care Team: Jefm Petty, MD as PCP - General (Family Medicine)  This Provider for this visit: Treatment Team:  Attending Provider: Brand Males, MD    11/02/2020 -   Chief Complaint  Patient presents with   Consult    Pt is being referred by Lazaro Arms, NP due to SOB from covid.  Pt states he had Covid Oct 2021 and states he is still having problems with SOB that happens with activities.     HPI Darren Wood 61 y.o. -history is gained from talking to the patient and also review of the records.  He has a history of hypertension, obesity class II, dyslipidemia and DJD.  He tested positive for COVID on 02/07/2020.  He was admitted between 02/13/2020 through 02/26/2020 with COVID-19 at the [facility] health [facility].  He had an abnormal chest x-ray that is documented below.  He was treated with oxygen, remdesivir, steroids and tocilizumab.  He also got a short course of antibiotics for concern for aspiration pneumonia.  He also got nystatin for thrush.  He was discharged on oxygen.   In January 2022 he had CT scan of the chest at [facility] that showed some bronchiectasis.  Elevation of right diaphragm also noted.  His ANA was negative./Borderline positive.  It is unclear to me if a sniff test was done.  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  He is also complaining of brain fog.  Review of the outside records indicat that he saw Dr. Esaw Dace [facility] pulmonary fellow as recently as May 2022.  His assessment and plan as listed below.  1. Post-COVID syndrome  2. Restrictive lung disease  3. Elevated hemidiaphragm  4. Chronic respiratory failure with hypoxia (HCC)   #1 post COVID  syndrome Resume use of Flovent 110 inhaler twice daily as directed; I reminded him that he has refills available to him through 04/24/2021 Continue use of albuterol inhaler as needed for chest congestion, chest tightness or shortness of breath Referral to long COVID program at [place] with Dr. Ned Clines [phone]; [facility] [address], [place] [address]  2. Restrictive lung disease 3. Elevated hemidiaphragm PFT 04/30/2020 ratio 91% FEV1 59% FVC 50% DLCO 51% Right hemidiaphragm noted and is contributing to overall dyspnea on exertion  4. Chronic respiratory failure 6-minute walk test conducted today again shows oxygen desaturation on exertion with need for 2 L/min supplemental oxygen Continue use of supplemental oxygen to maintain pulse ox readings between 88% and 93% The referral for pulmonary rehab was placed 6 to 8 weeks ago. He has not yet gotten a place in the pulmonary rehab program. He will start the pulmonary rehab as soon as he is notified.  Short-term disability form completed to have him out of work 09/21/2020 - 01/03/2021  On 10/04/2020 he visited with Lazaro Arms, [place] nurse practitioner.  At this time he expressed symptoms of dyspnea with exertion, myalgias, arthralgias, memory loss.  This is despite Flovent and doing pulm rehabilitation.  It was noticed that he was still requiring 2 L with exertion.  He denied any fever chills.  Denied any nausea vomiting  diarrhea.  Denied any hemoptysis.  Denied chest pain or edema.  Therefore pulmonary referral was made.  He then started pulmonary rehabilitation on 10/09/2020, he presents to the pulmonary clinic on 11/02/2020 for new consult evaluation.  He lives in [place] he and his wife state that [place] is closer.  He is happy to continue and establish with the follow-up here.  At some point he will need his disability refill.  At this point in time he is complaining of significant dyspnea on exertion along with  cognitive processing issues post-COVID.  In talking to him his Epworth sleepiness score is 10.  His wife says he snores a lot and does have apneic spells at night.  Never been tested or diagnosed with sleep apnea.  No known heart issues.  Overall course is 1 of improvement but he still feels he is 20-30% you need to make in order to feel fully optimal.  At home he uses oxygen with exertion but not at rest or walking room to room.  He was surprised that in a walking desaturation test today he did not desaturate to the point he needs oxygen.  At rehab he is using 2 L.     CXR Oct 2021  Narrative & Impression  CLINICAL DATA:  Hypoxia.  COVID positive   EXAM: PORTABLE CHEST 1 VIEW   COMPARISON:  02/13/2020   FINDINGS: Stable mild bilateral infiltrate and low lung volumes. No visible effusion or pneumothorax. Stable heart size.   IMPRESSION: Stable low volume chest with mild infiltrates.     Electronically Signed   By: Monte Fantasia M.D.   On: 02/20/2020 07:57     Jan 2022 CT at [place]:  There is tubular bronchiectasis throughout, worst in the bilateral  lower lobes, with peripheral irregular and ground-glass airspace  opacity, including bandlike elements with subpleural sparing. This  appearance is consistent with chronic sequelae of organizing  pneumonia and particularly is in keeping with chronic post  infectious scarring related to prior COVID 19 airspace disease.    Electronically Signed    By: Eddie Candle M.D.    On: 05/16/2020 13:05   has a past medical history of Hypertension.   reports that he has never smoked. He has never used smokeless tobacco.   12/19/20- 60 yoM never smoker for sleep evaluation courtesy of Dr Renette Butters 11/02/20) - Dr Annamaria Boots Sleep Medical problem list includes Bronchiectasis, HTN, Covid pneumonia, June 2022, Chronic Respiratory Failure with Hypoxia, Elevated Hemidiaphragm, Hypercholesterolemia, Obesity, O2 2L used only for  exertion Epworth score-4 Body weight today-262 lbs Covid vax- Complains of snoring, tiredd, waking often, tossing and turning. No ENT surgery. No lung problems prior to Covid infection. No heart probs. No parasomnias. Says lost 40 lbs related to Covid infection.  CT chest 11/29/20- IMPRESSION: 1. Pulmonary parenchymal pattern of peripheral and basilar predominant coarsened ground-glass and bronchiectasis/bronchiolectasis, minimally improved from 05/16/2020 and in keeping with sequelae of COVID-19 pneumonia. 2. Hepatic steatosis. 3. Enlarged pulmonary arteries, indicative of pulmonary arterial hypertension.    OV 01/16/2021  Subjective:  Patient ID: Darren Wood, male , DOB: December 08, 1960 , age 39 y.o. , MRN: [id number] , ADDRESS: [address] [place] [address] PCP Jefm Petty, MD Patient Care Team: Jefm Petty, MD as PCP - General (Family Medicine)  This Provider for this visit: Treatment Team:  Attending Provider: Brand Males, MD    01/16/2021 -   Chief Complaint  Patient presents with   Follow-up  Pt states he has been doing okay since last visit. States he still becomes winded when he exerts himself.   COVID long-haul follow-up  HPI Constantine Aulet 61 y.o. -presents for follow-up.  He presents with his wife.  Since last seeing me he has had his echocardiogram that probably shows diastolic dysfunction.  He had a high-resolution CT scan that shows minimal ILD changes.  He has attended pulmonary rehabilitation.  He feels this improved him.  He has seen Dr. Annamaria Boots and outpatient home sleep study is pending.  He says that since his COVID last year he continues to improve.  Every few month interval he is continue to improve.  In fact his ILD symptom score show improvement.  However he feels he is no longer able to do physical work.  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  In the past he did physical work.  Before he got COVID he was completely fine.  He was unvaccinated and had to  be hospitalized.  He feels his brain fog is also improved with a still brain fog.  He is awaiting Social Security disability and long-term disability through his employer.  He wants to be written out till end of the year which apparently already have.  He wants to come back in December 2022 to see if he is better off his disability needs to be extended.  I have emphasized to him that much is unknown about COVID long-haul but he does have to motivate himself to lose weight and get physically fit.  He is agreed to be referred for a weight loss program.       PFT   1. Left ventricular ejection fraction, by estimation, is 60 to 65%. The  left ventricle has normal function. The left ventricle has no regional  wall motion abnormalities- though technically difficult study. There is  moderate concentric left ventricular  hypertrophy. Left ventricular diastolic parameters are consistent with  Grade I diastolic dysfunction (impaired relaxation).   2. Right ventricular systolic function is normal. The right ventricular  size is normal. Tricuspid regurgitation signal is inadequate for assessing  PA pressure.   3. Left atrial size was mildly dilated.   4. The mitral valve is normal in structure. No evidence of mitral valve  regurgitation. No evidence of mitral stenosis.   5. The aortic valve is tricuspid. Aortic valve regurgitation is not  visualized. No aortic stenosis is present.   6. Aortic dilatation noted. There is mild dilatation of the ascending  aorta, measuring 40 mm.   Comparison(s): No prior Echocardiogram. Consider use of echo-contrast in  future studies.    OV 04/11/2021  Subjective:  Patient ID: Darren Wood, male , DOB: Oct 08, 1960 , age 46 y.o. , MRN: [id number] , AD[address]ESS: [address] [place] [address] PCP Jefm Petty, MD Patient Care Team: Jefm Petty, MD as PCP - General (Family Medicine)  This Provider for this visit: Treatment Team:  Attending Provider:  Brand Males, MD    04/11/2021 -   Chief Complaint  Patient presents with   Follow-up    Surgery clearance   COVID long-haul follow-up  HPI Draeden Viele 61 y.o. -returns for follow-up.  In the interim is been approved for Social Security disability.  His employer has approved him for long-term disability.  Today I signed the form for [facility] claims.  His long-haul physical rehab exercises have been limited now because of development of hip arthritis.  He wants a hip surgery.  He is here  for preoperative clearance.  Wife also wants to know if he can take the COVID by Valent mRNA vaccine.  His COVID was over a year ago.  Was probably delta variant.  That was the dominant strain at that time.  He is also had a left-sided sciatic nerve pain flare.  In the interim his been diagnosed with sleep apnea.  He is going to start CPAP tonight.  He uses oxygen at niCT Chest data  No results found.  OV 02/20/2022  Subjective:  Patient ID: Aram CandelaMaurice Marshburn, male , DOB: 07/23/1960 , age 61 y.o. , MRN: [id number] , AD[address]ESS: [address] [place] [id number] PCP Loyal JacobsonKalish, Michael, MD Patient Care Team: Loyal JacobsonKalish, Michael, MD as PCP - General (Family Medicine)  This Provider for this visit: Treatment Team:  Attending Provider: Kalman Shanamaswamy, Lathan Gieselman, MD    02/20/2022 -   Chief Complaint  Patient presents with   Follow-up    Breathing good unless increased exertion.  Would like a handicap car app.  Oxygen 2L prn during day.  CPAP at bedtime.   Obesity OSA on CPAP since December 2022 follows with Dr. Jetty Duhamellinton Young Physical deconditioning Grade 1 diastolic dysfunction History of COVID-21 February 2020 known with hospitalization subsequent COVID long-haul dyspnea   HPI Aram CandelaMaurice Margraf 61 y.o. -returns for follow-up.  Not seen since December 202.  He states he is physically stronger he is driving right now.  He gets around with a cane.  He is doing ADLs.  Nevertheless he still has dyspnea on exertion.   Last pulmonary function test, CT scan and echocardiogram all greater than 1 year ago.  His obesity continues.  Otherwise no big issues.  He has had his flu shot.  He plans to have the RSV shot but he does not want to have the COVID mRNA vaccine.   ght.     SYMPTOM SCALE - ILD 11/02/2020  01/16/2021   O2 use ra ra  Shortness of Breath 0 -> 5 scale with 5 being worst (score 6 If unable to do)   At rest 0 0  Simple tasks - showers, clothes change, eating, shaving 2 1  Household (dishes, doing bed, laundry) 2 1  Shopping 4 2  Walking level at own pace 3 2  Walking up Stairs 3 2  Total (30-36) Dyspnea Score 14 8  How bad is your cough? 0 0  How bad is your fatigue 3 3  How bad is nausea 0 2  How bad is vomiting?  0 1  How bad is diarrhea? 2 0-  How bad is anxiety? 1 2  How bad is depression 1 0  Brain fog 3` 1        Simple office walk 185 feet x  3 laps goal with forehead probe 11/02/2020 - slow pace with cane  01/16/2021   O2 used Ra (has o2 at  home since covid oct 2021 but this walk is RA)   Number laps completed 3  3  Comments about pace Slow with cane Slow pace with cane  Resting Pulse Ox/HR 100% and 94/min 100% and 90  Final Pulse Ox/HR 94% and 132/min 96% and 126/,on  Desaturated </= 88% no no  Desaturated <= 3% points Yes, 6 potnt Ues, 4 ppt  Got Tachycardic >/= 90/min yes yes  Symptoms at end of test Moderate dyspnea Moderate dyspnea  Miscellaneous comments x     PFT     Latest Ref Rng & Units 12/05/2020  2:45 PM  PFT Results  FVC-Pre L 3.01   FVC-Predicted Pre % 72   FVC-Post L 2.99   FVC-Predicted Post % 72   Pre FEV1/FVC % % 90   Post FEV1/FCV % % 90   FEV1-Pre L 2.71   FEV1-Predicted Pre % 84   FEV1-Post L 2.69   DLCO uncorrected ml/min/mmHg 15.70   DLCO UNC% % 55   DLCO corrected ml/min/mmHg 16.02   DLCO COR %Predicted % 56   DLVA Predicted % 93        has a past medical history of Hyperlipidemia and Hypertension.   reports that he has  never smoked. He has never used smokeless tobacco.  Past Surgical History:  Procedure Laterality Date   REVISION TOTAL HIP ARTHROPLASTY      No Known Allergies  Immunization History  Administered Date(s) Administered   Influenza,inj,Quad PF,6+ Mos 01/16/2021   Influenza,inj,Quad PF,6-35 Mos 04/21/2017   Pneumococcal Polysaccharide-23 11/22/2020   Tdap 06/05/2014    No family history on file.   Current Outpatient Medications:    acetaminophen (TYLENOL) 500 MG tablet, Take 500 mg by mouth every 6 (six) hours as needed for fever., Disp: , Rfl:    albuterol (VENTOLIN HFA) 108 (90 Base) MCG/ACT inhaler, Inhale 2 puffs into the lungs every 6 (six) hours as needed for wheezing or shortness of breath., Disp: 8 g, Rfl: 12   atorvastatin (LIPITOR) 10 MG tablet, Take 10 mg by mouth daily., Disp: , Rfl:    losartan-hydrochlorothiazide (HYZAAR) 100-12.5 MG tablet, Take 1 tablet by mouth daily., Disp: , Rfl:    diclofenac Sodium (VOLTAREN) 1 % GEL, Apply 2 g topically 4 (four) times daily. (Patient not taking: Reported on 02/20/2022), Disp: 100 g, Rfl: 0   lidocaine (LIDODERM) 5 %, Place 1 patch onto the skin daily. Remove & Discard patch within 12 hours or as directed by MD (Patient not taking: Reported on 02/20/2022), Disp: 30 patch, Rfl: 0   methocarbamol (ROBAXIN) 500 MG tablet, Take 1 tablet (500 mg total) by mouth 2 (two) times daily. (Patient not taking: Reported on 02/20/2022), Disp: 20 tablet, Rfl: 0      Objective:   Vitals:   02/20/22 1523  BP: 118/72  Pulse: 84  Temp: 98.4 F (36.9 C)  TempSrc: Oral  SpO2: 95%  Weight: 257 lb 12.8 oz (116.9 kg)  Height: 5\' 11"  (1.803 m)    Estimated body mass index is 35.96 kg/m as calculated from the following:   Height as of this encounter: 5\' 11"  (1.803 m).   Weight as of this encounter: 257 lb 12.8 oz (116.9 kg).  @WEIGHTCHANGE @  [employer]   02/20/22 1523  Weight: 257 lb 12.8 oz (116.9 kg)     Physical  Exam    General: No distress. obese Neuro: Alert and Oriented x 3. GCS 15. Speech normal Psych: Pleasant Resp:  Barrel Chest - no.  Wheeze - no, Crackles - mild crackles at base, No overt respiratory distress CVS: Normal heart sounds. Murmurs - n Ext: Stigmata of Connective Tissue Disease - no HEENT: Normal upper airway. PEERL +. No post nasal drip        Assessment:       ICD-10-CM   1. DOE (dyspnea on exertion)  R06.09     2. Obesity (BMI 30-39.9)  E66.9     3. History of 2019 novel coronavirus disease (COVID-19)  Z86.16     4. COVID-19 long hauler manifesting chronic dyspnea  R06.09  U09.9     5. Elevated PSA  R97.20     6. Grade I diastolic dysfunction  [id number]          Plan:     Patient Instructions  History of 2019 novel coronavirus disease (COVID-19)- oct 2021 Post-COVID-19 syndrome manifesting as chronic dyspnea Abnormal CT of the chest Physical deconditioning Obesityy   - covid long haul continues to improve slowly but still prsent with shortness of breath with exertrion which Is likely du eto weight and heart muscle stiffness  -  last echo, PFT and CT all > 1 year ago  Plan  - do HRCT - do full PFT  -do echo    Sleep apnea - Diagnosis been made by Dr. Baird Lyons   Glad you are on CPAP at night with  oxygen starting 04/11/2021   Plan - According to sleep specialist.  Elevated PSA  Sept 2023  [facility] and concerning for [facility] cancer versus false positive  Plan - talk to PCP Jefm Petty, MD   Followup  - with aPP To discuss test results and next steps    SIGNATURE    Dr. Brand Males, M.D., F.C.C.P,  Pulmonary and Critical Care Medicine Staff Physician, [place] Director - Interstitial Lung Disease  Program  Pulmonary [place] [facility] [place], [place], [address]  Pager: [phone], If no answer or between  15:00h - 7:00h: call [phone] Telephone: [phone]  4:03 PM 02/20/2022

## 2022-02-20 NOTE — Patient Instructions (Addendum)
History of 2019 novel coronavirus disease (COVID-19)- oct 2021 Post-COVID-19 syndrome manifesting as chronic dyspnea Abnormal CT of the chest Physical deconditioning Obesityy   - covid long haul continues to improve slowly but still prsent with shortness of breath with exertrion which Is likely du eto weight and heart muscle stiffness  -  last echo, PFT and CT all > 1 year ago  Plan  - do HRCT - do full PFT  -do echo    Sleep apnea - Diagnosis been made by Dr. Baird Lyons   Glad you are on CPAP at night with  oxygen starting 04/11/2021   Plan - According to sleep specialist.  Elevated PSA  Sept 2023  -[facility] and concerning for prostate cancer versus false positive  Plan - talk to PCP Jefm Petty, MD   Followup  - with aPP To discuss test results and next steps

## 2022-02-20 NOTE — Addendum Note (Signed)
Addended byOralia Rud M on: 02/20/2022 04:28 PM   Modules accepted: Orders

## 2022-03-03 ENCOUNTER — Other Ambulatory Visit: Payer: BC Managed Care – PPO

## 2022-03-03 ENCOUNTER — Ambulatory Visit
Admission: RE | Admit: 2022-03-03 | Discharge: 2022-03-03 | Disposition: A | Discharge status: Home / Self Care | Payer: BC Managed Care – PPO | Source: Ambulatory Visit | Attending: Internal Medicine | Admitting: Internal Medicine

## 2022-03-03 DIAGNOSIS — R0609 Other forms of dyspnea: Secondary | ICD-10-CM

## 2022-03-07 ENCOUNTER — Ambulatory Visit (HOSPITAL_BASED_OUTPATIENT_CLINIC_OR_DEPARTMENT_OTHER)
Admission: RE | Admit: 2022-03-07 | Discharge: 2022-03-07 | Disposition: A | Discharge status: Home / Self Care | Payer: BC Managed Care – PPO | Source: Ambulatory Visit | Attending: Internal Medicine | Admitting: Internal Medicine

## 2022-03-07 DIAGNOSIS — R0609 Other forms of dyspnea: Secondary | ICD-10-CM | POA: Diagnosis present

## 2022-03-07 DIAGNOSIS — U099 Post covid-19 condition, unspecified: Secondary | ICD-10-CM | POA: Diagnosis present

## 2022-03-07 LAB — ECHOCARDIOGRAM COMPLETE
AR max vel: 2.73 cm2
AV Area VTI: 3.21 cm2
AV Area mean vel: 3.16 cm2
AV Mean grad: 3 mmHg
AV Peak grad: 6.5 mmHg
Ao pk vel: 1.27 m/s
Area-P 1/2: 2.92 cm2
S' Lateral: 2.7 cm

## 2022-03-26 ENCOUNTER — Telehealth: Payer: Self-pay | Admitting: Primary Care

## 2022-03-26 NOTE — Telephone Encounter (Signed)
Entered in error

## 2022-03-26 NOTE — Telephone Encounter (Signed)
Received a faxed Physician's Statement from [facility] to renew patient's long term disability that expires on 05/04/2022.  Patient has a PFT and appointment with Buelah Manis, NP on 12/13 and she will review the form and discuss pt's ability to return to work at that time.

## 2022-04-09 ENCOUNTER — Telehealth: Payer: Self-pay | Admitting: Internal Medicine

## 2022-04-09 NOTE — Telephone Encounter (Signed)
PT presented to the front lobby w/ppwk for disability. Dr. Marchelle Gearing completes for him yearly. I put the envelope with docs in Dr. Charlean Sanfilippo mail slot here where the schedulers take calls. Adv PT I would be sure he got them. TY.

## 2022-04-10 NOTE — Telephone Encounter (Signed)
Paperwork has been placed up front in Darren Wood's box. Routing to Darren Wood for her to follow up on.

## 2022-04-16 ENCOUNTER — Ambulatory Visit (INDEPENDENT_AMBULATORY_CARE_PROVIDER_SITE_OTHER): Payer: BC Managed Care – PPO | Admitting: Internal Medicine

## 2022-04-16 ENCOUNTER — Encounter: Payer: Self-pay | Admitting: Primary Care

## 2022-04-16 ENCOUNTER — Ambulatory Visit (INDEPENDENT_AMBULATORY_CARE_PROVIDER_SITE_OTHER): Payer: BC Managed Care – PPO | Admitting: Primary Care

## 2022-04-16 VITALS — BP 122/76 | HR 79 | Temp 97.9°F | Ht 71.0 in | Wt 259.0 lb

## 2022-04-16 DIAGNOSIS — J9611 Chronic respiratory failure with hypoxia: Secondary | ICD-10-CM | POA: Diagnosis not present

## 2022-04-16 DIAGNOSIS — G4733 Obstructive sleep apnea (adult) (pediatric): Secondary | ICD-10-CM

## 2022-04-16 DIAGNOSIS — U099 Post covid-19 condition, unspecified: Secondary | ICD-10-CM

## 2022-04-16 DIAGNOSIS — R0609 Other forms of dyspnea: Secondary | ICD-10-CM

## 2022-04-16 LAB — PULMONARY FUNCTION TEST
DL/VA % pred: 94 %
DL/VA: 3.92 ml/min/mmHg/L
DLCO cor % pred: 61 %
DLCO cor: 17.08 ml/min/mmHg
DLCO unc % pred: 61 %
DLCO unc: 17.08 ml/min/mmHg
FEF 25-75 Post: 5.8 L/sec
FEF 25-75 Pre: 5.04 L/sec
FEF2575-%Change-Post: 15 %
FEF2575-%Pred-Post: 198 %
FEF2575-%Pred-Pre: 172 %
FEV1-%Change-Post: 5 %
FEV1-%Pred-Post: 78 %
FEV1-%Pred-Pre: 74 %
FEV1-Post: 2.86 L
FEV1-Pre: 2.72 L
FEV1FVC-%Change-Post: 3 %
FEV1FVC-%Pred-Pre: 118 %
FEV6-%Change-Post: 2 %
FEV6-%Pred-Post: 67 %
FEV6-%Pred-Pre: 66 %
FEV6-Post: 3.12 L
FEV6-Pre: 3.05 L
FEV6FVC-%Pred-Post: 105 %
FEV6FVC-%Pred-Pre: 105 %
FVC-%Change-Post: 1 %
FVC-%Pred-Post: 64 %
FVC-%Pred-Pre: 63 %
FVC-Post: 3.12 L
FVC-Pre: 3.06 L
Post FEV1/FVC ratio: 92 %
Post FEV6/FVC ratio: 100 %
Pre FEV1/FVC ratio: 89 %
Pre FEV6/FVC Ratio: 100 %

## 2022-04-16 NOTE — Assessment & Plan Note (Signed)
-   Chronic dyspnea is baseline. He gets winded with moderate-heavy exertion. He is fine a rest. No significant cough.  - CT chest imaging, PFTs and echocardiogram remain stable compared to previous  - Continue to encourage weight loss and exercise  - Long term disability paperwork being completed by Dr. Marchelle Gearing

## 2022-04-16 NOTE — Progress Notes (Signed)
PFT done today. 

## 2022-04-16 NOTE — Patient Instructions (Addendum)
CT chest showed no progression of scarring  Pulmonary function testing showed stable lung function   Echocardiogram also remains stable compared to previous   Recommendations: Stay as active as possible Work on weight loss as able  Continue to wear CPAP and oxygen at night  Follow-up: 3 months with Dr. Marchelle Gearing

## 2022-04-16 NOTE — Progress Notes (Signed)
@Patient  ID: , male    DOB: January 19, 1961, 61 y.o.   MRN: [id number]  Chief Complaint  Patient presents with   Follow-up    PFT 04/16/2022 Breathing is good until exertion. OSA [employer]      Referring provider: 04/18/2022, MD  HPI: 61 -year-old male, never smoked.  Past medical history significant for hypertension, OSA, chronic respiratory failure with hypoxia, pneumonia due to COVID-19, obesity.   Previous [facility] pulmonary encounter 02/20/2022 -   Chief Complaint  Patient presents with   Follow-up    Breathing good unless increased exertion.  Would like a handicap car app.  Oxygen 2L prn during day.  CPAP at bedtime.   Obesity OSA on CPAP since December 2022 follows with Dr. January 2023 Physical deconditioning Grade 1 diastolic dysfunction History of COVID-21 February 2020 known with hospitalization subsequent COVID long-haul dyspnea  Darren Wood 61 y.o. -returns for follow-up.  Not seen since December 202.  He states he is physically stronger he is driving right now.  He gets around with a cane.  He is doing ADLs.  Nevertheless he still has dyspnea on exertion.  Last pulmonary function test, CT scan and echocardiogram all greater than 1 year ago.  His obesity continues.  Otherwise no big issues.  He has had his flu shot.  He plans to have the RSV shot but he does not want to have the COVID mRNA vaccine.  04/16/2022- Interim hx  Patient presents today for for 2 month follow-up with PFTs. He is doing well, overall his breathing is stable. No changes since last visit. He experiences dyspnea with exertion. He joined a gym a couple of months ago. He works out 3 days a week. He needs to sit after walking a while. He is moderately compliant (60%) with CPAP use for the last 30 days, average usage 4 hours 10 minutes. He is waiting for some parts to come in for his CPAP machine. No issues with mask fit or pressure settings. He reports benefit from wearing.   Pulmonary function  testing 12/06/19 PFTs>> FVC 2.99 (72%), FEV1 2.69 (84%), ratio 90, DLCOcor 16.02 (56%)  04/16/2022 PFTs >> FVC 3.12 (64%), FEV1 2.86 (78%), ratio 92, DLCOunc 17.08 (61%)  Imaging: 03/05/22 HRCT>> Unchanged appearance of the lungs with bibasilar predominant ground-glass and irregular interstitial opacity, as well as tubular bronchiectasis of the bilateral lung bases, with evidence of subpleural sparing  Airview download 03/15/22-04/13/22 Usage 18/30 days; 53% > 4 hours Average usage 4 hours 10 mins Pressure 5-20cm h20 (13.3cm h20-95%) Airleaks 7.3L/min (95%) AHI 0.7   SYMPTOM SCALE - ILD 11/02/2020  01/16/2021  04/16/2022   O2 use ra ra RA  Shortness of Breath 0 -> 5 scale with 5 being worst (score 6 If unable to do)    At rest 0 0 0  Simple tasks - showers, clothes change, eating, shaving 2 1 2   Household (dishes, doing bed, laundry) 2 1 2   Shopping 4 2 4   Walking level at own pace 3 2 2   Walking up Stairs 3 2 3   Total (30-36) Dyspnea Score 14 8 13   How bad is your cough? 0 0 0  How bad is your fatigue 3 3 3   How bad is nausea 0 2 2  How bad is vomiting?  0 1 0  How bad is diarrhea? 2 0- 0  How bad is anxiety? 1 2 1   How bad is depression 1 0 0  Brain fog 3` 1  3    No Known Allergies  Immunization History  Administered Date(s) Administered   Influenza,inj,Quad PF,6+ Mos 01/16/2021   Influenza,inj,Quad PF,6-35 Mos 04/21/2017   Pneumococcal Polysaccharide-23 11/22/2020   Tdap 06/05/2014    Past Medical History:  Diagnosis Date   Hyperlipidemia    Hypertension     Tobacco History: Social History   Tobacco Use  Smoking Status Never  Smokeless Tobacco Never   Counseling given: Not Answered   Outpatient Medications Prior to Visit  Medication Sig Dispense Refill   acetaminophen (TYLENOL) 500 MG tablet Take 500 mg by mouth every 6 (six) hours as needed for fever.     albuterol (VENTOLIN HFA) 108 (90 Base) MCG/ACT inhaler Inhale 2 puffs into the lungs every 6  (six) hours as needed for wheezing or shortness of breath. 8 g 12   atorvastatin (LIPITOR) 10 MG tablet Take 10 mg by mouth daily.     diclofenac Sodium (VOLTAREN) 1 % GEL Apply 2 g topically 4 (four) times daily. 100 g 0   lidocaine (LIDODERM) 5 % Place 1 patch onto the skin daily. Remove & Discard patch within 12 hours or as directed by MD 30 patch 0   losartan-hydrochlorothiazide (HYZAAR) 100-12.5 MG tablet Take 1 tablet by mouth daily.     methocarbamol (ROBAXIN) 500 MG tablet Take 1 tablet (500 mg total) by mouth 2 (two) times daily. 20 tablet 0   No facility-administered medications prior to visit.    Review of Systems  Review of Systems  Constitutional:  Positive for fatigue.  HENT: Negative.    Respiratory:  Positive for shortness of breath. Negative for cough, chest tightness and wheezing.   Cardiovascular: Negative.  Negative for leg swelling.   Physical Exam  BP 122/76 (BP Location: Left Arm, Patient Position: Sitting, Cuff Size: Normal)   Pulse 79   Temp 97.9 F (36.6 C) (Oral)   Ht 5\' 11"  (1.803 m)   Wt 259 lb (117.5 kg)   SpO2 97%   BMI 36.12 kg/m  Physical Exam Constitutional:      General: He is not in acute distress.    Appearance: Normal appearance. He is obese. He is not ill-appearing.  HENT:     Head: Normocephalic and atraumatic.     Mouth/Throat:     Pharynx: Oropharynx is clear.  Cardiovascular:     Rate and Rhythm: Normal rate and regular rhythm.  Pulmonary:     Effort: Pulmonary effort is normal.     Breath sounds: Normal breath sounds.  Musculoskeletal:        General: Normal range of motion.  Skin:    General: Skin is warm and dry.  Neurological:     General: No focal deficit present.     Mental Status: He is alert and oriented to person, place, and time. Mental status is at baseline.  Psychiatric:        Mood and Affect: Mood normal.        Behavior: Behavior normal.        Thought Content: Thought content normal.        Judgment:  Judgment normal.      Lab Results:  CBC    Component Value Date/Time   WBC 24.4 (H) 02/24/2020 0348   RBC 4.80 02/24/2020 0348   HGB 14.0 02/24/2020 0348   HCT 42.9 02/24/2020 0348   PLT 264 02/24/2020 0348   MCV 89.4 02/24/2020 0348   MCH 29.2 02/24/2020 0348   MCHC 32.6 02/24/2020  0348   RDW 15.3 02/24/2020 0348   LYMPHSABS 1.9 02/24/2020 0348   MONOABS 1.8 (H) 02/24/2020 0348   EOSABS 0.0 02/24/2020 0348   BASOSABS 0.0 02/24/2020 0348    BMET    Component Value Date/Time   NA 136 02/26/2020 0411   K 5.0 02/26/2020 0411   CL 100 02/26/2020 0411   CO2 24 02/26/2020 0411   GLUCOSE 174 (H) 02/26/2020 0411   BUN 24 (H) 02/26/2020 0411   CREATININE 0.72 02/26/2020 0411   CALCIUM 9.1 02/26/2020 0411   GFRNONAA >60 02/26/2020 0411   GFRAA >60 11/09/2017 0952    BNP No results found for: "BNP"  ProBNP No results found for: "PROBNP"  Imaging: No results found.   Assessment & Plan:   OSA (obstructive sleep apnea) - Home sleep study October 2022 showed severe obstructive sleep apnea.  Managed by Dr. Maple Hudson.  Patient is moderately compliant with CPAP use.  Current pressure setting 5 to 20 cm H2O (13.3 cm H2O 95%) with residual AHI 0.7 an hour.  Encourage patient aim to wear CPAP more consistently.   Chronic respiratory failure with hypoxia (HCC) - Continue supplemental oxygen with CPAP at night  Post-COVID chronic dyspnea - Chronic dyspnea is baseline. He gets winded with moderate-heavy exertion. He is fine a rest. No significant cough.  - CT chest imaging, PFTs and echocardiogram remain stable compared to previous  - Continue to encourage weight loss and exercise  - Long term disability paperwork being completed by Dr. Leavy Cella, NP 04/16/2022

## 2022-04-16 NOTE — Assessment & Plan Note (Addendum)
-   Home sleep study October 2022 showed severe obstructive sleep apnea.  Managed by Dr. Maple Hudson.  Patient is moderately compliant with CPAP use.  Current pressure setting 5 to 20 cm H2O (13.3 cm H2O 95%) with residual AHI 0.7 an hour.  Encourage patient aim to wear CPAP more consistently.

## 2022-04-16 NOTE — Assessment & Plan Note (Signed)
-   Continue supplemental oxygen with CPAP at night

## 2022-04-17 NOTE — Telephone Encounter (Signed)
Completed the long term disability form and sent to Dr. Marchelle Gearing for signature. Patient will need to be charged the $29 processing fee because it has been over a year since he last paid to have paperwork filled out.

## 2022-04-18 DIAGNOSIS — Z0289 Encounter for other administrative examinations: Secondary | ICD-10-CM

## 2022-04-18 NOTE — Telephone Encounter (Signed)
Disability extension signed by Dr. Marchelle Gearing - faxed to [facility] fax# [phone].   Mailed hard copy to patient.

## 2022-05-28 NOTE — Progress Notes (Deleted)
HPI 18 yoM never smoker followed for OSA, complicated by Bronchiectasis(Dr Ramaswamy), HTN, Covid pneumonia, June 2022, Chronic Respiratory Failure with Hypoxia, Elevated Hemidiaphragm, Hypercholesterolemia, Obesity, O2 2L used only for exertion HST 02/21/21- AHI 44.3/ hr, desaturation to 70%, Body weight 262 lbs  =================================================================================   11/25/21- 61 yoM never smoker followed for OSA, complicated by Bronchiectasis(Dr Ramaswamy), HTN, Covid pneumonia, June 2022, Chronic Respiratory Failure with Hypoxia, Elevated Hemidiaphragm, Hypercholesterolemia, Obesity, -Trelegy 100,  O2 2L used only for exertion CPAP auto 5-20/ [employer]  ordered 03/13/21      AirSense 11 AutoSet Download- compliance 20%, AHI 0.8/ hr Body weight today-262 lbs Not using Trelegy routinely Download reviewed. Admits he hasn't been bothering to use CPAP regularly. Sleeps better if he puts it on.  Very casual about use of O2 and inhalers. Education effort made today.  05/29/22-  61 yoM never smoker followed for OSA, complicated by Bronchiectasis(Dr Ramaswamy), HTN, Covid pneumonia, June 2022, Chronic Respiratory Failure with Hypoxia, Elevated Hemidiaphragm, Hypercholesterolemia, Obesity, Gr1DD,  -Trelegy 100,  O2 2L used only for exertion CPAP auto 5-20/ [employer]  ordered 03/13/21      AirSense 11 AutoSet Download- compliance Body weight today- Covid vax- Flu vax- Blanche East NP 04/1322 no changes for OSA    Dr Chase Caller follows general pulmonary.     HEENT:    headaches, difficulty swallowing, +tooth/dental problems, sore throat,       +sneezing, itching, ear ache, nasal congestion, post nasal drip, snoring CV:    chest pain, orthopnea, PND, swelling in lower extremities, anasarca,                                   dizziness, palpitations Resp:   +shortness of breath with exertion or at rest.                productive cough,   non-productive cough, coughing up of  blood.              change in color of mucus.  wheezing.   Skin:    rash or lesions. GI:  No-   heartburn, +indigestion, abdominal pain, nausea, vomiting, diarrhea,                 change in bowel habits, loss of appetite GU: dysuria, change in color of urine, no urgency or frequency.   flank pain. MS:   +joint pain, +stiffness, decreased range of motion, back pain. Neuro-     nothing unusual Psych:  change in mood or affect.  depression or anxiety.   memory loss.   OBJ- Physical Exam General- Alert, Oriented, Affect-appropriate, Distress- none acute, + obese  Skin- rash-none, lesions- none, excoriation- none Lymphadenopathy- none Head- atraumatic            Eyes- Gross vision intact, PERRLA, conjunctivae and secretions clear            Ears- Hearing, canals-normal            Nose- Clear, no-Septal dev, mucus, polyps, erosion, perforation             Throat- Mallampati IV , mucosa clear , drainage- none, tonsils- atrophic, + teeth Neck- flexible , trachea midline, no stridor , thyroid nl, carotid no bruit Chest - symmetrical excursion , unlabored           Heart/CV- RRR , no murmur , no gallop  , no rub, nl s1 s2                           -  JVD- none , edema- none, stasis changes- none, varices- none           Lung- clear to P&A, wheeze- none, cough- none , dullness-none, rub- none           Chest wall-  Abd-  Br/ Gen/ Rectal- Not done, not indicated Extrem- L knee elastic sleeve Neuro- grossly intact to observation

## 2022-05-29 ENCOUNTER — Ambulatory Visit: Payer: BC Managed Care – PPO | Admitting: Internal Medicine

## 2022-05-30 NOTE — Progress Notes (Deleted)
HPI 68 yoM never smoker followed for OSA, complicated by Bronchiectasis(Dr Ramaswamy), HTN, Covid pneumonia, June 2022, Chronic Respiratory Failure with Hypoxia, Elevated Hemidiaphragm, Hypercholesterolemia, Obesity, O2 2L used only for exertion HST 02/21/21- AHI 44.3/ hr, desaturation to 70%, Body weight 262 lbs  =================================================================================   11/25/21- 61 yoM never smoker followed for OSA, complicated by Bronchiectasis(Dr Ramaswamy), HTN, Covid pneumonia, June 2022, Chronic Respiratory Failure with Hypoxia, Elevated Hemidiaphragm, Hypercholesterolemia, Obesity, -Trelegy 100,  O2 2L used only for exertion CPAP auto 5-20/ [employer]  ordered 03/13/21      AirSense 11 AutoSet Download- compliance 20%, AHI 0.8/ hr Body weight today-262 lbs Not using Trelegy routinely Download reviewed. Admits he hasn't been bothering to use CPAP regularly. Sleeps better if he puts it on.  Very casual about use of O2 and inhalers. Education effort made today.  06/02/22-  61 yoM never smoker followed for OSA, complicated by Bronchiectasis(Dr Ramaswamy), HTN, Covid pneumonia, June 2022, Chronic Respiratory Failure with Hypoxia, Elevated Hemidiaphragm, Hypercholesterolemia, Obesity, Gr1DD,  -Trelegy 100,  O2 2L used only for exertion CPAP auto 5-20/ [employer]  ordered 03/13/21      AirSense 11 AutoSet Download- compliance Body weight today- Covid vax- Flu vax- Blanche East NP 04/1322 no changes for OSA    Dr Chase Caller follows general pulmonary.     HEENT:    headaches, difficulty swallowing, +tooth/dental problems, sore throat,       +sneezing, itching, ear ache, nasal congestion, post nasal drip, snoring CV:    chest pain, orthopnea, PND, swelling in lower extremities, anasarca,                                   dizziness, palpitations Resp:   +shortness of breath with exertion or at rest.                productive cough,   non-productive cough, coughing up of  blood.              change in color of mucus.  wheezing.   Skin:    rash or lesions. GI:  No-   heartburn, +indigestion, abdominal pain, nausea, vomiting, diarrhea,                 change in bowel habits, loss of appetite GU: dysuria, change in color of urine, no urgency or frequency.   flank pain. MS:   +joint pain, +stiffness, decreased range of motion, back pain. Neuro-     nothing unusual Psych:  change in mood or affect.  depression or anxiety.   memory loss.   OBJ- Physical Exam General- Alert, Oriented, Affect-appropriate, Distress- none acute, + obese  Skin- rash-none, lesions- none, excoriation- none Lymphadenopathy- none Head- atraumatic            Eyes- Gross vision intact, PERRLA, conjunctivae and secretions clear            Ears- Hearing, canals-normal            Nose- Clear, no-Septal dev, mucus, polyps, erosion, perforation             Throat- Mallampati IV , mucosa clear , drainage- none, tonsils- atrophic, + teeth Neck- flexible , trachea midline, no stridor , thyroid nl, carotid no bruit Chest - symmetrical excursion , unlabored           Heart/CV- RRR , no murmur , no gallop  , no rub, nl s1 s2                           -  JVD- none , edema- none, stasis changes- none, varices- none           Lung- clear to P&A, wheeze- none, cough- none , dullness-none, rub- none           Chest wall-  Abd-  Br/ Gen/ Rectal- Not done, not indicated Extrem- L knee elastic sleeve Neuro- grossly intact to observation

## 2022-06-02 ENCOUNTER — Ambulatory Visit: Payer: BC Managed Care – PPO | Admitting: Internal Medicine

## 2022-06-24 ENCOUNTER — Telehealth: Payer: Self-pay | Admitting: Primary Care

## 2022-06-24 NOTE — Telephone Encounter (Signed)
Error

## 2022-07-27 NOTE — Progress Notes (Signed)
HPI 21 yoM never smoker followed for OSA, complicated by Bronchiectasis(Dr Ramaswamy), HTN, Covid pneumonia, June 2022, Chronic Respiratory Failure with Hypoxia, Elevated Hemidiaphragm, Hypercholesterolemia, Obesity, O2 2L used only for exertion HST 02/21/21- AHI 44.3/ hr, desaturation to 70%, Body weight 262 lbs PFT 04/16/22-  FVC 3.12 (64%), FEV1 2.86 (78%), ratio 92, DLCOunc 17.08 (61%)  02/20/22 qualified for O2 2L HRCT-03/05/22-This chronic organizing pneumonia appearance is again specifically suggestive of chronic sequelae of prior COVID airspace disease. =================================================================================    11/25/21- 61 yoM never smoker followed for OSA, complicated by Bronchiectasis(Dr Ramaswamy), HTN, Covid pneumonia, June 2022, Chronic Respiratory Failure with Hypoxia, Elevated Hemidiaphragm, Hypercholesterolemia, Obesity, -Trelegy 100,  O2 2L used only for exertion CPAP auto 5-20/ [employer]  ordered 03/13/21      AirSense 11 AutoSet Download- compliance 20%, AHI 0.8/ hr Body weight today-262 lbs Not using Trelegy routinely Download reviewed. Admits he hasn't been bothering to use CPAP regularly. Sleeps better if he puts it on.  Very casual about use of O2 and inhalers. Education effort made today.  07/29/22- 62yoM never smoker followed for OSA, complicated by Bronchiectasis(Dr Ramaswamy), HTN, Covid pneumonia/Organizing Pneumonia, June 2022, Chronic Respiratory Failure with Hypoxia, Elevated Hemidiaphragm, Hypercholesterolemia, Obesity, -Trelegy 100, Ventolin hfa, O2 2L used only for exertion CPAP auto 5-20/ [employer]  ordered 03/13/21      AirSense 11 AutoSet Download- compliance 40/%, AHI 0.9/ hr Body weight today-259 lbs Dr Marchelle Gearing for general pulmonary/ resp failure 02/20/22- Long Covid LOV Walsh, NP 04/16/22- PFT 04/16/22-  FVC 3.12 (64%), FEV1 2.86 (78%), ratio 92, DLCOunc 17.08 (61%)  Download reviewed and compliance goals again  discussed. His breathing is comfortable.  He admits occasional dry cough.  Uses oxygen for exertion as needed-May be every 2 or 3 weeks.  He is not using his maintenance or rescue inhalers at all and denies significant wheeze.  Admits some dyspnea on exertion on stairs but not when riding an exercise bicycle at the gym. HRCT chest 03/05/22- IMPRESSION: 1. Unchanged appearance of the lungs with bibasilar predominant ground-glass and irregular interstitial opacity, as well as tubular bronchiectasis of the bilateral lung bases, with evidence of subpleural sparing. This chronic organizing pneumonia appearance is again specifically suggestive of chronic sequelae of prior COVID airspace disease. 2. Enlargement of the main pulmonary artery, as can be seen in pulmonary hypertension. 3. Hepatic steatosis. Aortic Atherosclerosis (ICD10-I70.0).    ROS-see HPI   + = positive Constitutional:    weight loss, night sweats, fevers, chills, fatigue, lassitude. HEENT:    headaches, difficulty swallowing, +tooth/dental problems, sore throat,       +sneezing, itching, ear ache, nasal congestion, post nasal drip, snoring CV:    chest pain, orthopnea, PND, swelling in lower extremities, anasarca,                                   dizziness, palpitations Resp:   +shortness of breath with exertion or at rest.                productive cough,   +non-productive cough, coughing up of blood.              change in color of mucus.  wheezing.   Skin:    rash or lesions. GI:  No-   heartburn, +indigestion, abdominal pain, nausea, vomiting, diarrhea,                 change in  bowel habits, loss of appetite GU: dysuria, change in color of urine, no urgency or frequency.   flank pain. MS:   +joint pain, +stiffness, decreased range of motion, back pain. Neuro-     nothing unusual Psych:  change in mood or affect.  depression or anxiety.   memory loss.  OBJ- Physical Exam General- Alert, Oriented, Affect-appropriate,  Distress- none acute, + obese  Skin- rash-none, lesions- none, excoriation- none Lymphadenopathy- none Head- atraumatic            Eyes- Gross vision intact, PERRLA, conjunctivae and secretions clear            Ears- Hearing, canals-normal            Nose- Clear, no-Septal dev, mucus, polyps, erosion, perforation             Throat- Mallampati IV , mucosa clear , drainage- none, tonsils- atrophic, + teeth Neck- flexible , trachea midline, no stridor , thyroid nl, carotid no bruit Chest - symmetrical excursion , unlabored           Heart/CV- RRR , no murmur , no gallop  , no rub, nl s1 s2                           - JVD- none , edema- none, stasis changes- none, varices- none           Lung- clear to P&A, wheeze- none, cough- none , dullness-none, rub- none           Chest wall-  Abd-  Br/ Gen/ Rectal- Not done, not indicated Extrem- L knee elastic sleeve Neuro- grossly intact to observation

## 2022-07-29 ENCOUNTER — Ambulatory Visit (INDEPENDENT_AMBULATORY_CARE_PROVIDER_SITE_OTHER): Payer: BC Managed Care – PPO | Admitting: Internal Medicine

## 2022-07-29 ENCOUNTER — Encounter: Payer: Self-pay | Admitting: Internal Medicine

## 2022-07-29 VITALS — BP 126/74 | HR 90 | Ht 72.0 in | Wt 259.2 lb

## 2022-07-29 DIAGNOSIS — G4733 Obstructive sleep apnea (adult) (pediatric): Secondary | ICD-10-CM

## 2022-07-29 DIAGNOSIS — J9611 Chronic respiratory failure with hypoxia: Secondary | ICD-10-CM | POA: Diagnosis not present

## 2022-07-29 NOTE — Patient Instructions (Addendum)
Ok not to use the Trelegy and ventolin inhalers if they don't seem to help  As discussed- try to use your CPAP every night if you can  Order- DME [employer]- continue auto 5-20. Please replace mask of choice, hoses, filters, supplies

## 2022-09-04 NOTE — Patient Instructions (Addendum)
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 CT and PFT are stable   Plan - do full spiro/dlco in 6 months - recommend weight loss  Followup  - 6 months with APP to disuss followup PFT

## 2022-09-04 NOTE — Progress Notes (Signed)
OV 11/02/2020  Subjective:  Patient ID: Darren Wood, male , DOB: May 16, 1960 , age 62 y.o. , MRN: [id number] , ADDRESS: [address] [place] [address] PCP Jefm Petty, MD Patient Care Team: Jefm Petty, MD as PCP - General (Family Medicine)  This Provider for this visit: Treatment Team:  Attending Provider: Brand Males, MD    11/02/2020 -   Chief Complaint  Patient presents with   Consult    Pt is being referred by Lazaro Arms, NP due to SOB from covid.  Pt states he had Covid Oct 2021 and states he is still having problems with SOB that happens with activities.     HPI Darren Wood 62 y.o. -history is gained from talking to the patient and also review of the records.  He has a history of hypertension, obesity class II, dyslipidemia and DJD.  He tested positive for COVID on 02/07/2020.  He was admitted between 02/13/2020 through 02/26/2020 with COVID-19 at the [facility] health system.  He had an abnormal chest x-ray that is documented below.  He was treated with oxygen, remdesivir, steroids and tocilizumab.  He also got a short course of antibiotics for concern for aspiration pneumonia.  He also got nystatin for thrush.  He was discharged on oxygen.   In January 2022 he had CT scan of the chest at [facility] that showed some bronchiectasis.  Elevation of right diaphragm also noted.  His ANA was negative./Borderline positive.  It is unclear to me if a sniff test was done.  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  He is also complaining of brain fog.  Review of the outside records indicat that he saw Dr. Esaw Dace [facility] pulmonary fellow as recently as May 2022.  His assessment and plan as listed below.  1. Post-COVID syndrome  2. Restrictive lung disease  3. Elevated hemidiaphragm  4. Chronic respiratory failure with hypoxia (HCC)   #1 post COVID  syndrome Resume use of Flovent 110 inhaler twice daily as directed; I reminded him that he has refills available to him through 04/24/2021 Continue use of albuterol inhaler as needed for chest congestion, chest tightness or shortness of breath Referral to long COVID program at [place] with Dr. Ned Clines [phone]; [facility] [address], [place] [address]  2. Restrictive lung disease 3. Elevated hemidiaphragm PFT 04/30/2020 ratio 91% FEV1 59% FVC 50% DLCO 51% Right hemidiaphragm noted and is contributing to overall dyspnea on exertion  4. Chronic respiratory failure 6-minute walk test conducted today again shows oxygen desaturation on exertion with need for 2 L/min supplemental oxygen Continue use of supplemental oxygen to maintain pulse ox readings between 88% and 93% The referral for pulmonary rehab was placed 6 to 8 weeks ago. He has not yet gotten a place in the pulmonary rehab program. He will start the pulmonary rehab as soon as he is notified.  Short-term disability form completed to have him out of work 09/21/2020 - 01/03/2021  On 10/04/2020 he visited with Lazaro Arms, [place] nurse practitioner.  At this time he expressed symptoms of dyspnea with exertion, myalgias, arthralgias, memory loss.  This is despite Flovent and doing pulm rehabilitation.  It was noticed that he was still requiring 2 L with exertion.  He denied any fever chills.  Denied any nausea vomiting diarrhea.  Denied any hemoptysis.  Denied chest pain or edema.  Therefore pulmonary referral was made.  He then started pulmonary rehabilitation on 10/09/2020, he presents to the pulmonary clinic on 11/02/2020 for new consult evaluation.  He lives in [place] he and his wife state that [place] is closer.  He is happy to continue and establish with the follow-up here.  At some point he will need his disability refill.  At this point in time he is complaining of significant dyspnea on exertion along with  cognitive processing issues post-COVID.  In talking to him his Epworth sleepiness score is 10.  His wife says he snores a lot and does have apneic spells at night.  Never been tested or diagnosed with sleep apnea.  No known heart issues.  Overall course is 1 of improvement but he still feels he is 20-30% you need to make in order to feel fully optimal.  At home he uses oxygen with exertion but not at rest or walking room to room.  He was surprised that in a walking desaturation test today he did not desaturate to the point he needs oxygen.  At rehab he is using 2 L.     CXR Oct 2021  Narrative & Impression  CLINICAL DATA:  Hypoxia.  COVID positive   EXAM: PORTABLE CHEST 1 VIEW   COMPARISON:  02/13/2020   FINDINGS: Stable mild bilateral infiltrate and low lung volumes. No visible effusion or pneumothorax. Stable heart size.   IMPRESSION: Stable low volume chest with mild infiltrates.     Electronically Signed   By: Monte Fantasia M.D.   On: 02/20/2020 07:57     Jan 2022 CT at [facility]:  There is tubular bronchiectasis throughout, worst in the bilateral  lower lobes, with peripheral irregular and ground-glass airspace  opacity, including bandlike elements with subpleural sparing. This  appearance is consistent with chronic sequelae of organizing  pneumonia and particularly is in keeping with chronic post  infectious scarring related to prior COVID 19 airspace disease.    Electronically Signed    By: Eddie Candle M.D.    On: 05/16/2020 13:05   has a past medical history of Hypertension.   reports that he has never smoked. He has never used smokeless tobacco.   12/19/20- 62 yoM never smoker for sleep evaluation courtesy of Dr Renette Butters 11/02/20) - Dr Annamaria Boots Sleep Medical problem list includes Bronchiectasis, HTN, Covid pneumonia, June 2022, Chronic Respiratory Failure with Hypoxia, Elevated Hemidiaphragm, Hypercholesterolemia, Obesity, O2 2L used only for  exertion Epworth score-4 Body weight today-262 lbs Covid vax- Complains of snoring, tiredd, waking often, tossing and turning. No ENT surgery. No lung problems prior to Covid infection. No heart probs. No parasomnias. Says lost 40 lbs related to Covid infection.  CT chest 11/29/20- IMPRESSION: 1. Pulmonary parenchymal pattern of peripheral and basilar predominant coarsened ground-glass and bronchiectasis/bronchiolectasis, minimally improved from 05/16/2020 and in keeping with sequelae of COVID-19 pneumonia. 2. Hepatic steatosis. 3. Enlarged pulmonary arteries, indicative of pulmonary arterial hypertension.    OV 01/16/2021  Subjective:  Patient ID: Darren Wood, male , DOB: July 02, 1960 , age 6 y.o. , MRN: [id number] , AD[address]ESS: [address] [place] [address] PCP Jefm Petty, MD Patient Care Team: Jefm Petty, MD as PCP - General (Family Medicine)  This Provider for this visit: Treatment Team:  Attending Provider: Brand Males, MD    01/16/2021 -   Chief Complaint  Patient presents with   Follow-up  Pt states he has been doing okay since last visit. States he still becomes winded when he exerts himself.   COVID long-haul follow-up  HPI Darren Wood 62 y.o. -presents for follow-up.  He presents with his wife.  Since last seeing me he has had his echocardiogram that probably shows diastolic dysfunction.  He had a high-resolution CT scan that shows minimal ILD changes.  He has attended pulmonary rehabilitation.  He feels this improved him.  He has seen Dr. Annamaria Boots and outpatient home sleep study is pending.  He says that since his COVID last year he continues to improve.  Every few month interval he is continue to improve.  In fact his ILD symptom score show improvement.  However he feels he is no longer able to do physical work.  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  In the past he did physical work.  Before he got COVID he was completely fine.  He was unvaccinated and had to  be hospitalized.  He feels his brain fog is also improved with a still brain fog.  He is awaiting Social Security disability and long-term disability through his employer.  He wants to be written out till end of the year which apparently already have.  He wants to come back in December 2022 to see if he is better off his disability needs to be extended.  I have emphasized to him that much is unknown about COVID long-haul but he does have to motivate himself to lose weight and get physically fit.  He is agreed to be referred for a weight loss program.       PFT   1. Left ventricular ejection fraction, by estimation, is 60 to 65%. The  left ventricle has normal function. The left ventricle has no regional  wall motion abnormalities- though technically difficult study. There is  moderate concentric left ventricular  hypertrophy. Left ventricular diastolic parameters are consistent with  Grade I diastolic dysfunction (impaired relaxation).   2. Right ventricular systolic function is normal. The right ventricular  size is normal. Tricuspid regurgitation signal is inadequate for assessing  PA pressure.   3. Left atrial size was mildly dilated.   4. The mitral valve is normal in structure. No evidence of mitral valve  regurgitation. No evidence of mitral stenosis.   5. The aortic valve is tricuspid. Aortic valve regurgitation is not  visualized. No aortic stenosis is present.   6. Aortic dilatation noted. There is mild dilatation of the ascending  aorta, measuring 40 mm.   Comparison(s): No prior Echocardiogram. Consider use of echo-contrast in  future studies.    OV 04/11/2021  Subjective:  Patient ID: Darren Wood, male , DOB: Oct 08, 1960 , age 46 y.o. , MRN: [id number] , AD[address]ESS: [address] [place] [address] PCP Jefm Petty, MD Patient Care Team: Jefm Petty, MD as PCP - General (Family Medicine)  This Provider for this visit: Treatment Team:  Attending Provider:  Brand Males, MD    04/11/2021 -   Chief Complaint  Patient presents with   Follow-up    Surgery clearance   COVID long-haul follow-up  HPI Darren Wood 62 y.o. -returns for follow-up.  In the interim is been approved for Social Security disability.  His employer has approved him for long-term disability.  Today I signed the form for [facility] claims.  His long-haul physical rehab exercises have been limited now because of development of hip arthritis.  He wants a hip surgery.  He is here  for preoperative clearance.  Wife also wants to know if he can take the COVID by Valent mRNA vaccine.  His COVID was over a year ago.  Was probably delta variant.  That was the dominant strain at that time.  He is also had a left-sided sciatic nerve pain flare.  In the interim his been diagnosed with sleep apnea.  He is going to start CPAP tonight.  He uses oxygen at [facility] Chest data  No results found.  OV 02/20/2022  Subjective:  Patient ID: Darren Wood, male , DOB: 1960/06/12 , age 70 y.o. , MRN: [id number] , ADDRESS: [address] [place] [address] PCP Loyal Jacobson, MD Patient Care Team: Loyal Jacobson, MD as PCP - General (Family Medicine)  This Provider for this visit: Treatment Team:  Attending Provider: Kalman Shan, MD    02/20/2022 -   Chief Complaint  Patient presents with   Follow-up    Breathing good unless increased exertion.  Would like a handicap car app.  Oxygen 2L prn during day.  CPAP at bedtime.     HPI Darren Wood 62 y.o. -returns for follow-up.  Not seen since December 202.  He states he is physically stronger he is driving right now.  He gets around with a cane.  He is doing ADLs.  Nevertheless he still has dyspnea on exertion.  Last pulmonary function test, CT scan and echocardiogram all greater than 1 year ago.  His obesity continues.  Otherwise no big issues.  He has had his flu shot.  He plans to have the RSV shot but he does not want to have the  COVID mRNA vaccine.       OV 09/05/2022  PFT Subjective:  Patient ID: Darren Wood, male , DOB: 03-30-1961 , age 69 y.o. , MRN: [id number] , ADDRESS: [address] [place] [address] PCP Loyal Jacobson, MD Patient Care Team: Loyal Jacobson, MD as PCP - General (Family Medicine)  This Provider for this visit: Treatment Team:  Attending Provider: Kalman Shan, MD   Obesity OSA on CPAP since December 2022 follows with Dr. Jetty Duhamel Physical deconditioning Grade 1 diastolic dysfunction History of COVID-21 February 2020 known with hospitalization subsequent COVID long-haul dyspnea  09/05/2022 -follow-up post COVID interstitial lung abnormalities and dyspnea   HPI Darren Wood 62 y.o. -follow-up for post-COVID dyspnea with the interstitial lung changes.  I last saw him in October 2023.  After that he had a high-resolution CT chest that showed enlarged pulmonary arteries.  We did an echo this is normal.  He has mild LVH.  He continues to have dyspnea although he states that he is able to exercise on a bike for 90 minutes but when he climbs 7 steps he can get dyspneic.  But overall he feels stable.  Pulmonary function test that is stable.  The interstitial abnormalities that I personally visualized are mild and it is also stable over time.  Pulmonary function test is stable.  We did a sit/stand test.  Made him sit and stand 10 times.  His pulse ox went from 98% to 96%.  He got slightly tachycardic and very mild shortness of breath.  He does have crackles in the lung base.  His obesity continues and he is to talk to primary care physician about starting weight loss drugs.      SYMPTOM SCALE - ILD 11/02/2020  01/16/2021   O2 use ra ra  Shortness of Breath 0 -> 5 scale with 5 being  worst (score 6 If unable to do)   At rest 0 0  Simple tasks - showers, clothes change, eating, shaving 2 1  Household (dishes, doing bed, laundry) 2 1  Shopping 4 2  Walking level at own pace 3 2   Walking up Stairs 3 2  Total (30-36) Dyspnea Score 14 8  How bad is your cough? 0 0  How bad is your fatigue 3 3  How bad is nausea 0 2  How bad is vomiting?  0 1  How bad is diarrhea? 2 0-  How bad is anxiety? 1 2  How bad is depression 1 0  Brain fog 3` 1        Simple office walk 185 feet x  3 laps goal with forehead probe 11/02/2020 - slow pace with cane  01/16/2021  09/05/2022   O2 used Ra (has o2 at  home since covid oct 2021 but this walk is RA)    Number laps completed 3  3 Sit stand x 10 times  Comments about pace Slow with cane Slow pace with cane Good pace  Resting Pulse Ox/HR 100% and 94/min 100% and 90 98%  Final Pulse Ox/HR 94% and 132/min 96% and 126/,on 96%  Desaturated </= 88% no no   Desaturated <= 3% points Yes, 6 potnt Ues, 4 ppt   Got Tachycardic >/= 90/min yes yes   Symptoms at end of test Moderate dyspnea Moderate dyspnea Very mild dysnea  Miscellaneous comments x      PFT     Latest Ref Rng & Units 04/16/2022   10:28 AM 12/05/2020    2:45 PM  PFT Results  FVC-Pre L 3.06  P 3.01   FVC-Predicted Pre % 63  P 72   FVC-Post L 3.12  P 2.99   FVC-Predicted Post % 64  P 72   Pre FEV1/FVC % % 89  P 90   Post FEV1/FCV % % 92  P 90   FEV1-Pre L 2.72  P 2.71   FEV1-Predicted Pre % 74  P 84   FEV1-Post L 2.86  P 2.69   DLCO uncorrected ml/min/mmHg 17.08  P 15.70   DLCO UNC% % 61  P 55   DLCO corrected ml/min/mmHg 17.08  P 16.02   DLCO COR %Predicted % 61  P 56   DLVA Predicted % 94  P 93     P Preliminary result   CT chest OCt 2023  CLINICAL DATA:  Interstitial lung disease, chronic dyspnea, history of COVID   EXAM: CT CHEST WITHOUT CONTRAST   TECHNIQUE: Multidetector CT imaging of the chest was performed following the standard protocol without intravenous contrast. High resolution imaging of the lungs, as well as inspiratory and expiratory imaging, was performed.   RADIATION DOSE REDUCTION: This exam was performed according to  the departmental dose-optimization program which includes automated exposure control, adjustment of the mA and/or kV according to patient size and/or use of iterative reconstruction technique.   COMPARISON:  11/28/2020 05/05/2020   FINDINGS: Cardiovascular: Scattered aortic atherosclerosis. Normal heart size. Enlargement of the main pulmonary artery measuring up to 3.6 cm in caliber. No pericardial effusion.   Mediastinum/Nodes: No enlarged mediastinal, hilar, or axillary lymph nodes. Small hiatal hernia. Thyroid gland, trachea, and esophagus demonstrate no significant findings.   Lungs/Pleura: Unchanged appearance of the lungs with bibasilar predominant ground-glass and irregular interstitial opacity, as well as tubular bronchiectasis of the bilateral lung bases, with evidence of subpleural sparing. No pleural  effusion or pneumothorax.   Upper Abdomen: No acute abnormality.  Hepatic steatosis.   Musculoskeletal: No chest wall abnormality. No acute osseous findings.   IMPRESSION: 1. Unchanged appearance of the lungs with bibasilar predominant ground-glass and irregular interstitial opacity, as well as tubular bronchiectasis of the bilateral lung bases, with evidence of subpleural sparing. This chronic organizing pneumonia appearance is again specifically suggestive of chronic sequelae of prior COVID airspace disease. 2. Enlargement of the main pulmonary artery, as can be seen in pulmonary hypertension. 3. Hepatic steatosis.   Aortic Atherosclerosis (ICD10-I70.0).     Electronically Signed   By: Jearld Lesch M.D.   On: 03/05/2022 11:57   ECHO NOV 2023  IMPRESSIONS     1. Left ventricular ejection fraction, by estimation, is 60 to 65%. The  left ventricle has normal function. The left ventricle has no regional  wall motion abnormalities. Left ventricular diastolic parameters are  consistent with Grade I diastolic  dysfunction (impaired relaxation).   2. Right  ventricular systolic function is normal. The right ventricular  size is normal.   3. Left atrial size was moderately dilated.   4. The mitral valve is normal in structure. No evidence of mitral valve  regurgitation. No evidence of mitral stenosis.   5. The aortic valve is normal in structure. Aortic valve regurgitation is  not visualized. No aortic stenosis is present.   6. Aneurysm of the ascending aorta, measuring 43 mm.   7. The inferior vena cava is normal in size with greater than 50%  respiratory variability, suggesting right atrial pressure of 3 mmHg.   Comparison(s): EF 60%, mod LVH.    has a past medical history of Hyperlipidemia and Hypertension.   reports that he has never smoked. He has never used smokeless tobacco.  Past Surgical History:  Procedure Laterality Date   REVISION TOTAL HIP ARTHROPLASTY      No Known Allergies  Immunization History  Administered Date(s) Administered   Influenza,inj,Quad PF,6+ Mos 01/16/2021, 01/08/2022   Influenza,inj,Quad PF,6-35 Mos 04/21/2017   Pneumococcal Polysaccharide-23 11/22/2020   Tdap 06/05/2014    No family history on file.   Current Outpatient Medications:    acetaminophen (TYLENOL) 500 MG tablet, Take 500 mg by mouth every 6 (six) hours as needed for fever., Disp: , Rfl:    albuterol (VENTOLIN HFA) 108 (90 Base) MCG/ACT inhaler, Inhale 2 puffs into the lungs every 6 (six) hours as needed for wheezing or shortness of breath., Disp: 8 g, Rfl: 12   atorvastatin (LIPITOR) 10 MG tablet, Take 10 mg by mouth daily., Disp: , Rfl:    diclofenac Sodium (VOLTAREN) 1 % GEL, Apply 2 g topically 4 (four) times daily., Disp: 100 g, Rfl: 0   lidocaine (LIDODERM) 5 %, Place 1 patch onto the skin daily. Remove & Discard patch within 12 hours or as directed by MD, Disp: 30 patch, Rfl: 0   losartan-hydrochlorothiazide (HYZAAR) 100-12.5 MG tablet, Take 1 tablet by mouth daily., Disp: , Rfl:    methocarbamol (ROBAXIN) 500 MG tablet, Take 1  tablet (500 mg total) by mouth 2 (two) times daily., Disp: 20 tablet, Rfl: 0      Objective:   Vitals:   09/05/22 1100  BP: (!) 140/90  Pulse: 75  SpO2: 98%  Weight: 262 lb 6.4 oz (119 kg)  Height: 6\' 1"  (1.854 m)    Estimated body mass index is 34.62 kg/m as calculated from the following:   Height as of this encounter: 6\' 1"  (1.854  m).   Weight as of this encounter: 262 lb 6.4 oz (119 kg).  @WEIGHTCHANGE @  [employer]   09/05/22 1100  Weight: 262 lb 6.4 oz (119 kg)     Physical Exam    General: No distress. obee Neuro: Alert and Oriented x 3. GCS 15. Speech normal Psych: Pleasant Resp:  Barrel Chest - no.  Wheeze - no, Crackles - yes some at base, No overt respiratory distress CVS: Normal heart sounds. Murmurs - n Ext: Stigmata of Connective Tissue Disease - no HEENT: Normal upper airway. PEERL +. No post nasal drip        Assessment:       ICD-10-CM   1. Post-COVID chronic dyspnea  R06.09 Pulmonary function test   U09.9 CANCELED: Pulmonary function test    2. DOE (dyspnea on exertion)  R06.09     3. ILD (interstitial lung disease) (HCC)  J84.9          Plan:     Patient Instructions  History of 2019 novel coronavirus disease (COVID-19)- oct 2021 Post-COVID-19 syndrome manifesting as chronic dyspnea Abnormal CT of the chest Physical deconditioning Obesityy   - covid long haul continues to improve slowly but still prsent with shortness of breath with exertrion esp with stairs Is likely du eto weight and heart muscle stiffness. CT and PFT are stable   Plan - do full spiro/dlco in 6 months - recommend weight loss  Followup  - 6 months with APP to disuss followup PFT    SIGNATURE    Dr. Kalman Shan, M.D., F.C.C.P,  Pulmonary and Critical Care Medicine Staff Physician, [facility] Director - Interstitial Lung Disease  Program  Pulmonary Fibrosis [facility] at [facility] [place], [place],  [address]  Pager: [phone], If no answer or between  15:00h - 7:00h: call [phone] Telephone: [phone]  11:31 AM 09/05/2022

## 2022-09-05 ENCOUNTER — Ambulatory Visit: Payer: BC Managed Care – PPO | Admitting: Internal Medicine

## 2022-09-05 ENCOUNTER — Encounter: Payer: Self-pay | Admitting: Internal Medicine

## 2022-09-05 VITALS — BP 140/90 | HR 75 | Ht 73.0 in | Wt 262.4 lb

## 2022-09-05 DIAGNOSIS — U099 Post covid-19 condition, unspecified: Secondary | ICD-10-CM

## 2022-09-05 DIAGNOSIS — R0609 Other forms of dyspnea: Secondary | ICD-10-CM

## 2022-09-05 DIAGNOSIS — J849 Interstitial pulmonary disease, unspecified: Secondary | ICD-10-CM

## 2022-09-07 ENCOUNTER — Encounter: Payer: Self-pay | Admitting: Internal Medicine

## 2022-09-07 NOTE — Assessment & Plan Note (Signed)
Effects from CPAP when used. Plan-continue auto 5-20.  Educated again on compliance goals.  DME to refit mask, replace filters and supplies.

## 2022-09-07 NOTE — Assessment & Plan Note (Signed)
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 Plan-follow-up as planned with Dr. Marchelle Gearing

## 2022-12-15 ENCOUNTER — Telehealth: Payer: Self-pay | Admitting: Internal Medicine

## 2022-12-15 NOTE — Telephone Encounter (Signed)
Patient calling to speak with a nurse regarding his medication list. Please call back at [phone]

## 2022-12-19 NOTE — Telephone Encounter (Signed)
Left message for patient to call back or send a mychart message with details regarding his call.

## 2023-01-01 IMAGING — CT CT CHEST HIGH RESOLUTION W/O CM
2 of 8 series · 14 of 36 positions shown, 17 images · non-contrast
Comparison: 05/16/2020.

CLINICAL DATA: Shortness of breath on exertion. Post COVID
syndrome.

EXAM:
CT CHEST WITHOUT CONTRAST
TECHNIQUE: Multidetector CT imaging of the chest was performed following the
standard protocol without intravenous contrast. High resolution
imaging of the lungs, as well as inspiratory and expiratory imaging,
was performed.

[Series 5: high resolution · axial · 0.74mm/px · z∈[-306,-42]mm · 11 of 318 slices shown, 14 images]
[im 27/318  mediastinal]
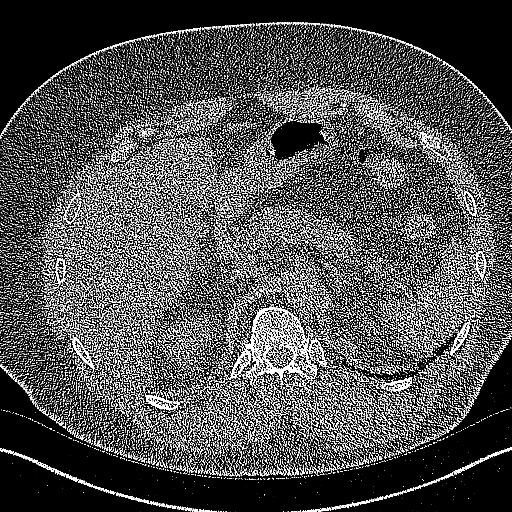
[im 27/318  lung]
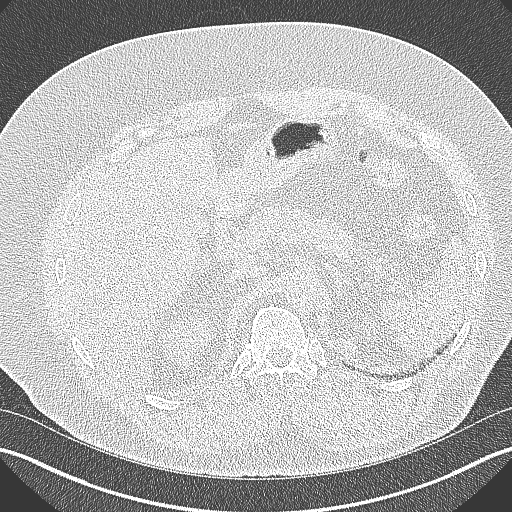
[im 53/318  lung]
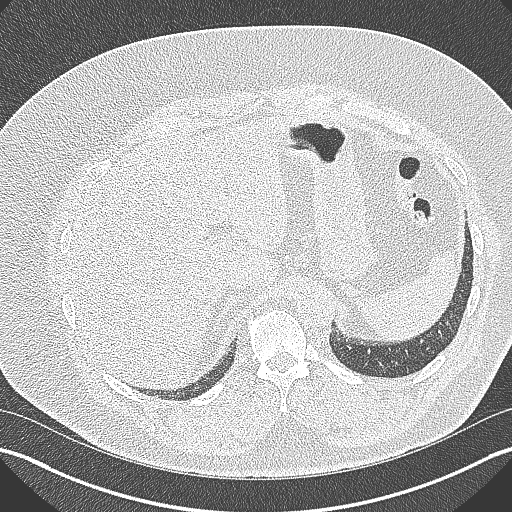
[im 80/318  lung]
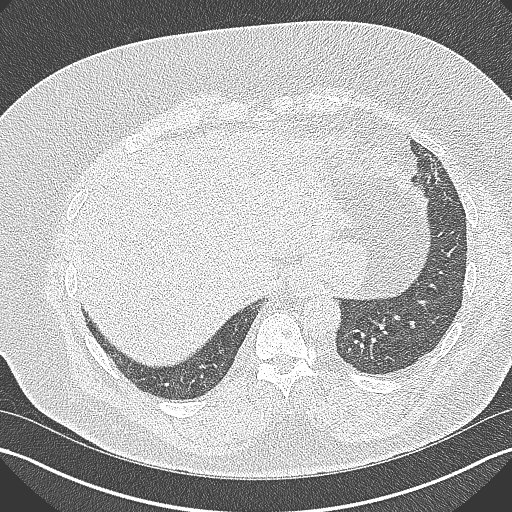
[im 106/318  lung]
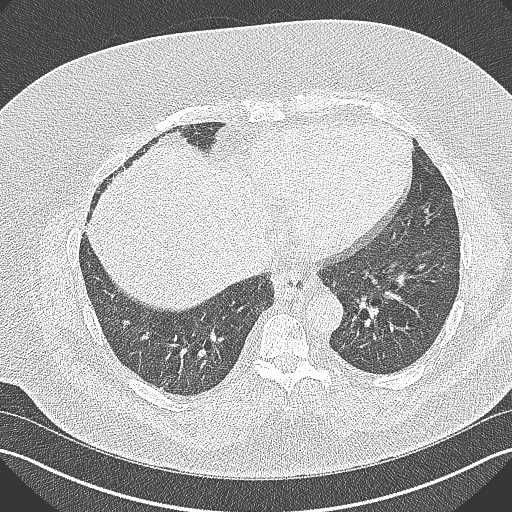
[im 133/318  mediastinal]
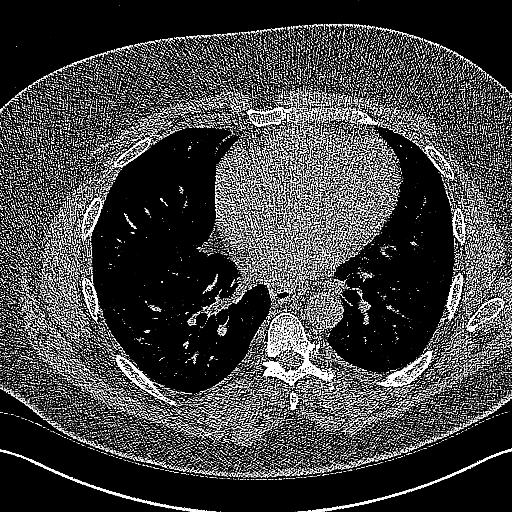
[im 133/318  lung]
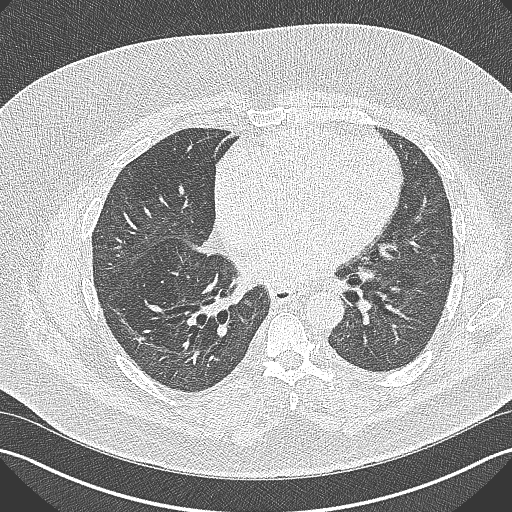
[im 159/318  lung]
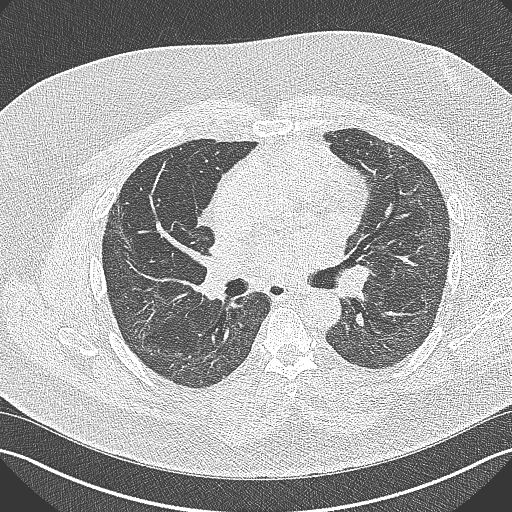
[im 185/318  lung]
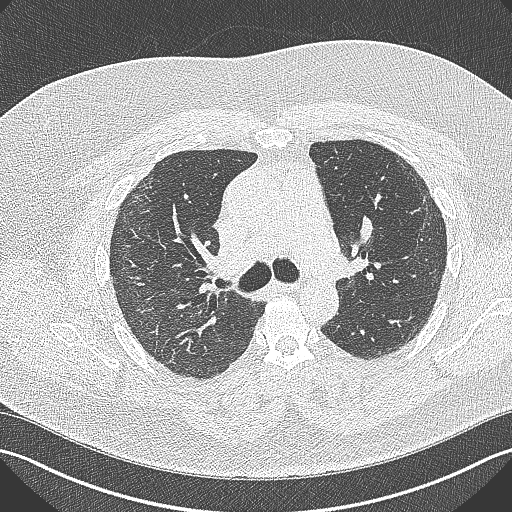
[im 212/318  lung]
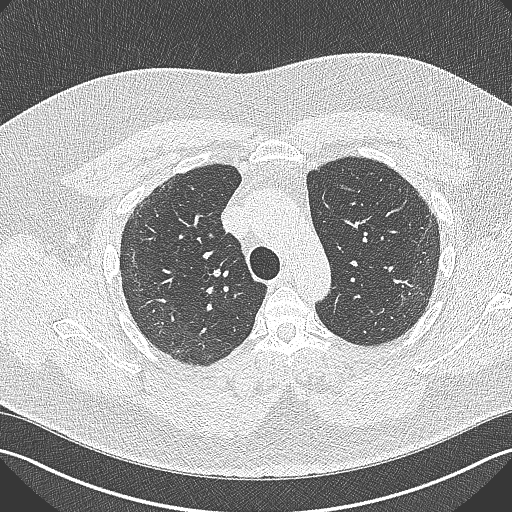
[im 238/318  mediastinal]
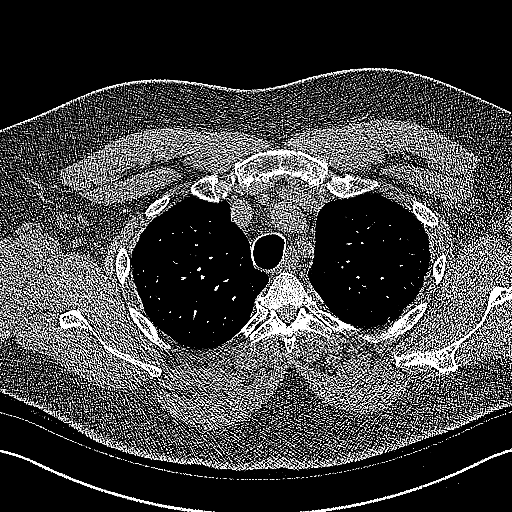
[im 238/318  lung]
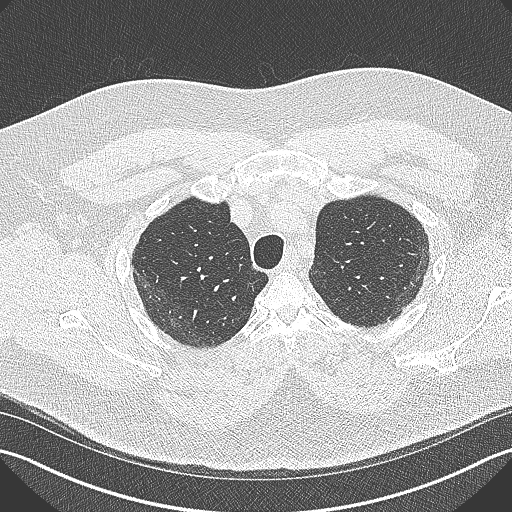
[im 265/318  lung]
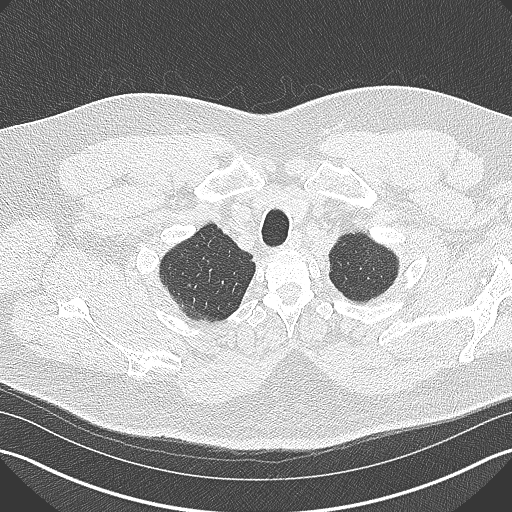
[im 291/318  lung]
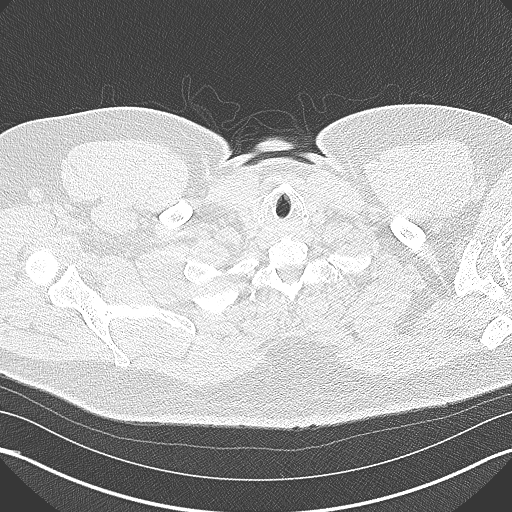

[Series 8: coronal · coronal · 0.63mm/px · 3 of 121 slices shown]
[im 25/121  lung]
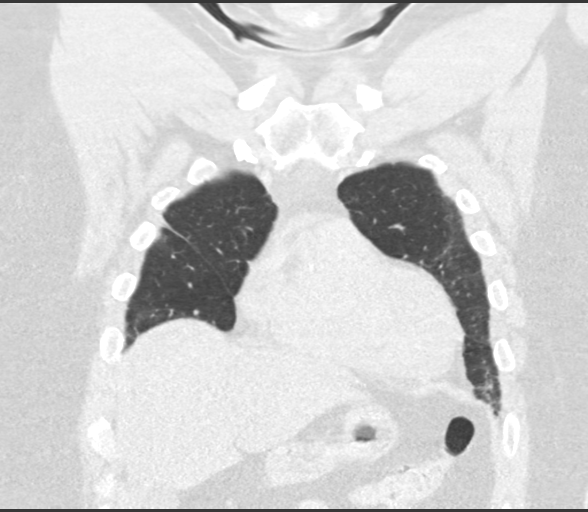
[im 49/121  lung]
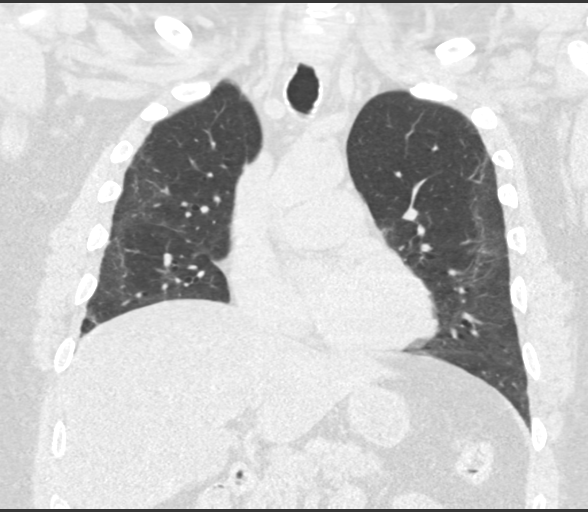
[im 73/121  lung]
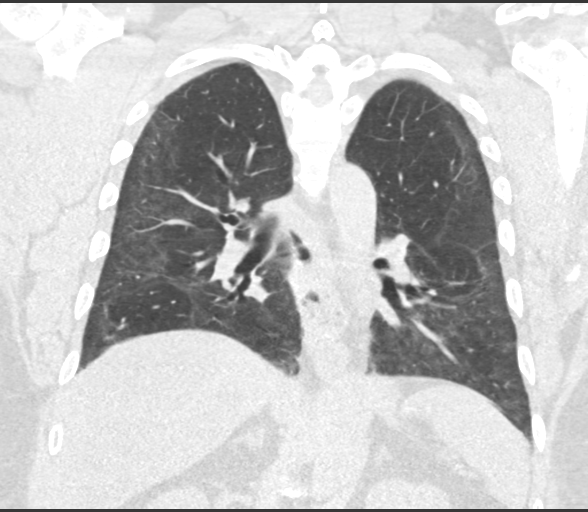

[14 of 36 positions shown; findings below may reference images not displayed]

FINDINGS: Cardiovascular: Enlarged pulmonary arteries and heart. No
pericardial effusion.

Mediastinum/Nodes: No pathologically enlarged mediastinal or
axillary lymph nodes. Hilar regions are difficult to definitively
evaluate without IV contrast. Esophagus is grossly unremarkable.

Lungs/Pleura: Peripheral and basilar coarsened ground-glass with
bronchiectasis/bronchiolectasis. Findings appear minimally improved
from 05/16/2020. No subpleural reticulation or honeycombing.
Scattered small subpleural lymph nodes along the fissures. No
pleural fluid. Airway is unremarkable. No air trapping.

Upper Abdomen: Liver is decreased in attenuation diffusely.
Visualized portions of the liver, gallbladder, adrenal glands,
kidneys, spleen, pancreas, stomach and bowel are unremarkable with
exception of a small hiatal hernia. No upper abdominal adenopathy.

Musculoskeletal: Degenerative changes in the spine. No worrisome
lytic or sclerotic lesions.
IMPRESSION: 1. Pulmonary parenchymal pattern of peripheral and basilar
predominant coarsened ground-glass and
bronchiectasis/bronchiolectasis, minimally improved from 05/16/2020
and in keeping with sequelae of CH8ES-4I pneumonia.
2. Hepatic steatosis.
3. Enlarged pulmonary arteries, indicative of pulmonary arterial
hypertension.

## 2023-01-07 ENCOUNTER — Telehealth: Payer: Self-pay | Admitting: Internal Medicine

## 2023-01-07 NOTE — Telephone Encounter (Signed)
Insurance Denied due to diagnoses

## 2023-01-12 NOTE — Telephone Encounter (Signed)
Patient not seen since 09/05/22. No clue as to what has been denied.   Attempted to contact patient. No answer. Voicemail left requesting return call.

## 2023-01-14 NOTE — Telephone Encounter (Signed)
LMOM for patient to return call for more details of what is needed.

## 2023-01-14 NOTE — Telephone Encounter (Signed)
Patients wife called back she stated that they applied for life insurance and they were denied due to diagnosis of COPD and bronchitis. She asked if we had that listed as problems in his chart. I told her I did not see them listed in his problem list. She is going to call and see where they got those diagnosis and call us back if needed.

## 2023-04-22 ENCOUNTER — Telehealth: Payer: Self-pay | Admitting: Internal Medicine

## 2023-04-22 NOTE — Telephone Encounter (Signed)
Received a disability renewal form from [place] on 12/4 - Dr. Marchelle Gearing wrote a note on it that since patient has not been seen since May 2024, and he was supposed to have a 6 month follow-up, he needs to have an office visit before Dr. Marchelle Gearing will fill out the form.  I have let the front staff know to call patient to set up this appointment.

## 2023-05-14 ENCOUNTER — Telehealth (INDEPENDENT_AMBULATORY_CARE_PROVIDER_SITE_OTHER): Payer: BC Managed Care – PPO | Admitting: Internal Medicine

## 2023-05-14 DIAGNOSIS — R5381 Other malaise: Secondary | ICD-10-CM

## 2023-05-14 DIAGNOSIS — U099 Post covid-19 condition, unspecified: Secondary | ICD-10-CM

## 2023-05-14 DIAGNOSIS — R0609 Other forms of dyspnea: Secondary | ICD-10-CM | POA: Diagnosis not present

## 2023-05-14 DIAGNOSIS — J849 Interstitial pulmonary disease, unspecified: Secondary | ICD-10-CM | POA: Diagnosis not present

## 2023-05-14 DIAGNOSIS — Z0271 Encounter for disability determination: Secondary | ICD-10-CM

## 2023-05-14 NOTE — Patient Instructions (Addendum)
 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

## 2023-05-14 NOTE — Progress Notes (Signed)
 OV 11/02/2020  Subjective:  Patient ID: Darren Wood, male , DOB: October 14, 1960 , age 63 y.o. , MRN: [id number] , ADDRESS: [address] [place] [address] PCP Millicent Sharper, MD Patient Care Team: Millicent Sharper, MD as PCP - General (Family Medicine)  This Provider for this visit: Treatment Team:  Attending Provider: Geronimo Amel, MD    11/02/2020 -   Chief Complaint  Patient presents with   Consult    Pt is being referred by Bascom Borer, NP due to SOB from covid.  Pt states he had Covid Oct 2021 and states he is still having problems with SOB that happens with activities.     HPI Darren Wood 63 y.o. -history is gained from talking to the patient and also review of the records.  He has a history of hypertension, obesity class II, dyslipidemia and DJD.  He tested positive for COVID on 02/07/2020.  He was admitted between 02/13/2020 through 02/26/2020 with COVID-19 at the [facility] health system.  He had an abnormal chest x-ray that is documented below.  He was treated with oxygen, remdesivir , steroids and tocilizumab .  He also got a short course of antibiotics for concern for aspiration pneumonia.  He also got nystatin  for thrush.  He was discharged on oxygen.   In January 2022 he had CT scan of the chest at [facility] that showed some bronchiectasis.  Elevation of right diaphragm also noted.  His ANA was negative./Borderline positive.  It is unclear to me if a sniff test was done.  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  He is also complaining of brain fog.  Review of the outside records indicat that he saw Dr. Prentice Bold [facility] pulmonary fellow as recently as May 2022.  His assessment and plan as listed below.  1. Post-COVID syndrome  2. Restrictive lung disease  3. Elevated hemidiaphragm  4. Chronic respiratory failure with hypoxia (HCC)   #1 post COVID  syndrome Resume use of Flovent 110 inhaler twice daily as directed; I reminded him that he has refills available to him through 04/24/2021 Continue use of albuterol  inhaler as needed for chest congestion, chest tightness or shortness of breath Referral to long COVID program at [place] with Dr. Karyle Schatz [phone]; [facility] [address] [place], [place] [address]  2. Restrictive lung disease 3. Elevated hemidiaphragm PFT 04/30/2020 ratio 91% FEV1 59% FVC 50% DLCO 51% Right hemidiaphragm noted and is contributing to overall dyspnea on exertion  4. Chronic respiratory failure 6-minute walk test conducted today again shows oxygen desaturation on exertion with need for 2 L/min supplemental oxygen Continue use of supplemental oxygen to maintain pulse ox readings between 88% and 93% The referral for pulmonary rehab was placed 6 to 8 weeks ago. He has not yet gotten a place in the pulmonary rehab program. He will start the pulmonary rehab as soon as he is notified.  Short-term disability form completed to have him out of work 09/21/2020 - 01/03/2021  On 10/04/2020 he visited with Bascom Borer, COVID nurse practitioner.  At this time he expressed symptoms of dyspnea with exertion, myalgias, arthralgias, memory loss.  This is despite Flovent and doing pulm rehabilitation.  It was noticed that he was still requiring 2 L with exertion.  He denied any fever chills.  Denied any nausea vomiting diarrhea.  Denied any hemoptysis.  Denied chest pain or edema.  Therefore pulmonary referral was made.  He then started pulmonary rehabilitation on 10/09/2020, he presents to the pulmonary clinic on 11/02/2020 for new consult evaluation.  He lives in [place] he and his wife state that [place] is closer.  He is happy to continue and establish with the follow-up here.  At some point he will need his disability refill.  At this point in time he is complaining of significant dyspnea on exertion along with  cognitive processing issues post-COVID.  In talking to him his Epworth sleepiness score is 10.  His wife says he snores a lot and does have apneic spells at night.  Never been tested or diagnosed with sleep apnea.  No known heart issues.  Overall course is 1 of improvement but he still feels he is 20-30% you need to make in order to feel fully optimal.  At home he uses oxygen with exertion but not at rest or walking room to room.  He was surprised that in a walking desaturation test today he did not desaturate to the point he needs oxygen.  At rehab he is using 2 L.     CXR Oct 2021  Narrative & Impression  CLINICAL DATA:  Hypoxia.  COVID positive   EXAM: PORTABLE CHEST 1 VIEW   COMPARISON:  02/13/2020   FINDINGS: Stable mild bilateral infiltrate and low lung volumes. No visible effusion or pneumothorax. Stable heart size.   IMPRESSION: Stable low volume chest with mild infiltrates.     Electronically Signed   By: Cassondra Roulette M.D.   On: 02/20/2020 07:57     Jan 2022 CT at [facility]  IMPRESSION:  There is tubular bronchiectasis throughout, worst in the bilateral  lower lobes, with peripheral irregular and ground-glass airspace  opacity, including bandlike elements with subpleural sparing. This  appearance is consistent with chronic sequelae of organizing  pneumonia and particularly is in keeping with chronic post  infectious scarring related to prior COVID 19 airspace disease.    Electronically Signed    By: Marolyn Jaksch M.D.    On: 05/16/2020 13:05   has a past medical history of Hypertension.   reports that he has never smoked. He has never used smokeless tobacco.   12/19/20- 60 yoM never smoker for sleep evaluation courtesy of Dr Piedad 11/02/20) - Dr Neysa Sleep Medical problem list includes Bronchiectasis, HTN, Covid pneumonia, June 2022, Chronic Respiratory Failure with Hypoxia, Elevated Hemidiaphragm, Hypercholesterolemia, Obesity, O2 2L used only for  exertion Epworth score-4 Body weight today-262 lbs Covid vax- Complains of snoring, tiredd, waking often, tossing and turning. No ENT surgery. No lung problems prior to Covid infection. No heart probs. No parasomnias. Says lost 40 lbs related to Covid infection.  CT chest 11/29/20- IMPRESSION: 1. Pulmonary parenchymal pattern of peripheral and basilar predominant coarsened ground-glass and bronchiectasis/bronchiolectasis, minimally improved from 05/16/2020 and in keeping with sequelae of COVID-19 pneumonia. 2. Hepatic steatosis. 3. Enlarged pulmonary arteries, indicative of pulmonary arterial hypertension.    OV 01/16/2021  Subjective:  Patient ID: Darren Wood, male , DOB: 14-Oct-1960 , age 65 y.o. , MRN: [id number] , ADDRESS: [address] [place] [address] PCP Millicent Sharper, MD Patient Care Team: Millicent Sharper, MD as PCP - General (Family Medicine)  This Provider for this visit: Treatment Team:  Attending Provider: Geronimo Amel, MD    01/16/2021 -   Chief Complaint  Patient presents with   Follow-up  Pt states he has been doing okay since last visit. States he still becomes winded when he exerts himself.   COVID long-haul follow-up  HPI Antoni Stefan 63 y.o. -presents for follow-up.  He presents with his wife.  Since last seeing me he has had his echocardiogram that probably shows diastolic dysfunction.  He had a high-resolution CT scan that shows minimal ILD changes.  He has attended pulmonary rehabilitation.  He feels this improved him.  He has seen Dr. Neysa and outpatient home sleep study is pending.  He says that since his COVID last year he continues to improve.  Every few month interval he is continue to improve.  In fact his ILD symptom score show improvement.  However he feels he is no longer able to do physical work.  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  In the past he did physical work.  Before he got COVID he was completely fine.  He was unvaccinated and had to  be hospitalized.  He feels his brain fog is also improved with a still brain fog.  He is awaiting Social Security disability and long-term disability through his employer.  He wants to be written out till end of the year which apparently already have.  He wants to come back in December 2022 to see if he is better off his disability needs to be extended.  I have emphasized to him that much is unknown about COVID long-haul but he does have to motivate himself to lose weight and get physically fit.  He is agreed to be referred for a weight loss program.       PFT   1. Left ventricular ejection fraction, by estimation, is 60 to 65%. The  left ventricle has normal function. The left ventricle has no regional  wall motion abnormalities- though technically difficult study. There is  moderate concentric left ventricular  hypertrophy. Left ventricular diastolic parameters are consistent with  Grade I diastolic dysfunction (impaired relaxation).   2. Right ventricular systolic function is normal. The right ventricular  size is normal. Tricuspid regurgitation signal is inadequate for assessing  PA pressure.   3. Left atrial size was mildly dilated.   4. The mitral valve is normal in structure. No evidence of mitral valve  regurgitation. No evidence of mitral stenosis.   5. The aortic valve is tricuspid. Aortic valve regurgitation is not  visualized. No aortic stenosis is present.   6. Aortic dilatation noted. There is mild dilatation of the ascending  aorta, measuring 40 mm.   Comparison(s): No prior Echocardiogram. Consider use of echo-contrast in  future studies.    OV 04/11/2021  Subjective:  Patient ID: Darren Wood, male , DOB: 1960/09/14 , age 22 y.o. , MRN: [id number] , ADDRESS: [address] [place] [address] PCP Millicent Sharper, MD Patient Care Team: Millicent Sharper, MD as PCP - General (Family Medicine)  This Provider for this visit: Treatment Team:  Attending Provider:  Geronimo Amel, MD    04/11/2021 -   Chief Complaint  Patient presents with   Follow-up    Surgery clearance   COVID long-haul follow-up  HPI Arel Tippen 63 y.o. -returns for follow-up.  In the interim is been approved for Social Security disability.  His employer has approved him for long-term disability.  Today I signed the form for [facility] claims.  His long-haul physical rehab exercises have been limited now because of development of hip arthritis.  He wants a hip surgery.  He is here  for preoperative clearance.  Wife also wants to know if he can take the COVID by Valent mRNA vaccine.  His COVID was over a year ago.  Was probably delta variant.  That was the dominant strain at that time.  He is also had a left-sided sciatic nerve pain flare.  In the interim his been diagnosed with sleep apnea.  He is going to start CPAP tonight.  He uses oxygen at niCT Chest data  No results found.  OV 02/20/2022  Subjective:  Patient ID: Darren Wood, male , DOB: [birth year] , age 33 y.o. , MRN: [id number] , ADDRESS: [address] [place] [address] PCP Millicent Sharper, MD Patient Care Team: Millicent Sharper, MD as PCP - General (Family Medicine)  This Provider for this visit: Treatment Team:  Attending Provider: Geronimo Amel, MD    02/20/2022 -   Chief Complaint  Patient presents with   Follow-up    Breathing good unless increased exertion.  Would like a handicap car app.  Oxygen 2L prn during day.  CPAP at bedtime.     HPI Coron Rossano 63 y.o. -returns for follow-up.  Not seen since December 202.  He states he is physically stronger he is driving right now.  He gets around with a cane.  He is doing ADLs.  Nevertheless he still has dyspnea on exertion.  Last pulmonary function test, CT scan and echocardiogram all greater than 1 year ago.  His obesity continues.  Otherwise no big issues.  He has had his flu shot.  He plans to have the RSV shot but he does not want to have the  COVID mRNA vaccine.       OV 09/05/2022  PFT Subjective:  Patient ID: Darren Wood, male , DOB: 11-10-1960 , age 75 y.o. , MRN: [id number] , ADDRESS: [address] [place] [address] PCP Millicent Sharper, MD Patient Care Team: Millicent Sharper, MD as PCP - General (Family Medicine)  This Provider for this visit: Treatment Team:  Attending Provider: Geronimo Amel, MD     09/05/2022 -follow-up post COVID interstitial lung abnormalities and dyspnea   HPI Khylin Gutridge 63 y.o. -follow-up for post-COVID dyspnea with the interstitial lung changes.  I last saw him in October 2023.  After that he had a high-resolution CT chest that showed enlarged pulmonary arteries.  We did an echo this is normal.  He has mild LVH.  He continues to have dyspnea although he states that he is able to exercise on a bike for 90 minutes but when he climbs 7 steps he can get dyspneic.  But overall he feels stable.  Pulmonary function test that is stable.  The interstitial abnormalities that I personally visualized are mild and it is also stable over time.  Pulmonary function test is stable.  We did a sit/stand test.  Made him sit and stand 10 times.  His pulse ox went from 98% to 96%.  He got slightly tachycardic and very mild shortness of breath.  He does have crackles in the lung base.  His obesity continues and he is to talk to primary care physician about starting weight loss drugs.     OV 05/14/2023  Subjective:  Patient ID: Darren Wood, male , DOB: [birth year] , age 43 y.o. , MRN: [id number] , ADDRESS: [address] [place] [address] PCP Millicent Sharper, MD Patient Care Team: Millicent Sharper, MD as PCP - General (Family Medicine)  This Provider for this visit: Treatment Team:  Attending Provider: Geronimo Amel, MD  Obesity OSA on CPAP since December 2022 follows with Dr. Reggy Salt Physical deconditioning Grade 1 diastolic dysfunction History of COVID-21 February 2020 known with  hospitalization subsequent COVID long-haul dyspnea  05/14/2023 -  No chief complaint on file.   Type of visit: Video Virtual Visit Identification of patient Alexzavier Girardin with Sep 06, 1960 and MRN [id number] - 2 person identifier Risks: Risks, benefits, limitations of telephone visit explained. Patient understood and verbalized agreement to proceed Anyone else on call: just him Patient location: his home This provider location: [address], [address]; [place]; [place] [address]. [facility]. [phone]  HPI Shaquill Iseman 63 y.o. - more mobile now. Still doe on exertion.But improved and is intermittent . For stairs is 3 of 5 and interimeeting.  O STanding for a long time bothers him because he will become tired and needing to lean onto something. Overall better No hospitalization.  He made this visit to get his disabilty forms filled out. Chart reviws suggests iform is with our adminsitrator Darilyn  Mob[employer]   - uses cane for long distance - 1/4 miles  - can sit stand x 15-20 without stopping he thinks - can do aDLS   Other issues  - 11/22?4 saw Urologist Deebel at [facility]  for high PSA/  Prostate MRI/biopsy all recommended but he is taking beta prostate supllement and diet and trying to [facility] it. PSA in Nov 2024 ws 17.667> went down to 14.36.   - Aug 2024: back pain but now resolve.d He had xrays  Chart review  - hgb 14.06 Feb 2023 [facility] - creat 0.10 Feb 2023 [facility] - LAST CT chest ot 2023 with post covd  changes - Last PFT restriction with dlco dec 2023  SYMPTOM SCALE - ILD 11/02/2020  01/16/2021  05/14/2023   O2 use ra ra   Shortness of Breath 0 -> 5 scale with 5 being worst (score 6 If unable to do)    At rest 0 0   Simple tasks - showers, clothes change, eating, shaving 2 1   Household (dishes, doing bed, laundry) 2 1   Shopping 4 2   Walking level at own pace 3 2   Walking up Stairs 3 2 3   Total (30-36) Dyspnea Score 14 8   How bad is your cough? 0 0    How bad is your fatigue 3 3   How bad is nausea 0 2   How bad is vomiting?  0 1   How bad is diarrhea? 2 0-   How bad is anxiety? 1 2   How bad is depression 1 0   Brain fog 3` 1         Simple office walk 185 feet x  3 laps goal with forehead probe 11/02/2020 - slow pace with cane  01/16/2021  09/05/2022   O2 used Ra (has o2 at  home since covid oct 2021 but this walk is RA)    Number laps completed 3  3 Sit stand x 10 times  Comments about pace Slow with cane Slow pace with cane Good pace  Resting Pulse Ox/HR 100% and 94/min 100% and 90 98%  Final Pulse Ox/HR 94% and 132/min 96% and 126/,on 96%  Desaturated </= 88% no no   Desaturated <= 3% points Yes, 6 potnt Ues, 4 ppt   Got Tachycardic >/= 90/min yes yes   Symptoms at end of test Moderate dyspnea Moderate dyspnea  Very mild dysnea  Miscellaneous comments x      PFT     Latest Ref Rng & Units 04/16/2022   10:28 AM 12/05/2020    2:45 PM  ILD indicators  FVC-Pre L 3.06  3.01   FVC-Predicted Pre % 63  72   FVC-Post L 3.12  2.99   FVC-Predicted Post % 64  72   DLCO uncorrected ml/min/mmHg 17.08  15.70   DLCO UNC %Pred % 61  55   DLCO Corrected ml/min/mmHg 17.08  16.02   DLCO COR %Pred % 61  56       LAB RESULTS last 96 hours No results found.  LAB RESULTS last 90 days No results found for this or any previous visit (from the past 2160 hours).       has a past medical history of Hyperlipidemia and Hypertension.   reports that he has never smoked. He has never used smokeless tobacco.  Past Surgical History:  Procedure Laterality Date   REVISION TOTAL HIP ARTHROPLASTY      No Known Allergies  Immunization History  Administered Date(s) Administered   Influenza,inj,Quad PF,6+ Mos 01/16/2021, 01/08/2022   Influenza,inj,Quad PF,6-35 Mos 04/21/2017   Pneumococcal Polysaccharide-23 11/22/2020   Tdap 06/05/2014    No family history on file.   Current Outpatient Medications:    acetaminophen  (TYLENOL )  500 MG tablet, Take 500 mg by mouth every 6 (six) hours as needed for fever., Disp: , Rfl:    albuterol  (VENTOLIN  HFA) 108 (90 Base) MCG/ACT inhaler, Inhale 2 puffs into the lungs every 6 (six) hours as needed for wheezing or shortness of breath., Disp: 8 g, Rfl: 12   diclofenac  Sodium (VOLTAREN ) 1 % GEL, Apply 2 g topically 4 (four) times daily., Disp: 100 g, Rfl: 0      Objective:   There were no vitals filed for this visit.  Estimated body mass index is 34.62 kg/m as calculated from the following:   Height as of 09/05/22: 6' 1 (1.854 m).   Weight as of 09/05/22: 262 lb 6.4 oz (119 kg).  @WEIGHTCHANGE @  There were no vitals filed for this visit.   Physical Exam   General: No distress. obese O2 at rest: no Cane present: no Sitting in wheel chair: no Frail: no Obese: yes Neuro: Alert and Oriented x 3. GCS 15. Speech normal Psych: Pleasant        Assessment:       ICD-10-CM   1. Post-COVID chronic dyspnea  R06.09 CT Chest High Resolution   U09.9 Pulmonary function test    2. DOE (dyspnea on exertion)  R06.09 CT Chest High Resolution    Pulmonary function test    3. ILD (interstitial lung disease) (HCC)  J84.9 CT Chest High Resolution    Pulmonary function test    4. Physical deconditioning  R53.81 CT Chest High Resolution    Pulmonary function test    5. 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    SIGNATURE    Dr. Dorethia Cave, M.D., F.C.C.P,  Pulmonary and Critical Care Medicine Staff Physician, [facility] Director - Interstitial Lung Disease  Program  Pulmonary Fibrosis [facility] [place], [place], [address]  Pager: [phone], If no answer or between  15:00h - 7:00h: call [phone] Telephone: [phone]  3:57 PM 05/14/2023

## 2023-05-15 ENCOUNTER — Ambulatory Visit: Payer: BC Managed Care – PPO | Admitting: Internal Medicine

## 2023-05-21 NOTE — Telephone Encounter (Signed)
I have partially completed the disability form, based on info from form completed in Dec 2023, and I will give to Dr. Marchelle Gearing to sign when I return to office on 1/20.

## 2023-05-27 NOTE — Telephone Encounter (Signed)
Completed disability form faxed to [facility] - fax# [phone].  Included office notes and PFT results from 05/14/2023 and 09/05/2022.  $29 paperwork fee added to account.  Hard copy mailed to patient.

## 2023-07-29 NOTE — Progress Notes (Addendum)
 HPI 38 yoM never smoker followed for OSA, complicated by Bronchiectasis(Dr Ramaswamy), HTN, Covid pneumonia, June 2022, Chronic Respiratory Failure with Hypoxia, Elevated Hemidiaphragm, Hypercholesterolemia, Obesity, O2 2L used only for exertion HST 02/21/21- AHI 44.3/ hr, desaturation to 70%, Body weight 262 lbs PFT 04/16/22-  FVC 3.12 (64%), FEV1 2.86 (78%), ratio 92, DLCOunc 17.08 (61%)  02/20/22 qualified for O2 2L HRCT-03/05/22-This chronic organizing pneumonia appearance is again specifically suggestive of chronic sequelae of prior COVID airspace disease. =================================================================================    07/29/22- 62yoM never smoker followed for OSA, complicated by Bronchiectasis(Dr Ramaswamy), HTN, Covid pneumonia/Organizing Pneumonia, June 2022, Chronic Respiratory Failure with Hypoxia, Elevated Hemidiaphragm, Hypercholesterolemia, Obesity, -Trelegy 100, Ventolin hfa, O2 2L used only for exertion CPAP auto 5-20/ [facility]  ordered 03/13/21      AirSense 11 AutoSet Download- compliance 40/%, AHI 0.9/ hr Body weight today-259 lbs Dr Bertrum Brodie for general pulmonary/ resp failure 02/20/22- Long Covid LOV Walsh, NP 04/16/22- PFT 04/16/22-  FVC 3.12 (64%), FEV1 2.86 (78%), ratio 92, DLCOunc 17.08 (61%)  Download reviewed and compliance goals again discussed. His breathing is comfortable.  He admits occasional dry cough.  Uses oxygen for exertion as needed-May be every 2 or 3 weeks.  He is not using his maintenance or rescue inhalers at all and denies significant wheeze.  Admits some dyspnea on exertion on stairs but not when riding an exercise bicycle at the gym. HRCT chest 03/05/22- IMPRESSION: 1. Unchanged appearance of the lungs with bibasilar predominant ground-glass and irregular interstitial opacity, as well as tubular bronchiectasis of the bilateral lung bases, with evidence of subpleural sparing. This chronic organizing pneumonia appearance  is again specifically suggestive of chronic sequelae of prior COVID airspace disease. 2. Enlargement of the main pulmonary artery, as can be seen in pulmonary hypertension. 3. Hepatic steatosis. Aortic Atherosclerosis (ICD10-I70.0).  07/30/23- 63yoM never smoker followed for OSA, complicated by Bronchiectasis(Dr Ramaswamy), HTN, Covid pneumonia/Organizing Pneumonia, June 2022, Chronic Respiratory Failure with Hypoxia, Elevated Hemidiaphragm, Hypercholesterolemia, Obesity, -Trelegy 100, Ventolin hfa, O2 2L used only for exertion CPAP auto 5-20/ [facility]  ordered 03/13/21      AirSense 11 AutoSet Download- compliance  Body weight today-265 lbs Dr Bertrum Brodie for general pulmonary/ resp failure 02/20/22- Long Covid- LOV 05/14/23/ video visit > plans 6 month f/u with APP to consider antifibrotic.  -----Gets sob on exertion, cpap machine is uncomfortable Discussed the use of AI scribe software for clinical note transcription with the patient, who gave verbal consent to proceed.  History of Present Illness   The patient, with a history of sleep apnea, reports intermittent use of his CPAP machine due to discomfort from the mask. He describes pressure marks from the headgear and difficulty finding a comfortable fit. Despite these issues, he acknowledges a significant improvement in sleep quality and daytime energy when using the CPAP machine. He reports sleeping through the night and feeling less tired the following day. The patient also mentions occasional forgetfulness in using the CPAP machine before sleep. Motivation and compliance goals emphasized. He has not been able to reduce his weight. He has f/u pending with Dr Bertrum Brodie.   Assessment and Plan:    Obstructive Sleep Apnea (OSA) OSA significantly improved with CPAP, reducing AHI from 44 to <1. CPAP remains optimal due to severity and weight constraints limiting surgical options. Emphasized consistent CPAP use to mitigate risks. - Coordinate with  home care company for a more comfortable CPAP mask. - Encourage consistent CPAP use to reduce OSA-related risks.  Bronchiectasis post-COVID infection Bronchiectasis managed  by Dr. Bertrum Brodie with follow-up in June. - Continue follow-up with Dr. Bertrum Brodie.          ROS-see HPI   + = positive Constitutional:    weight loss, night sweats, fevers, chills, fatigue, lassitude. HEENT:    headaches, difficulty swallowing, +tooth/dental problems, sore throat,       +sneezing, itching, ear ache, nasal congestion, post nasal drip, snoring CV:    chest pain, orthopnea, PND, swelling in lower extremities, anasarca,                                   dizziness, palpitations Resp:   +shortness of breath with exertion or at rest.                productive cough,   +non-productive cough, coughing up of blood.              change in color of mucus.  wheezing.   Skin:    rash or lesions. GI:  No-   heartburn, +indigestion, abdominal pain, nausea, vomiting, diarrhea,                 change in bowel habits, loss of appetite GU: dysuria, change in color of urine, no urgency or frequency.   flank pain. MS:   +joint pain, +stiffness, decreased range of motion, back pain. Neuro-     nothing unusual Psych:  change in mood or affect.  depression or anxiety.   memory loss.  OBJ- Physical Exam General- Alert, Oriented, Affect-appropriate, Distress- none acute, + obese  Skin- rash-none, lesions- none, excoriation- none Lymphadenopathy- none Head- atraumatic            Eyes- Gross vision intact, PERRLA, conjunctivae and secretions clear            Ears- Hearing, canals-normal            Nose- Clear, no-Septal dev, mucus, polyps, erosion, perforation             Throat- Mallampati IV , mucosa clear , drainage- none, tonsils- atrophic, + teeth Neck- flexible , trachea midline, no stridor , thyroid nl, carotid no bruit Chest - symmetrical excursion , unlabored           Heart/CV- RRR , no murmur , no gallop  , no  rub, nl s1 s2                           - JVD- none , edema- none, stasis changes- none, varices- none           Lung- +trace basilar crackles, wheeze- none, cough- none , dullness-none, rub- none           Chest wall-  Abd-  Br/ Gen/ Rectal- Not done, not indicated Extrem- Neuro- grossly intact to observation

## 2023-07-30 ENCOUNTER — Ambulatory Visit: Payer: BC Managed Care – PPO | Admitting: Internal Medicine

## 2023-07-30 ENCOUNTER — Encounter: Payer: Self-pay | Admitting: Internal Medicine

## 2023-07-30 VITALS — BP 138/80 | HR 85 | Temp 97.8°F | Ht 73.0 in | Wt 265.0 lb

## 2023-07-30 DIAGNOSIS — Z8616 Personal history of COVID-19: Secondary | ICD-10-CM

## 2023-07-30 DIAGNOSIS — J479 Bronchiectasis, uncomplicated: Secondary | ICD-10-CM | POA: Diagnosis not present

## 2023-07-30 DIAGNOSIS — G4733 Obstructive sleep apnea (adult) (pediatric): Secondary | ICD-10-CM | POA: Diagnosis not present

## 2023-07-30 NOTE — Patient Instructions (Signed)
 Order- DME [facility]- please arrange in-person visit to refit mask of choice for comfort and compliance. Continue auto 5-20, humidifier, supplies, AirView/ card.

## 2023-08-05 ENCOUNTER — Encounter: Payer: Self-pay | Admitting: Internal Medicine

## 2023-11-10 ENCOUNTER — Ambulatory Visit
Admission: RE | Admit: 2023-11-10 | Discharge: 2023-11-10 | Disposition: A | Discharge status: Home / Self Care | Source: Ambulatory Visit | Attending: Internal Medicine | Admitting: Internal Medicine

## 2023-11-10 DIAGNOSIS — R5381 Other malaise: Secondary | ICD-10-CM

## 2023-11-10 DIAGNOSIS — R0609 Other forms of dyspnea: Secondary | ICD-10-CM

## 2023-11-10 DIAGNOSIS — J849 Interstitial pulmonary disease, unspecified: Secondary | ICD-10-CM

## 2023-11-10 DIAGNOSIS — Z0271 Encounter for disability determination: Secondary | ICD-10-CM

## 2023-11-10 DIAGNOSIS — U099 Post covid-19 condition, unspecified: Secondary | ICD-10-CM

## 2023-11-21 ENCOUNTER — Ambulatory Visit: Payer: Self-pay | Admitting: Internal Medicine

## 2023-11-21 NOTE — Progress Notes (Signed)
 STable post covid CT. Please ensure followup with a nurse practitioner  Xxxxxx   Lungs/Pleura: Redemonstration of patchy areas of ground-glass attenuation and persistent subpleural linear bands of fibrosis parallel to the pleural surfaces with associated mild bronchiectasis and bronchiolectasis predominantly in peripheral zones similar to prior exam most consistent with sequela from COVID infection. No new consolidation or suspicious pulmonary nodule. No honeycombing, air trapping or emphysematous changes. No pleural effusion.   Upper Abdomen: Unremarkable.   Musculoskeletal: Mild degenerative changes of the spine. Intraosseous hemangiomas.   IMPRESSION: Stable appearance of COVID sequela throughout the lung parenchyma.     Electronically Signed   By: Megan  Zare M.D.   On: 11/12/2023 13:15

## 2024-01-13 ENCOUNTER — Telehealth: Payer: Self-pay | Admitting: *Deleted

## 2024-01-13 NOTE — Telephone Encounter (Signed)
 LMTCB pt needs spiro/dlco @ 8:30 on 9/30 before f/u with EW.

## 2024-01-13 NOTE — Telephone Encounter (Signed)
 Has been scheduled.NFN

## 2024-02-02 ENCOUNTER — Ambulatory Visit (INDEPENDENT_AMBULATORY_CARE_PROVIDER_SITE_OTHER)

## 2024-02-02 ENCOUNTER — Encounter: Payer: Self-pay | Admitting: Primary Care

## 2024-02-02 ENCOUNTER — Ambulatory Visit: Admitting: Primary Care

## 2024-02-02 VITALS — BP 126/70 | HR 54 | Temp 97.7°F | Ht 71.0 in | Wt 240.0 lb

## 2024-02-02 DIAGNOSIS — J9611 Chronic respiratory failure with hypoxia: Secondary | ICD-10-CM

## 2024-02-02 DIAGNOSIS — R0609 Other forms of dyspnea: Secondary | ICD-10-CM | POA: Diagnosis not present

## 2024-02-02 DIAGNOSIS — Z0271 Encounter for disability determination: Secondary | ICD-10-CM

## 2024-02-02 DIAGNOSIS — Z23 Encounter for immunization: Secondary | ICD-10-CM

## 2024-02-02 DIAGNOSIS — U099 Post covid-19 condition, unspecified: Secondary | ICD-10-CM | POA: Diagnosis not present

## 2024-02-02 DIAGNOSIS — R5381 Other malaise: Secondary | ICD-10-CM

## 2024-02-02 DIAGNOSIS — G4733 Obstructive sleep apnea (adult) (pediatric): Secondary | ICD-10-CM | POA: Diagnosis not present

## 2024-02-02 DIAGNOSIS — J849 Interstitial pulmonary disease, unspecified: Secondary | ICD-10-CM

## 2024-02-02 DIAGNOSIS — J8410 Pulmonary fibrosis, unspecified: Secondary | ICD-10-CM

## 2024-02-02 DIAGNOSIS — J479 Bronchiectasis, uncomplicated: Secondary | ICD-10-CM | POA: Diagnosis not present

## 2024-02-02 LAB — PULMONARY FUNCTION TEST
DL/VA % pred: 90 %
DL/VA: 3.71 ml/min/mmHg/L
DLCO unc % pred: 57 %
DLCO unc: 15.85 ml/min/mmHg
FEF 25-75 Pre: 4.47 L/s
FEF2575-%Pred-Pre: 158 %
FEV1-%Pred-Pre: 80 %
FEV1-Pre: 2.86 L
FEV1FVC-%Pred-Pre: 118 %
FEV6-%Pred-Pre: 71 %
FEV6-Pre: 3.23 L
FEV6FVC-%Pred-Pre: 105 %
FVC-%Pred-Pre: 67 %
FVC-Pre: 3.23 L
Pre FEV1/FVC ratio: 88 %
Pre FEV6/FVC Ratio: 100 %

## 2024-02-02 NOTE — Patient Instructions (Signed)
Spiro/DLCO performed today. 

## 2024-02-02 NOTE — Patient Instructions (Addendum)
  VISIT SUMMARY: You came in today for a follow-up on your severe obstructive sleep apnea and post-COVID fibrosis. We discussed your current symptoms, including your CPAP usage, shortness of breath, and mild anxiety. We also reviewed your recent weight loss and its impact on your conditions.  YOUR PLAN: -SEVERE OBSTRUCTIVE SLEEP APNEA: Severe obstructive sleep apnea is a condition where your breathing repeatedly stops and starts during sleep. Your CPAP usage has been infrequent, which increases the risk of heart problems and poor sleep quality. It's important to use your CPAP machine every night to reduce these risks. We will place an order for your to receive new CPAP supplies and you can consider alternative cleaning devices to make it easier for you to use.  -POST-COVID PULMONARY FIBROSIS WITH MILD BRONCHIECTASIS: Post-COVID pulmonary fibrosis is scarring in the lungs that occurred after a COVID-19 infection, and bronchiectasis is a condition where the airways in the lungs are abnormally widened. Your symptoms have improved, and your lung function has been stable. We will perform an annual breathing test to monitor your lung function and avoid routine CT scans unless your symptoms worsen.  INSTRUCTIONS: Please use your CPAP machine every night to improve your sleep quality and reduce health risks. We will change your CPAP supplies and consider alternative cleaning devices. Perform an annual breathing test to monitor your lung function, and avoid routine CT scans unless your symptoms worsen. We will also conduct a walk test to assess your oxygen levels during exertion. Continue to monitor your anxiety and let us  know if it worsens.  Orders: High dose flu shot  Walk test on room air   Follow-up 6 months with Landry NP / 1 year with Dr. Geronimo - 30 min ILD                           Contains text generated by [employer].

## 2024-02-02 NOTE — Progress Notes (Signed)
Spiro/DLCO performed today. 

## 2024-02-02 NOTE — Progress Notes (Signed)
 @Patient  ID: Darren Wood, male    DOB: January 17, 1961, 63 y.o.   MRN: [id number]  Chief Complaint  Patient presents with   Medical Management of Chronic Issues    PFT results.    Obstructive Sleep Apnea    CPAP F/U    Referring provider: Millicent Sharper, MD  HPI: 63 -year-old male, never smoked.  Past medical history significant for hypertension, OSA, chronic respiratory failure with hypoxia, pneumonia due to COVID-19, obesity.  Previous [facility] pulmonary encounter 02/20/2022 -   Chief Complaint  Patient presents with   Follow-up    Breathing good unless increased exertion.  Would like a handicap car app.  Oxygen 2L prn during day.  CPAP at bedtime.   Obesity OSA on CPAP since December 2022 follows with Dr. Reggy Salt Physical deconditioning Grade 1 diastolic dysfunction History of COVID-21 February 2020 known with hospitalization subsequent COVID long-haul dyspnea  Erron Wood 63 y.o. -returns for follow-up.  Not seen since December 202.  He states he is physically stronger he is driving right now.  He gets around with a cane.  He is doing ADLs.  Nevertheless he still has dyspnea on exertion.  Last pulmonary function test, CT scan and echocardiogram all greater than 1 year ago.  His obesity continues.  Otherwise no big issues.  He has had his flu shot.  He plans to have the RSV shot but he does not want to have the COVID mRNA vaccine.  04/16/2022 Patient presents today for for 2 month follow-up with PFTs. He is doing well, overall his breathing is stable. No changes since last visit. He experiences dyspnea with exertion. He joined a gym a couple of months ago. He works out 3 days a week. He needs to sit after walking a while. He is moderately compliant (60%) with CPAP use for the last 30 days, average usage 4 hours 10 minutes. He is waiting for some parts to come in for his CPAP machine. No issues with mask fit or pressure settings. He reports benefit from wearing.   Airview download  03/15/22-04/13/22 Usage 18/30 days; 53% > 4 hours Average usage 4 hours 10 mins Pressure 5-20cm h20 (13.3cm h20-95%) Airleaks 7.3L/min (95%) AHI 0.7  02/02/2024- Interim hx  Discussed the use of AI scribe software for clinical note transcription with the patient, who gave verbal consent to proceed.  History of Present Illness Darren Wood is a 63 year old male with sleep apnea and post-COVID fibrosis who presents for follow-up.  -PMH HTN, OSA, chronic respiratory failure, bronchiectasis hx covid 19 -Home sleep study October 2022 showed severe obstructive sleep apnea  -Covid long haul improved but slowly  -HRCT in July showed stable appearance of covid  -PFTs today showed mild restriction with moderate diffusion defect /  FEV1 80 with DLCO 15.85 (57%)  He is following up on his severe obstructive sleep apnea, diagnosed in October 2022 with an apnea-hypopnea index of 44.3 events per hour. He uses his CPAP machine infrequently due to discomfort and inconvenience, wearing it only 16 out of the last 90 days. He acknowledges improved sleep quality with CPAP use, describing it as 'a lot better'. Adjustments to the CPAP straps have improved comfort. His wife notes snoring on nights without CPAP use, but he does not experience gasping or choking during sleep.  He experiences shortness of breath with exertion, such as climbing stairs, rated as 3 out of 5 in severity. No shortness of breath at rest or during simple tasks, but  he rates it as 2 out of 5 when shopping. He can exercise on a bike for over an hour without issues. He has a recently developed dry cough and rates his fatigue as 2 out of 5. Mild anxiety, particularly in claustrophobic situations, is rated as 1 out of 5, and he occasionally experiences brain fog, rated as 2 out of 5.   SYMPTOM SCALE - ILD 11/02/2020  01/16/2021  04/16/2022  02/02/2024   O2 use ra ra RA RA  Shortness of Breath 0 -> 5 scale with 5 being worst (score 6 If unable to  do)     At rest 0 0 0 0  Simple tasks - showers, clothes change, eating, shaving 2 1 2  0  Household (dishes, doing bed, laundry) 2 1 2  0  Shopping 4 2 4 2   Walking level at own pace 3 2 2  0  Walking up Stairs 3 2 3 3   Total (30-36) Dyspnea Score 14 8 13 5   How bad is your cough? 0 0 0 0  How bad is your fatigue 3 3 3 2   How bad is nausea 0 2 2 0  How bad is vomiting?  0 1 0 0  How bad is diarrhea? 2 0 0 0  How bad is anxiety? 1 2 1 1   How bad is depression 1 0 0 0  Brain fog 3` 1 3 2         Simple office walk 185 feet x  3 laps goal with forehead probe 11/02/2020 - slow pace with cane   01/16/2021   09/05/2022   02/02/2024   O2 used Ra (has o2 at  home since covid oct 2021 but this walk is RA)     RA  Number laps completed 3  3 Sit stand x 10 times 3 laps   Comments about pace Slow with cane Slow pace with cane Good pace   Resting Pulse Ox/HR 100% and 94/min 100% and 90 98% 100% and 85/min  Final Pulse Ox/HR 94% and 132/min 96% and 126/,on 96% 97% and 107/min  Desaturated </= 88% no no   no  Desaturated <= 3% points Yes, 6 potnt Ues, 4 ppt   Yes, 3 points  Got Tachycardic >/= 90/min yes yes   yes  Symptoms at end of test Moderate dyspnea Moderate dyspnea Very mild dysnea Mild dyspnea  Miscellaneous comments x              Pulmonary function testing 12/06/19 PFTs>> FVC 2.99 (72%), FEV1 2.69 (84%), ratio 90, DLCOcor 16.02 (56%)   04/16/2022 PFTs >> FVC 3.12 (64%), FEV1 2.86 (78%), ratio 92, DLCOunc 17.08 (61%)  02/02/2024 PFTs>> FVC 3.23 (67%), FEV1 2.86 (80%), ratio 88, DLCOunc 15.85 (57%)   Imaging: 03/05/22 HRCT>> Unchanged appearance of the lungs with bibasilar predominant ground-glass and irregular interstitial opacity, as well as tubular bronchiectasis of the bilateral lung bases, with evidence of subpleural sparing  No Known Allergies  Immunization History  Administered Date(s) Administered   Influenza,inj,Quad PF,6+ Mos 01/16/2021, 01/08/2022   Influenza,inj,Quad  PF,6-35 Mos 04/21/2017   Pneumococcal Polysaccharide-23 11/22/2020   Tdap 06/05/2014    Past Medical History:  Diagnosis Date   Hyperlipidemia    Hypertension     Tobacco History: Social History   Tobacco Use  Smoking Status Never  Smokeless Tobacco Never   Counseling given: Not Answered   Outpatient Medications Prior to Visit  Medication Sig Dispense Refill   acetaminophen  (TYLENOL ) 500 MG tablet Take  500 mg by mouth every 6 (six) hours as needed for fever. (Patient not taking: Reported on 07/30/2023)     albuterol  (VENTOLIN  HFA) 108 (90 Base) MCG/ACT inhaler Inhale 2 puffs into the lungs every 6 (six) hours as needed for wheezing or shortness of breath. (Patient not taking: Reported on 07/30/2023) 8 g 12   atorvastatin  (LIPITOR) 20 MG tablet Take 20 mg by mouth at bedtime.     diclofenac  Sodium (VOLTAREN ) 1 % GEL Apply 2 g topically 4 (four) times daily. (Patient not taking: Reported on 07/30/2023) 100 g 0   losartan -hydrochlorothiazide (HYZAAR) 100-12.5 MG tablet Take 1 tablet by mouth daily.     No facility-administered medications prior to visit.   Review of Systems  Review of Systems  Constitutional:  Positive for fatigue.  HENT: Negative.    Respiratory: Negative.         Exertional dyspnea  Cardiovascular: Negative.    Physical Exam  There were no vitals taken for this visit. Physical Exam Constitutional:      Appearance: Normal appearance.  HENT:     Head: Normocephalic and atraumatic.  Cardiovascular:     Rate and Rhythm: Normal rate and regular rhythm.  Pulmonary:     Effort: Pulmonary effort is normal.     Breath sounds: Rales present. No wheezing or rhonchi.  Musculoskeletal:        General: Normal range of motion.  Skin:    General: Skin is warm and dry.  Neurological:     General: No focal deficit present.     Mental Status: He is alert and oriented to person, place, and time. Mental status is at baseline.  Psychiatric:        Mood and Affect:  Mood normal.        Behavior: Behavior normal.        Thought Content: Thought content normal.        Judgment: Judgment normal.     Lab Results:  CBC    Component Value Date/Time   WBC 24.4 (H) 02/24/2020 0348   RBC 4.80 02/24/2020 0348   HGB 14.0 02/24/2020 0348   HCT 42.9 02/24/2020 0348   PLT 264 02/24/2020 0348   MCV 89.4 02/24/2020 0348   MCH 29.2 02/24/2020 0348   MCHC 32.6 02/24/2020 0348   RDW 15.3 02/24/2020 0348   LYMPHSABS 1.9 02/24/2020 0348   MONOABS 1.8 (H) 02/24/2020 0348   EOSABS 0.0 02/24/2020 0348   BASOSABS 0.0 02/24/2020 0348    BMET    Component Value Date/Time   NA 136 02/26/2020 0411   K 5.0 02/26/2020 0411   CL 100 02/26/2020 0411   CO2 24 02/26/2020 0411   GLUCOSE 174 (H) 02/26/2020 0411   BUN 24 (H) 02/26/2020 0411   CREATININE 0.72 02/26/2020 0411   CALCIUM  9.1 02/26/2020 0411   GFRNONAA >60 02/26/2020 0411   GFRAA >60 11/09/2017 0952    BNP No results found for: BNP  ProBNP No results found for: PROBNP  Imaging: No results found.   Assessment & Plan:   1. Chronic respiratory failure with hypoxia (HCC) (Primary)  2. OSA (obstructive sleep apnea)  3. Post-COVID chronic dyspnea - Pulmonary Function Test; Future  Assessment and Plan Assessment & Plan Obstructive sleep apnea, severe, with suboptimal CPAP adherence Severe obstructive sleep apnea with an apnea-hypopnea index of 44.3 events per hour from a home sleep study in October 2022. CPAP adherence is suboptimal, with usage only 18% of the time over the  last 90 days. Weight loss of 20 pounds since the sleep study may have improved the condition, but apnea likely persists. CPAP use significantly improves sleep quality and reduces apnea events to less than one per hour. Non-adherence increases risk for cardiac arrhythmias, AFib, stroke, and pulmonary hypertension. Benefits of CPAP use outweigh the inconvenience, as it reduces the risk of cardiac issues and improves sleep  quality. - Encourage nightly CPAP at current pressure settings 5-20cm h20 to reduce apnea events and improve sleep quality. - Change CPAP supplies, including mask, tubing, and filter. - Wash water chamber of CPAP machine. - Consider alternative cleaning devices for CPAP if hand washing is a barrier to adherence.  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 Symptoms have improved, and ILD score has decreased from 14 to 5. Lung function tests show mild restriction with moderate diffusion defect, stable over the last two years. Existing fibrosis is unlikely to improve but has not progressed.  - Perform annual breathing test to monitor lung function. - Routine annual CT scan no longer needed unless symptoms worsen or lung function declines. - Encourage weight loss to help with exertional dyspnea.  Chronic respiratory failure with hypoxia Resolved; Patient does not require supplemental oxygen. No oxygen desaturations observed during walk test today, maintained O2 >97% RA    Almarie LELON Ferrari, NP 02/02/2024

## 2024-02-23 ENCOUNTER — Ambulatory Visit: Admitting: Internal Medicine

## 2024-04-15 ENCOUNTER — Telehealth: Payer: Self-pay | Admitting: Internal Medicine

## 2024-04-15 ENCOUNTER — Telehealth: Payer: Self-pay

## 2024-04-15 NOTE — Telephone Encounter (Signed)
 Disability paperwork was received via fax. Patient notified of turn around time via voicemail and $29 fee. Paperwork will be placed in Dr.Ramaswamy's box for completion.

## 2024-04-15 NOTE — Telephone Encounter (Signed)
 Copied from CRM #[id number]. Topic: General - Other >> Apr 15, 2024 11:11 AM Russell PARAS wrote: Reason for CRM:   Pt's wife is contacting clinic to verify if she would be able to pay the $29 processing fee for his disability paperwork over the phone. Contacted [employer], spoke with Almeda who advised once the paper work is complete they will post the payment and she can call to pay over the phone.  Wife also requested if once the forms are completed if they can be faxed back to [place], and a hard copy mailed to address on file.   CB#  [phone]   Can the $29 fee be paid over the phone or no?

## 2024-04-19 NOTE — Telephone Encounter (Signed)
 The payment has not been posed to his account yet. Once [facility] post it we can collect it. Patient was notified of this.

## 2024-04-21 NOTE — Telephone Encounter (Signed)
 Is someone able to let us  know once the pt has paid so we can fill this out?

## 2024-05-02 NOTE — Telephone Encounter (Signed)
 Copied from CRM #[id number]. Topic: General - Other >> Apr 21, 2024 11:39 AM [name] wrote: Reason for CRM: Spouse received Disability paperwork and would like to confirm if this is the same one the office received through fax or if its different.    Callback number: [phone] >> Apr 29, 2024  4:00 PM Leila BROCKS wrote: Patient's spouse Doyne [phone] is checking on patient's disability paperwork was done due to the deadline before the end of the year and wanting a hard copy. Please call back.     Any updates on paperwork completions?

## 2024-05-03 NOTE — Telephone Encounter (Signed)
 Forms were in the [facility] box up front, not MR's, so I just retrieved them when this msg was read.  MR back in the office on 05/09/24 and will ask him to complete them.  I called and make Deidra, pt's spouse aware will have MR complete them when back next wk. She verbalized understanding. MR- the forms are in green folder in your cabinet in A pod.

## 2024-05-10 NOTE — Telephone Encounter (Signed)
 MR has reviewed the forms  Please advise if you are able to complete Thanks

## 2024-05-11 NOTE — Telephone Encounter (Signed)
 Copied from CRM [id number]. Topic: General - Other >> May 10, 2024  4:54 PM Rilla B wrote: Reason for CRM: Patient's spouse Deidre [phone] is checking on patient's disability paperwork was done due to the deadline before the end of the year and wanting a hard copy. Please call Patient's cell phone  [phone]

## 2024-05-12 NOTE — Telephone Encounter (Signed)
 Copied from CRM [id number]. Topic: General - Other >> May 12, 2024  2:54 PM [name] wrote: Reason for CRM:  Patient's spouse Deidreis checking on patient's disability paperwork was done due to the deadline before the end of the year and wanting a hard copy.   Requesting to speak to someone in the office before she gets upset [phone]  Routing to [place].  Do you know the status of this FMLA paperwork?

## 2024-05-12 NOTE — Telephone Encounter (Signed)
 ed     MR has reviewed the forms  Please advise if you are able to complete Thanks

## 2024-05-13 NOTE — Telephone Encounter (Signed)
 Copied from CRM [id number]. Topic: General - Other >> May 12, 2024  2:54 PM [name] wrote: Reason for CRM:  Patient's spouse Darren Wood checking on patient's disability paperwork was done due to the deadline before the end of the year and wanting a hard copy.   Requesting to speak to someone in the office before she gets upset [phone] >> May 13, 2024  1:45 PM Darren Wood wrote: Patient is calling to request an update for his paperwork for disability. Patient is displeased that this is being dragged out as long as it has. Patient is requesting an update ASAP, he states this is now an urgent matter. Patient would like a call back to update him.  >> May 12, 2024  4:47 PM Rozanna MATSU wrote: Spouse is calling back about disability forms stated they are on a deadline to have these completed and sent in, please reach to pt or spouse about forms.

## 2024-05-16 ENCOUNTER — Telehealth: Payer: Self-pay | Admitting: *Deleted

## 2024-05-16 NOTE — Telephone Encounter (Signed)
 LM MR would like to fill out out with patient present as he needs to do an exam. He has a 9:15 on Friday 05/20/24.  Copied from CRM [id number]. Topic: General - Other >> May 16, 2024  9:34 AM LaVerne A wrote: Reason for CRM: Patient is calling again regarding his disability paperwork. At this point the due date is passed and he doesn't know what else he should do.  He missed a call because his wife has been making all the calls, but no one called him back.  Please return his call at [phone], to let him know the status of his paperwork.  They have been patient up to this point and now they are frustrated because it shouldn't take this long.  Thanks.

## 2024-05-16 NOTE — Telephone Encounter (Signed)
 Pt scheduled for rov with MR 05/19/24 to have forms completed

## 2024-05-16 NOTE — Telephone Encounter (Signed)
"   I filled it last year mid Jan 2025 - around15th.  This year due to no support in office, it wil take me a signoficant amount of time to fill. This is best if I fill in office with him. This year the form is evern longer   Also, I Can assess his disability - Can he come Friday the 05/20/24 at 9:15am. There s an asthma followup at 9am in  a 30 min slot. I Can see him at 9:15am   I did express to wife over phone that renewals are bsed on condition of patient "

## 2024-05-17 ENCOUNTER — Ambulatory Visit: Admitting: Internal Medicine

## 2024-05-17 ENCOUNTER — Encounter: Payer: Self-pay | Admitting: Internal Medicine

## 2024-05-17 VITALS — BP 136/84 | HR 89 | Ht 71.0 in | Wt 239.0 lb

## 2024-05-17 DIAGNOSIS — Z0271 Encounter for disability determination: Secondary | ICD-10-CM

## 2024-05-17 DIAGNOSIS — G4733 Obstructive sleep apnea (adult) (pediatric): Secondary | ICD-10-CM | POA: Diagnosis not present

## 2024-05-17 DIAGNOSIS — R0609 Other forms of dyspnea: Secondary | ICD-10-CM | POA: Diagnosis not present

## 2024-05-17 DIAGNOSIS — Z0289 Encounter for other administrative examinations: Secondary | ICD-10-CM

## 2024-05-17 DIAGNOSIS — R0902 Hypoxemia: Secondary | ICD-10-CM

## 2024-05-17 DIAGNOSIS — J849 Interstitial pulmonary disease, unspecified: Secondary | ICD-10-CM | POA: Diagnosis not present

## 2024-05-17 DIAGNOSIS — U099 Post covid-19 condition, unspecified: Secondary | ICD-10-CM | POA: Diagnosis not present

## 2024-05-17 NOTE — Progress Notes (Signed)
 "   @Patient  ID: Darren Wood, male    DOB: [birth year], 64 y.o.   MRN: [id number]  Chief Complaint  Patient presents with   Medical Management of Chronic Issues    PFT results.    Obstructive Sleep Apnea    CPAP F/U    Referring provider: Millicent Sharper, MD  HPI: 12 -year-old male, never smoked.  Past medical history significant for hypertension, OSA, chronic respiratory failure with hypoxia, pneumonia due to COVID-19, obesity.  Previous [facility] pulmonary encounter 02/20/2022 -   Chief Complaint  Patient presents with   Follow-up    Breathing good unless increased exertion.  Would like a handicap car app.  Oxygen 2L prn during day.  CPAP at bedtime.   Obesity OSA on CPAP since December 2022 follows with Dr. Reggy Salt Physical deconditioning Grade 1 diastolic dysfunction History of COVID-21 February 2020 known with hospitalization subsequent COVID long-haul dyspnea  Darren Wood 64 y.o. -returns for follow-up.  Not seen since December 202.  He states he is physically stronger he is driving right now.  He gets around with a cane.  He is doing ADLs.  Nevertheless he still has dyspnea on exertion.  Last pulmonary function test, CT scan and echocardiogram all greater than 1 year ago.  His obesity continues.  Otherwise no big issues.  He has had his flu shot.  He plans to have the RSV shot but he does not want to have the COVID mRNA vaccine.  04/16/2022 Patient presents today for for 2 month follow-up with PFTs. He is doing well, overall his breathing is stable. No changes since last visit. He experiences dyspnea with exertion. He joined a gym a couple of months ago. He works out 3 days a week. He needs to sit after walking a while. He is moderately compliant (60%) with CPAP use for the last 30 days, average usage 4 hours 10 minutes. He is waiting for some parts to come in for his CPAP machine. No issues with mask fit or pressure settings. He reports benefit from wearing.   Airview download  03/15/22-04/13/22 Usage 18/30 days; 53% > 4 hours Average usage 4 hours 10 mins Pressure 5-20cm h20 (13.3cm h20-95%) Airleaks 7.3L/min (95%) AHI 0.7  02/02/2024- Interim hx  Discussed the use of AI scribe software for clinical note transcription with the patient, who gave verbal consent to proceed.  History of Present Illness Darren Wood is a 64 year old male with sleep apnea and post-COVID fibrosis who presents for follow-up.  -PMH HTN, OSA, chronic respiratory failure, bronchiectasis hx covid 19 -Home sleep study October 2022 showed severe obstructive sleep apnea  -Covid long haul improved but slowly  -HRCT in July showed stable appearance of covid  -PFTs today showed mild restriction with moderate diffusion defect /  FEV1 80 with DLCO 15.85 (57%)  He is following up on his severe obstructive sleep apnea, diagnosed in October 2022 with an apnea-hypopnea index of 44.3 events per hour. He uses his CPAP machine infrequently due to discomfort and inconvenience, wearing it only 16 out of the last 90 days. He acknowledges improved sleep quality with CPAP use, describing it as 'a lot better'. Adjustments to the CPAP straps have improved comfort. His wife notes snoring on nights without CPAP use, but he does not experience gasping or choking during sleep.  He experiences shortness of breath with exertion, such as climbing stairs, rated as 3 out of 5 in severity. No shortness of breath at rest or during simple  tasks, but he rates it as 2 out of 5 when shopping. He can exercise on a bike for over an hour without issues. He has a recently developed dry cough and rates his fatigue as 2 out of 5. Mild anxiety, particularly in claustrophobic situations, is rated as 1 out of 5, and he occasionally experiences brain fog, rated as 2 out of 5.   OV 05/17/2024  Subjective:  Patient ID: Darren Wood, male , DOB: Aug 29, 1960 , age 40 y.o. , MRN: [id number] , ADDRESS: [address] [place]  [id number] PCP Millicent Sharper, MD Patient Care Team: Millicent Sharper, MD as PCP - General (Family Medicine)  This Provider for this visit: Treatment Team:  Attending Provider: Geronimo Amel, MD    05/17/2024 -   Chief Complaint  Patient presents with   Medical Management of Chronic Issues   Post Covid dyspnea     HPI Darren Wood 64 y.o. 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 Lot of smoke and dust [place] and in his area. His machine itself did not create dust. Outside of covid era empoyer di dnot make him mask. He did lot of lifting. Used to lift upto 80-100# and fee through machine and did that frewuently.   AFter above illness not able to do that now  Her for disability evaluation:employer Long Term Disability - new ise this visit   Also fu ILD   Darren Wood is a 64 year old male who presents with respiratory issues and exertional oxygen desaturation.  He experiences a drop in oxygen levels with minimal exertion and occasionally uses supplemental oxygen at night, approximately once every three to four weeks. He has not had any recent emergency room visits.  He is a former location manager at [employer], where he worked in a dusty and smoky environment due to administrator, sports activities. He was not provided with a mask outside of the COVID-19 era. His job involved lifting heavy objects, up to 80-100 pounds, which he can no longer perform due to his current health condition.  His past medical history includes high blood pressure and diabetes. No history of cancer, heart disease, or stroke. He is not currently computer savvy.   SYMPTOM SCALE - ILD 11/02/2020  01/16/2021  04/16/2022  02/02/2024  05/17/2024   O2 use ra ra RA RA ra  Shortness of Breath 0 -> 5 scale with 5 being worst (score 6 If unable to do)      At rest 0 0 0 0 0  Simple tasks - showers, clothes change, eating, shaving 2 1 2  0 0   Household (dishes, doing bed, laundry) 2 1 2  0 1  Shopping 4 2 4 2 3   Walking level at own pace 3 2 2  0 2  Walking up Stairs 3 2 3 3 2   Total (30-36) Dyspnea Score 14 8 13 5 8   How bad is your cough? 0 0 0 0 1  How bad is your fatigue 3 3 3 2 3   How bad is nausea 0 2 2 0 2  How bad is vomiting?  0 1 0 0 0  How bad is diarrhea? 2 0 0 0 1  How bad is anxiety? 1 2 1 1 1   How bad is depression 1 0 0 0 0  Brain fog 3` 1 3 2  0        Simple office walk 185 feet x  3  laps goal with forehead probe 11/02/2020 - slow pace with cane   01/16/2021   09/05/2022   02/02/2024  05/17/2024   O2 used Ra (has o2 at  home since covid oct 2021 but this walk is RA)     RA ra  Number laps completed 3  3 Sit stand x 10 times 3 laps  Sit stand x 15  Comments about pace Slow with cane Slow pace with cane Good pace    Resting Pulse Ox/HR 100% and 94/min 100% and 90 98% 100% and 85/min   Final Pulse Ox/HR 94% and 132/min 96% and 126/,on 96% 97% and 107/min   Desaturated </= 88% no no   no   Desaturated <= 3% points Yes, 6 potnt Ues, 4 ppt   Yes, 3 points   Got Tachycardic >/= 90/min yes yes   yes   Symptoms at end of test Moderate dyspnea Moderate dyspnea Very mild dysnea Mild dyspnea   Miscellaneous comments x               SIT STAND TEST - goal 15 times   05/17/2024    O2 used ra   PRobe - finter or forehead finger   Number sit and stand completed - goal 15 15   Time taken to complete 1 min ad 10 sec   Resting Pulse Ox/HR/Dyspnea  99% and 89/min and dyspnea of 7/10    Peak measures 88 % and 114/min and dyspnea of 8/10   Final Pulse Ox/HR 97% and 92/min and dyspnea of x/10   Desaturated </= 88% yes   Desaturated <= 3% points yes   Got Tachycardic >/= 90/min yes   Miscellaneous comments Dyspneic and slow      CT Chest data from date: CT July 2025  - personally visualized and independently interpreted : YES 2 dago - my findings are: as below   IMPRESSION: Stable appearance of COVID  sequela throughout the lung parenchyma.     Electronically Signed   By: Megan  Zare M.D.   On: 11/12/2023 13:15  ECHO 2023    1. Left ventricular ejection fraction, by estimation, is 60 to 65%. The  left ventricle has normal function. The left ventricle has no regional  wall motion abnormalities. Left ventricular diastolic parameters are  consistent with Grade I diastolic  dysfunction (impaired relaxation).   2. Right ventricular systolic function is normal. The right ventricular  size is normal.   3. Left atrial size was moderately dilated.   4. The mitral valve is normal in structure. No evidence of mitral valve  regurgitation. No evidence of mitral stenosis.   5. The aortic valve is normal in structure. Aortic valve regurgitation is  not visualized. No aortic stenosis is present.   6. Aneurysm of the ascending aorta, measuring 43 mm.   7. The inferior vena cava is normal in size with greater than 50%  respiratory variability, suggesting right atrial pressure of 3 mmHg.   Comparison(s): EF 60%, mod LVH.   PFT     Latest Ref Rng & Units 02/02/2024    8:06 AM 04/16/2022   10:28 AM 12/05/2020    2:45 PM  PFT Results  FVC-Pre L 3.23  3.06  3.01   FVC-Predicted Pre % 67  63  72   FVC-Post L  3.12  2.99   FVC-Predicted Post %  64  72   Pre FEV1/FVC % % 88  89  90   Post FEV1/FCV % %  92  90   FEV1-Pre L 2.86  2.72  2.71   FEV1-Predicted Pre % 80  74  84   FEV1-Post L  2.86  2.69   DLCO uncorrected ml/min/mmHg 15.85  17.08  15.70   DLCO UNC% % 57  61  55   DLCO corrected ml/min/mmHg  17.08  16.02   DLCO COR %Predicted %  61  56   DLVA Predicted % 90  94  93      [facility]  - hgb and creat - normal - alb normal (Sept 2025)  LAB RESULTS last 96 hours No results found.       has a past medical history of Hyperlipidemia and Hypertension.   reports that he has never smoked. He has never used smokeless tobacco.  Past Surgical History:   Procedure Laterality Date   REVISION TOTAL HIP ARTHROPLASTY      Allergies[1]  Immunization History  Administered Date(s) Administered   INFLUENZA, HIGH DOSE SEASONAL PF 02/02/2024   Influenza,inj,Quad PF,6+ Mos 01/16/2021, 01/08/2022   Influenza,inj,Quad PF,6-35 Mos 04/21/2017   PNEUMOCOCCAL CONJUGATE-20 02/09/2024   Pneumococcal Polysaccharide-23 11/22/2020   Respiratory Syncytial Virus Vaccine,Recomb Aduvanted(Arexvy) 03/04/2023   Tdap 06/05/2014    No family history on file.  Current Medications[2]      Objective:   Vitals:   05/17/24 0844  BP: (!) 150/84  Pulse: 89  SpO2: 99%  Weight: 239 lb (108.4 kg)  Height: 5' 11 (1.803 m)    Estimated body mass index is 33.33 kg/m as calculated from the following:   Height as of this encounter: 5' 11 (1.803 m).   Weight as of this encounter: 239 lb (108.4 kg).  @WEIGHTCHANGE @  [employer]   05/17/24 0844  Weight: 239 lb (108.4 kg)     Physical Exam   General: No distress. obes O2 at rest: no Cane present: no Sitting in wheel chair: no Frail: no Obese: yes Neuro: Alert and Oriented x 3. GCS 15. Speech normal Psych: Pleasant Resp:  Barrel Chest - no.  Wheeze - no, Crackles - no, No overt respiratory distress CVS: Normal heart sounds. Murmurs - no Ext: Stigmata of Connective Tissue Disease - no HEENT: Normal upper airway. PEERL +. No post nasal drip        Assessment/     Assessment & Plan Post-COVID chronic dyspnea  ILD (interstitial lung disease) (HCC)  Exercise hypoxemia  Disability examination  OSA (obstructive sleep apnea)    PLAN Patient Instructions     ICD-10-CM   1. Post-COVID chronic dyspnea  R06.09    U09.9     2. ILD (interstitial lung disease) (HCC)  J84.9     3. Exercise hypoxemia  R09.02     4. Disability examination  Z02.71     5. OSA (obstructive sleep apnea)  G47.33       Stable  Plan  Disabilty form filled Use o2 with exercise Use cpap at  night  Followup See Dr Theodoro for OSA 6 month spirometry and 15 min with app    FOLLOWUP    Return for See Dr Theodoro for OSA 6 month spirometry and 15 min with app.  SIGNATURE    Dr. Dorethia Cave, M.D., F.C.C.P,  Pulmonary and Critical Care Medicine Staff Physician, [facility] Director - Interstitial Lung Disease  Program  Pulmonary Fibrosis [facility] at [facility] [place], [place], [address]  Pager: [phone], If no answer or  between  15:00h - 7:00h: call [phone] Telephone: [phone]  9:16 AM 05/17/2024   Moderate Complexity MDM OFFICE  2021 E/M guidelines, first released in 2021, with minor revisions added in 2023 and 2024 Must meet the requirements for 2 out of 3 dimensions to qualify.    Number and complexity of problems addressed Amount and/or complexity of data reviewed Risk of complications and/or morbidity  One or more chronic illness with mild exacerbation, OR progression, OR  side effects of treatment  Two or more stable chronic illnesses  One undiagnosed new problem with uncertain prognosis  One acute illness with systemic symptoms   One Acute complicated injury Must meet the requirements for 1 of 3 of the categories)  Category 1: Tests and documents, historian  Any combination of 3 of the following:  Assessment requiring an independent historian  Review of prior external note(s) from each unique source  Review of results of each unique test  Ordering of each unique test    Category 2: Interpretation of tests   Independent interpretation of a test performed by another physician/other qualified health care professional (not separately reported)  Category 3: Discuss management/tests  Discussion of management or test interpretation with external physician/other qualified health care professional/appropriate source (not separately reported) Moderate risk of morbidity from additional  diagnostic testing or treatment Examples only:  Prescription drug management  Decision regarding minor surgery with identfied patient or procedure risk factors  Decision regarding elective major surgery without identified patient or procedure risk factors  Diagnosis or treatment significantly limited by social determinants of health             HIGh Complexity  OFFICE   2021 E/M guidelines, first released in 2021, with minor revisions added in 2023. Must meet the requirements for 2 out of 3 dimensions to qualify.    Number and complexity of problems addressed Amount and/or complexity of data reviewed Risk of complications and/or morbidity  Severe exacerbation of chronic illness  Acute or chronic illnesses that may pose a threat to life or bodily function, e.g., multiple trauma, acute MI, pulmonary embolus, severe respiratory distress, progressive rheumatoid arthritis, psychiatric illness with potential threat to self or others, peritonitis, acute renal failure, abrupt change in neurological status Must meet the requirements for 2 of 3 of the categories)  Category 1: Tests and documents, historian  Any combination of 3 of the following:  Assessment requiring an independent historian  Review of prior external note(s) from each unique source  Review of results of each unique test  Ordering of each unique test    Category 2: Interpretation of tests    Independent interpretation of a test performed by another physician/other qualified health care professional (not separately reported)  Category 3: Discuss management/tests  Discussion of management or test interpretation with external physician/other qualified health care professional/appropriate source (not separately reported)  HIGH risk of morbidity from additional diagnostic testing or treatment Examples only:  Drug therapy requiring intensive monitoring for toxicity  Decision for elective major surgery with  identified pateint or procedure risk factors  Decision regarding hospitalization or escalation of level of care  Decision for DNR or to de-escalate care   Parenteral controlled  substances            LEGEND - Independent interpretation involves the interpretation of a test for which there is a CPT code, and an interpretation or report is customary. When a review and interpretation of a test is  performed and documented by the provider, but not separately reported (billed), then this would represent an independent interpretation. This report does not need to conform to the usual standards of a complete report of the test. This does not include interpretation of tests that do not have formal reports such as a complete blood count with differential and blood cultures. Examples would include reviewing a chest radiograph and documenting in the medical record an interpretation, but not separately reporting (billing) the interpretation of the chest radiograph.   An appropriate source includes professionals who are not health care professionals but may be involved in the management of the patient, such as a clinical research associate, upper officer, case manager or teacher, and does not include discussion with family or informal caregivers.    - SDOH: SDOH are the conditions in the environments where people are born, live, learn, work, play, worship, and age that affect a wide range of health, functioning, and quality-of-life outcomes and risks. (e.g., housing, food insecurity, transportation, etc.). SDOH-related Z codes ranging from Z55-Z65 are the ICD-10-CM diagnosis codes used to document SDOH data Z55 - Problems related to education and literacy Z56 - Problems related to employment and unemployment Z57 - Occupational exposure to risk factors Z58 - Problems related to physical environment Z59 - Problems related to housing and economic circumstances [phone] - Problems related to social environment [phone] -  Problems related to upbringing [phone] - Other problems related to primary support group, including family circumstances Z67 - Problems related to certain psychosocial circumstances Z65 - Problems related to other psychosocial circumstances     [1] No Known Allergies [2]  Current Outpatient Medications:    acetaminophen  (TYLENOL ) 500 MG tablet, Take 500 mg by mouth every 6 (six) hours as needed for fever., Disp: , Rfl:    atorvastatin  (LIPITOR) 20 MG tablet, Take 20 mg by mouth at bedtime., Disp: , Rfl:    losartan -hydrochlorothiazide (HYZAAR) 100-12.5 MG tablet, Take 1 tablet by mouth daily., Disp: , Rfl:    albuterol  (VENTOLIN  HFA) 108 (90 Base) MCG/ACT inhaler, Inhale 2 puffs into the lungs every 6 (six) hours as needed for wheezing or shortness of breath. (Patient not taking: Reported on 05/17/2024), Disp: 8 g, Rfl: 12   diclofenac  Sodium (VOLTAREN ) 1 % GEL, Apply 2 g topically 4 (four) times daily. (Patient not taking: Reported on 05/17/2024), Disp: 100 g, Rfl: 0   metFORMIN (GLUCOPHAGE-XR) 500 MG 24 hr tablet, Take 500 mg by mouth daily., Disp: , Rfl:    MOUNJARO 5 MG/0.5ML Pen, Inject 5 mg into the skin once a week., Disp: , Rfl:   "

## 2024-05-17 NOTE — Patient Instructions (Addendum)
"    ICD-10-CM   1. Post-COVID chronic dyspnea  R06.09    U09.9     2. ILD (interstitial lung disease) (HCC)  J84.9     3. Exercise hypoxemia  R09.02     4. Disability examination  Z02.71     5. OSA (obstructive sleep apnea)  G47.33       Stable  Plan  Disabilty form filled Use o2 with exercise Use cpap at night  Followup See Dr Theodoro for OSA 6 month spirometry and 15 min with app "

## 2024-05-18 NOTE — Telephone Encounter (Signed)
 Patient was seen in clinic - [facility]

## 2024-05-19 ENCOUNTER — Ambulatory Visit: Admitting: Internal Medicine

## 2024-05-20 ENCOUNTER — Ambulatory Visit: Admitting: Internal Medicine
